# Patient Record
Sex: Female | Born: 1950
Health system: Southern US, Community
[De-identification: ages and names within clinical notes are randomized; demographics above are authoritative.]

## PROBLEM LIST (undated history)

## (undated) DIAGNOSIS — I10 Essential (primary) hypertension: Secondary | ICD-10-CM

## (undated) DIAGNOSIS — E785 Hyperlipidemia, unspecified: Secondary | ICD-10-CM

## (undated) DIAGNOSIS — I639 Cerebral infarction, unspecified: Secondary | ICD-10-CM

## (undated) DIAGNOSIS — D649 Anemia, unspecified: Secondary | ICD-10-CM

## (undated) DIAGNOSIS — R0600 Dyspnea, unspecified: Secondary | ICD-10-CM

## (undated) DIAGNOSIS — E042 Nontoxic multinodular goiter: Secondary | ICD-10-CM

## (undated) DIAGNOSIS — R600 Localized edema: Secondary | ICD-10-CM

## (undated) DIAGNOSIS — I2510 Atherosclerotic heart disease of native coronary artery without angina pectoris: Secondary | ICD-10-CM

## (undated) HISTORY — DX: Cerebral infarction, unspecified: I63.9

## (undated) HISTORY — DX: Essential (primary) hypertension: I10

## (undated) HISTORY — PX: TUBAL LIGATION: SHX77

## (undated) HISTORY — DX: Hyperlipidemia, unspecified: E78.5

---

## 2003-07-22 ENCOUNTER — Emergency Department (HOSPITAL_COMMUNITY): Admission: EM | Admit: 2003-07-22 | Discharge: 2003-07-22 | Payer: Self-pay | Admitting: Family Medicine

## 2005-05-10 HISTORY — PX: TOTAL KNEE ARTHROPLASTY: SHX125

## 2006-01-03 ENCOUNTER — Inpatient Hospital Stay (HOSPITAL_COMMUNITY): Admission: RE | Admit: 2006-01-03 | Discharge: 2006-01-07 | Payer: Self-pay | Admitting: Orthopedic Surgery

## 2009-03-09 ENCOUNTER — Observation Stay (HOSPITAL_COMMUNITY): Admission: EM | Admit: 2009-03-09 | Discharge: 2009-03-11 | Payer: Self-pay | Admitting: Emergency Medicine

## 2010-08-12 LAB — CBC
HCT: 30.1 % — ABNORMAL LOW (ref 36.0–46.0)
HCT: 32.1 % — ABNORMAL LOW (ref 36.0–46.0)
Hemoglobin: 10.6 g/dL — ABNORMAL LOW (ref 12.0–15.0)
MCHC: 35.3 g/dL (ref 30.0–36.0)
MCHC: 35.3 g/dL (ref 30.0–36.0)
MCV: 84.8 fL (ref 78.0–100.0)
MCV: 84.9 fL (ref 78.0–100.0)
Platelets: 194 10*3/uL (ref 150–400)
RBC: 3.78 MIL/uL — ABNORMAL LOW (ref 3.87–5.11)

## 2010-08-12 LAB — HEPARIN LEVEL (UNFRACTIONATED): Heparin Unfractionated: <0.1 IU/mL — ABNORMAL LOW (ref 0.30–0.70)

## 2010-08-13 LAB — HEPATIC FUNCTION PANEL
AST: 124 U/L — ABNORMAL HIGH (ref 0–37)
Albumin: 3.7 g/dL (ref 3.5–5.2)
Indirect Bilirubin: 0.5 mg/dL (ref 0.3–0.9)
Total Bilirubin: 0.7 mg/dL (ref 0.3–1.2)

## 2010-08-13 LAB — POCT I-STAT, CHEM 8
Calcium, Ion: 1.48 mmol/L — ABNORMAL HIGH (ref 1.12–1.32)
Chloride: 107 mEq/L (ref 96–112)
Creatinine, Ser: 0.9 mg/dL (ref 0.4–1.2)
Glucose, Bld: 108 mg/dL — ABNORMAL HIGH (ref 70–99)
Sodium: 142 mEq/L (ref 135–145)
TCO2: 24 mmol/L (ref 0–100)

## 2010-08-13 LAB — CARDIAC PANEL(CRET KIN+CKTOT+MB+TROPI)
Relative Index: 0.8 (ref 0.0–2.5)
Troponin I: 0.02 ng/mL (ref 0.00–0.06)

## 2010-08-13 LAB — HEPARIN LEVEL (UNFRACTIONATED): Heparin Unfractionated: 0.51 IU/mL (ref 0.30–0.70)

## 2010-08-13 LAB — POCT CARDIAC MARKERS
Myoglobin, poc: 411 ng/mL (ref 12–200)
Troponin i, poc: <0.05 ng/mL (ref 0.00–0.09)

## 2010-08-13 LAB — LIPASE, BLOOD: Lipase: 19 U/L (ref 11–59)

## 2010-08-13 LAB — LIPID PANEL
Cholesterol: 152 mg/dL (ref 0–200)
Triglycerides: 73 mg/dL (ref <150)

## 2010-09-25 NOTE — Discharge Summary (Signed)
NAMESAANYA, [name]             ACCOUNT NO.:  [id number]   MEDICAL RECORD NO.:  [id number]          PATIENT TYPE:  INP   LOCATION:  5008                         FACILITY:  [facility]   PHYSICIAN:  Loreta Ave, M.D. DATE OF BIRTH:  August 31, 1950   DATE OF ADMISSION:  01/03/2006  DATE OF DISCHARGE:  01/07/2006                                 DISCHARGE SUMMARY   FINAL DIAGNOSES:  1. Status post right total knee replacement for end stage degenerative      joint disease.  2. Hypertension, not otherwise specified.  3. Esophageal reflux disorder.  4. Obesity.   HISTORY OF PRESENT ILLNESS:  A 60 year old black female with a history of  end stage DJD right knee and chronic pain presented to our office for pre-op  evaluation for total knee replacement.  She had progressively worsening pain  with failed response with conservative treatment.  Significant decrease in  her daily activities due to the ongoing complaint.   HOSPITAL COURSE:  On January 03, 2006, the patient was taken to the [facility] operating room, and a right total knee replacement procedure was  performed.  Surgeon:  Loreta Ave, M.D.  Assistant:  Genene Churn. Denton Meek.  Anesthesia:  General.  There were no specimens.  EBL:  Minimal.  Tourniquet time:  One hour and 39 minutes.  There were no surgical or  anesthesia complications and the patient was transferred to recovery in  stable condition.  On 04 January 2006, the patient doing well with good pain control.  No  specific complaints.  Temp 99.8, pulse 99, respirations 16, blood pressure  123/70.  Hemoglobin 9.9.  Glucose 152.  INR 1.2.  Dressing clean, dry, and  intact.  Calves nontender.  Neurovascular intact distally.  Coumadin  pharmacy protocol started.  PT OT consults.  Ferrous sulfate 325 mg, one tab  p.o. b.i.d. with meals.  The patient had a normal shadow on her pre-op chest  x-ray and a chest CT ordered.  January 05, 2006, the patient doing well.  Complained of  some dizziness while  standing.  No chest pain, shortness of breath.  Vital signs stable afebrile.  Hemoglobin 8.4, hematocrit 24.3.  Glucose 141, sodium 133, potassium 4.8.  INR 1.8.  The wound looked god staples intact.  No drainage or signs of  infection.  Hemovac drain fell out over the night.  Calves nontender.  Neurovascular intact.  CT chest showed no nodules, masses, or acute  findings.  It did show an aortic and coronary artery calcifications and  thyroid goiter.  Discontinued PCA, Foley, and switched IV to Hep lock,  discontinued morphine PCA due to the patient having some dizziness.  Check a  hemoglobin A1c.  January 06, 2006, the patient doing well.  Good pain control right knee.  Over the night she was transfused 2 units of packed red blood cells due to  continued dizziness with standing.  Hemoglobin had also decreased to 7.6.  Temp 100.4, pulse 102, respirations 24, blood pressure 181/90.  INR 1.9.  Hemoglobin A1c 5.5.  CBC and B-MET pending.  Wound looked good.  Staples  intact.  No drainage or signs of infection.  Calf nontender, neurovascular  intact.  STAT UA and a urine C&S ordered.  Incentive spirometry encouraged.  Later in the day labs WBC 8.3, hemoglobin 9.8, hematocrit 28.3.  INR 1.9.  January 07, 2006, the patient doing well with good pain control.  Completed  good hall ambulation without difficulty.  No complaints of chest pain,  shortness of breath, or dizziness.  She states that she is ready to go home.  Temp 98.3, pulse 85, respirations 16, blood pressure 161/76.  WBC 6.9,  hematocrit 27.1, hemoglobin 9.6, platelets 177.  Sodium 139, potassium 4.2,  chloride 104, CO2 28, BUN 10, creatinine 0.8, glucose 101.  INR 2.1.  UA  negative.  Wound looked good.  Staples intact.  No drainage or signs of  infection.  Right calf mildly tender.  Negative Homan's.  Neurovascular  intact.  The patient ready for discharge home.   DISPOSITION:  Discharged home with family.    CONDITION:  Good and stable.   MEDICATIONS:  1. Percocet 7.5/325, one or two tabs p.o. q.4-6h. p.r.n. for pain.  2. Coumadin pharmacy protocol.  3. Robaxin 500 mg, one tab p.o. q.6 h. p.r.n. for spasm.  4. Ferrous sulfate 325 mg, one tab p.o. b.i.d. with food.   INSTRUCTIONS:  The patient will continue to work with PT and OT to improve  range of motion and strengthening of her knee.  Dressing changes daily as-  needed.  Coumadin x4 weeks post-op for DVT prophylaxis.  She will follow up  in the office two weeks post-op for recheck and possible staple removal.  Return sooner if needed.      Genene Churn. Denton Meek.      Loreta Ave, M.D.  Electronically Signed    JMO/MEDQ  D:  02/23/2006  T:  02/23/2006  Job:  [id number]

## 2010-09-25 NOTE — Op Note (Addendum)
Susan Nichols, Susan Nichols             ACCOUNT NO.:  [id number]   MEDICAL RECORD NO.:  [id number]          PATIENT TYPE:  INP   LOCATION:  2550                         FACILITY:  [facility]   PHYSICIAN:  Loreta Ave, M.D. DATE OF BIRTH:  02/13/1951   DATE OF PROCEDURE:  01/03/2006  DATE OF DISCHARGE:                                 OPERATIVE REPORT   PREOPERATIVE DIAGNOSIS:  Right knee end-stage degenerative arthritis, valgus  alignment with flexion contracture.   POSTOPERATIVE DIAGNOSIS:  Right knee end-stage degenerative arthritis,  valgus alignment with flexion contracture.   PROCEDURES:  Right total knee replacement with [employer] Triathlon  prosthesis.  Cemented #4 pegged, posterior stabilized femoral component.  Cemented #4 tibial component with a 9-mm posterior stabilized polyethylene  insert.  Cemented re-surfacing medial offset 32-mm patellar component.  Soft  tissue balancing.   SURGEON:  Loreta Ave, M.D.   ASSISTANT:  Genene Churn. Denton Meek.  Present throughout the case.   ANESTHESIA:  General.   ESTIMATED BLOOD LOSS:  Minimal.   TOURNIQUET TIME:  1 hour and 10 minutes.   SPECIMENS:  None.   CULTURES:  None.   COMPLICATIONS:  None.   DRESSING:  Self-compressive, with knee immobilizer.   DRAINS:  Hemovac x1.   PROCEDURE IN DETAIL:  The patient was brought to the operating room and  placed on the operating table in supine position.  After adequate anesthesia  had been obtained, the right knee was examined.  A 10-degree, almost 15-  degree flexion contracture.  Further flexion better than 100 degrees.  Increased valgus correctable to almost neutral mechanical axis.  Stable  ligaments.  Tethering patellofemoral joint.  Tourniquet applied, prepped and  draped in the usual sterile fashion.  Exsanguinated with elevation and  Esmarch tourniquet placed at 350 mmHg.  A straight incision above the  patella down to the tibial tubercle.  Standard medial  arthrotomy.  Her size  did not allow for a MAS approach.  The knee was exposed.  Marked grade IV  ____ QA MARKER: 88 ____ .  Ar  ligaments, all excised.  Extremely osteopenic.  Distal femoral cut,  resecting 12 mm.  Matching mechanical axis set at 5 degrees of valgus.  Sized and fitted for a #4 component with appropriate jigs.  Proximal tibial  resection, getting down to reasonable bone with appropriate resection to  overcome flexion contracture.  Resecting about 4-5 mm off the deficient  lateral side. With the extramedullary guide, 3-degree posterior slope cut.  Sized to #4 component.  All recesses cleared.  All spurs removed.  Trials  put in place, #4 in the femur, #4 in the tibia.  After soft tissue balancing  and appropriate bony resection, I had a nicely balanced knee with full  extension, full flexion, good stability, with a 9-mm insert.  The patella  was exposed, the posterior 10 mm was resected.  It was drilled and then  fitted with a 32-mm medial offset component.  With appropriate setting of  external rotation of the femoral component, I had excellent patellofemoral  tracking and good stability of patellofemoral  joint with trials.  The tibia  was marked with appropriate rotation and reamed.  All trials were removed.   Copious irrigation with the pulse irrigating device.  Cement prepared.  Placement of all components which were firmly seated.  Polyethylene attached  to the tibia.  Patellar components cemented.  Once the cement hardened, the  knee was re-examined.  Full extension, full flexion, nicely-balanced knee,  good patellofemoral tracking.  The wound was irrigated.  Hemovac placed and  brought out through a separate stab wound.  Arthrotomy closed with #1  Vicryl, skin and subcutaneous tissue with Vicryl and staples.  Knee injected  with Marcaine.  Hemovac clamped.  Sterile compressive dressing applied.  Tourniquet deflated and removed.  Knee immobilizer applied.   Anesthesia  reversed.  Brought to recovery room.  Tolerated the surgery well.  No  complications.      Loreta Ave, M.D.  Electronically Signed     DFM/MEDQ  D:  01/03/2006  T:  01/03/2006  Job:  [id number]

## 2014-03-20 ENCOUNTER — Other Ambulatory Visit (HOSPITAL_COMMUNITY): Payer: Self-pay | Admitting: Cardiovascular Disease

## 2014-03-20 DIAGNOSIS — R079 Chest pain, unspecified: Secondary | ICD-10-CM

## 2014-03-22 ENCOUNTER — Encounter (HOSPITAL_COMMUNITY)
Admission: RE | Admit: 2014-03-22 | Discharge: 2014-03-22 | Disposition: A | Discharge status: Home / Self Care | Payer: Medicare HMO | Source: Ambulatory Visit | Attending: Cardiovascular Disease | Admitting: Cardiovascular Disease

## 2014-03-22 DIAGNOSIS — R079 Chest pain, unspecified: Secondary | ICD-10-CM | POA: Diagnosis not present

## 2014-03-22 MED ORDER — REGADENOSON 0.4 MG/5ML IV SOLN
INTRAVENOUS | Status: AC
  Filled 2014-03-22: qty 5

## 2014-03-22 MED ORDER — TECHNETIUM TC 99M SESTAMIBI GENERIC - CARDIOLITE
30.0000 | Freq: Once | INTRAVENOUS | Status: AC | PRN
  Administered 2014-03-22: 30 via INTRAVENOUS

## 2014-03-22 MED ORDER — TECHNETIUM TC 99M SESTAMIBI GENERIC - CARDIOLITE
10.0000 | Freq: Once | INTRAVENOUS | Status: AC | PRN
  Administered 2014-03-22: 10 via INTRAVENOUS

## 2014-03-22 MED ORDER — REGADENOSON 0.4 MG/5ML IV SOLN
0.4000 mg | Freq: Once | INTRAVENOUS | Status: AC
  Administered 2014-03-22: 0.4 mg via INTRAVENOUS

## 2014-04-08 ENCOUNTER — Telehealth: Payer: Self-pay | Admitting: Internal Medicine

## 2014-04-08 NOTE — Telephone Encounter (Signed)
LEFT MESSAGE FOR PATIENT TO RETURN CALL TO SCHEDULE NP APPT.  °

## 2014-04-08 NOTE — Telephone Encounter (Signed)
S/W PATIENT AND GAVE NP APPT FOR 12/16 @ 11 W/DR. [place]  DX- ANEMIA  WELCOME PACKET MAILED W/CALENDAR.

## 2014-04-23 ENCOUNTER — Other Ambulatory Visit: Payer: Self-pay | Admitting: *Deleted

## 2014-04-23 DIAGNOSIS — D649 Anemia, unspecified: Secondary | ICD-10-CM

## 2014-04-24 ENCOUNTER — Ambulatory Visit: Payer: Medicare HMO

## 2014-04-24 ENCOUNTER — Other Ambulatory Visit (HOSPITAL_BASED_OUTPATIENT_CLINIC_OR_DEPARTMENT_OTHER): Payer: Medicare HMO

## 2014-04-24 ENCOUNTER — Encounter: Payer: Self-pay | Admitting: Internal Medicine

## 2014-04-24 ENCOUNTER — Ambulatory Visit (HOSPITAL_BASED_OUTPATIENT_CLINIC_OR_DEPARTMENT_OTHER): Payer: Medicare HMO | Admitting: Internal Medicine

## 2014-04-24 ENCOUNTER — Telehealth: Payer: Self-pay | Admitting: Internal Medicine

## 2014-04-24 ENCOUNTER — Other Ambulatory Visit: Payer: Self-pay | Admitting: Internal Medicine

## 2014-04-24 VITALS — BP 162/46 | HR 95 | Temp 97.5°F | Resp 22 | Ht 61.0 in | Wt 230.5 lb

## 2014-04-24 DIAGNOSIS — D539 Nutritional anemia, unspecified: Secondary | ICD-10-CM

## 2014-04-24 DIAGNOSIS — D649 Anemia, unspecified: Secondary | ICD-10-CM

## 2014-04-24 LAB — CBC & DIFF AND RETIC
BASO%: 1.2 % (ref 0.0–2.0)
BASOS ABS: 0.1 10*3/uL (ref 0.0–0.1)
EOS ABS: 0.1 10*3/uL (ref 0.0–0.5)
EOS%: 2.2 % (ref 0.0–7.0)
HCT: 34.6 % — ABNORMAL LOW (ref 34.8–46.6)
HGB: 11.3 g/dL — ABNORMAL LOW (ref 11.6–15.9)
Immature Retic Fract: 1.5 % — ABNORMAL LOW (ref 1.60–10.00)
LYMPH%: 49.8 % — AB (ref 14.0–49.7)
MCH: 27.6 pg (ref 25.1–34.0)
MCHC: 32.7 g/dL (ref 31.5–36.0)
MCV: 84.4 fL (ref 79.5–101.0)
MONO#: 0.3 10*3/uL (ref 0.1–0.9)
MONO%: 7.8 % (ref 0.0–14.0)
NEUT%: 39 % (ref 38.4–76.8)
NEUTROS ABS: 1.6 10*3/uL (ref 1.5–6.5)
Platelets: 202 10*3/uL (ref 145–400)
RBC: 4.1 10*6/uL (ref 3.70–5.45)
RDW: 14.1 % (ref 11.2–14.5)
RETIC %: 1.1 % (ref 0.70–2.10)
RETIC CT ABS: 45.1 10*3/uL (ref 33.70–90.70)
WBC: 4.1 10*3/uL (ref 3.9–10.3)
lymph#: 2 10*3/uL (ref 0.9–3.3)

## 2014-04-24 LAB — COMPREHENSIVE METABOLIC PANEL (CC13)
ALT: 13 U/L (ref 0–55)
ANION GAP: 10 meq/L (ref 3–11)
AST: 19 U/L (ref 5–34)
Albumin: 3.8 g/dL (ref 3.5–5.0)
Alkaline Phosphatase: 79 U/L (ref 40–150)
BUN: 25.2 mg/dL (ref 7.0–26.0)
CALCIUM: 11.3 mg/dL — AB (ref 8.4–10.4)
CO2: 23 meq/L (ref 22–29)
CREATININE: 1.3 mg/dL — AB (ref 0.6–1.1)
Chloride: 108 mEq/L (ref 98–109)
EGFR: 52 mL/min/{1.73_m2} — AB (ref >90)
Glucose: 102 mg/dl (ref 70–140)
Potassium: 3.9 mEq/L (ref 3.5–5.1)
Sodium: 141 mEq/L (ref 136–145)
Total Bilirubin: 0.49 mg/dL (ref 0.20–1.20)
Total Protein: 7.4 g/dL (ref 6.4–8.3)

## 2014-04-24 LAB — IRON AND TIBC CHCC
%SAT: 21 % (ref 21–57)
IRON: 54 ug/dL (ref 41–142)
TIBC: 254 ug/dL (ref 236–444)
UIBC: 200 ug/dL (ref 120–384)

## 2014-04-24 LAB — FERRITIN CHCC: Ferritin: 165 ng/ml (ref 9–269)

## 2014-04-24 LAB — LACTATE DEHYDROGENASE (CC13): LDH: 192 U/L (ref 125–245)

## 2014-04-24 NOTE — Progress Notes (Signed)
Checked in new patient with no issues prior to seeing the dr. She has appt card and no traveling.

## 2014-04-24 NOTE — Telephone Encounter (Signed)
lvm for pt regarding to Jan 2016 appt....mailed pt appt sched/avs and letter °

## 2014-04-24 NOTE — Progress Notes (Signed)
[place] [phone]   [phone]  CONSULT NOTE  REFERRING PHYSICIAN: Dr. Lucianne Lei  REASON FOR CONSULTATION:  63 years old African-American female with persistent anemia  HPI Susan Nichols is a 63 y.o. female with past medical history significant for hypertension, osteoarthritis, anxiety and long history of anemia. The patient was seen recently by her primary care physician for routine evaluation and she was complaining about feeling cold all the time. CBC on 03/10/2014 showed hemoglobin of 10.8 and hematocrit 32.6%. The patient has normal white blood count of 4.0 and normal platelets count 228,000. Iron study performed at that time showed serum iron of 62, total iron binding capacity 272 with iron saturation 27%, blasts and vitamin B12 319 and serum folate 12.9. Ferritin level was normal at 129. The patient was referred to me today for further evaluation and recommendation regarding her persistent anemia. Her last colonoscopy was performed 12 years ago and the patient is expected to have repeat colonoscopy in the next few months. She denied having any significant fatigue or weakness. She has no bleeding or bruises. She has no dizzy spells. She does not take any iron supplements.  The patient denied having any significant chest pain, shortness breath except with exertion, cough or hemoptysis. She has no weight loss or night sweats. Family history significant for a father with heart disease and hypertension, mother still alive at age 47 and has heart disease and hypertension as well as a stroke. The patient is married and has 2 children. She used to work at [employer]. She is currently on disability. She has no history of smoking, alcohol or drug abuse. HPI  Past Medical History  Diagnosis Date  . Hypertension   . Hyperlipemia     Past Surgical History  Procedure Laterality Date  . Total knee arthroplasty  2007    right knee     History reviewed. No pertinent family history.  Social History History  Substance Use Topics  . Smoking status: Never Smoker   . Smokeless tobacco: Not on file  . Alcohol Use: No    No Known Allergies  Current Outpatient Prescriptions  Medication Sig Dispense Refill  . ALPRAZolam (XANAX) 0.25 MG tablet Take 1 tablet by mouth daily as needed.  3  . amLODipine (NORVASC) 10 MG tablet Take 10 mg by mouth daily.  3  . atenolol (TENORMIN) 50 MG tablet Take 50 mg by mouth daily.  4  . BIDIL 20-37.5 MG per tablet Take 1 tablet by mouth daily.  6  . cloNIDine (CATAPRES) 0.2 MG tablet Take 0.2 mg by mouth daily.  6  . furosemide (LASIX) 20 MG tablet Take 20 mg by mouth. unsure of dose    . HYDROcodone-acetaminophen (NORCO/VICODIN) 5-325 MG per tablet Take 1 tablet by mouth.  0  . lisinopril (PRINIVIL,ZESTRIL) 20 MG tablet Take 20 mg by mouth daily.  6  . potassium chloride (MICRO-K) 10 MEQ CR capsule Take 10 mEq by mouth daily.  2  . simvastatin (ZOCOR) 20 MG tablet Take 20 mg by mouth daily.     No current facility-administered medications for this visit.    Review of Systems  Constitutional: negative Eyes: negative Ears, nose, mouth, throat, and face: negative Respiratory: negative Cardiovascular: negative Gastrointestinal: negative Genitourinary:negative Integument/breast: negative Hematologic/lymphatic: negative Musculoskeletal:negative Neurological: negative Behavioral/Psych: negative Endocrine: negative Allergic/Immunologic: negative  Physical Exam  UYE:BXIDH, healthy, no distress, well nourished and well developed SKIN: skin color, texture, turgor are  normal, no rashes or significant lesions HEAD: Normocephalic, No masses, lesions, tenderness or abnormalities EYES: normal, PERRLA EARS: External ears normal, Canals clear OROPHARYNX:no exudate, no erythema and lips, buccal mucosa, and tongue normal  NECK: supple, no adenopathy, no JVD LYMPH:  no palpable  lymphadenopathy, no hepatosplenomegaly BREAST:not examined LUNGS: clear to auscultation , and palpation HEART: regular rate & rhythm and no murmurs ABDOMEN:abdomen soft, non-tender, normal bowel sounds and no masses or organomegaly BACK: Back symmetric, no curvature., No CVA tenderness EXTREMITIES:no joint deformities, effusion, or inflammation, no edema, no skin discoloration  NEURO: alert & oriented x 3 with fluent speech, no focal motor/sensory deficits  PERFORMANCE STATUS: ECOG 1  LABORATORY DATA: Lab Results  Component Value Date   WBC 4.1 04/24/2014   HGB 11.3* 04/24/2014   HCT 34.6* 04/24/2014   MCV 84.4 04/24/2014   PLT 202 04/24/2014      Chemistry      Component Value Date/Time   NA 142 03/09/2009 0533   K 3.7 03/09/2009 0533   CL 107 03/09/2009 0533   BUN 19 03/09/2009 0533   CREATININE 0.9 03/09/2009 0533      Component Value Date/Time   ALKPHOS 112 03/09/2009 0625   AST 124* 03/09/2009 0625   ALT 58* 03/09/2009 0625   BILITOT 0.7 03/09/2009 0625       RADIOGRAPHIC STUDIES: No results found.  ASSESSMENT: This is a very pleasant 63 years old African-American female with persistent mild anemia of unclear etiology likely secondary to nutritional deficiency versus myelodysplasia   PLAN: I had a lengthy discussion with the patient today about her current condition and further investigation to confirm the diagnosis. I'll order several studies including repeat CBC, comprehensive metabolic panel, LDH, serum ferritin, iron study, serum folate, serum vitamin B12 level, serum erythropoietin as well as serum protein electrophoreses. I will see the patient back for follow-up visit in 3 weeks for reevaluation and discussion of her lab results. If no clear etiology proceeding with a bone marrow biopsy and aspirate versus continuous monitoring and close observation. The patient agreed to the current plan.  The patient voices understanding of current disease status and  treatment options and is in agreement with the current care plan.  All questions were answered. The patient knows to call the clinic with any problems, questions or concerns. We can certainly see the patient much sooner if necessary.  Thank you so much for allowing me to participate in the care of [facility]. I will continue to follow up the patient with you and assist in her care.  I spent 40 minutes counseling the patient face to face. The total time spent in the appointment was 55 minutes.  Disclaimer: This note was dictated with voice recognition software. Similar sounding words can inadvertently be transcribed and may not be corrected upon review.   Emanuell Morina K. 04/24/2014, 12:21 PM

## 2014-04-26 LAB — VITAMIN B12: Vitamin B-12: 388 pg/mL (ref 211–911)

## 2014-04-26 LAB — PROTEIN ELECTROPHORESIS, SERUM, WITH REFLEX
ALBUMIN ELP: 52.2 % — AB (ref 55.8–66.1)
Alpha-1-Globulin: 5.2 % — ABNORMAL HIGH (ref 2.9–4.9)
Alpha-2-Globulin: 11.2 % (ref 7.1–11.8)
BETA 2: 6.6 % — AB (ref 3.2–6.5)
BETA GLOBULIN: 5.6 % (ref 4.7–7.2)
Gamma Globulin: 19.2 % — ABNORMAL HIGH (ref 11.1–18.8)
Total Protein, Serum Electrophoresis: 7.7 g/dL (ref 6.0–8.3)

## 2014-04-26 LAB — ERYTHROPOIETIN: ERYTHROPOIETIN: 15 m[IU]/mL (ref 2.6–18.5)

## 2014-04-26 LAB — FOLATE RBC: RBC Folate: 576 ng/mL (ref >280)

## 2014-05-15 ENCOUNTER — Ambulatory Visit: Payer: Medicare HMO | Admitting: Internal Medicine

## 2014-05-15 ENCOUNTER — Other Ambulatory Visit: Payer: Medicare HMO

## 2014-05-17 ENCOUNTER — Other Ambulatory Visit: Payer: Self-pay | Admitting: *Deleted

## 2014-05-17 ENCOUNTER — Telehealth: Payer: Self-pay | Admitting: Internal Medicine

## 2014-05-17 NOTE — Telephone Encounter (Signed)
lvm fo rpt regarding to Jan appt....mailed pt appt sched and letter °

## 2014-06-06 ENCOUNTER — Telehealth: Payer: Self-pay | Admitting: Internal Medicine

## 2014-06-06 ENCOUNTER — Ambulatory Visit (HOSPITAL_BASED_OUTPATIENT_CLINIC_OR_DEPARTMENT_OTHER): Payer: Commercial Managed Care - HMO | Admitting: Internal Medicine

## 2014-06-06 ENCOUNTER — Other Ambulatory Visit (HOSPITAL_BASED_OUTPATIENT_CLINIC_OR_DEPARTMENT_OTHER): Payer: Commercial Managed Care - HMO

## 2014-06-06 ENCOUNTER — Encounter: Payer: Self-pay | Admitting: Internal Medicine

## 2014-06-06 VITALS — BP 184/52 | HR 81 | Temp 98.1°F | Resp 18 | Ht 61.0 in | Wt 227.9 lb

## 2014-06-06 DIAGNOSIS — I10 Essential (primary) hypertension: Secondary | ICD-10-CM | POA: Diagnosis not present

## 2014-06-06 DIAGNOSIS — D649 Anemia, unspecified: Secondary | ICD-10-CM

## 2014-06-06 DIAGNOSIS — D539 Nutritional anemia, unspecified: Secondary | ICD-10-CM

## 2014-06-06 LAB — CBC WITH DIFFERENTIAL/PLATELET
BASO%: 1.9 % (ref 0.0–2.0)
Basophils Absolute: 0.1 10e3/uL (ref 0.0–0.1)
EOS%: 2.2 % (ref 0.0–7.0)
Eosinophils Absolute: 0.1 10e3/uL (ref 0.0–0.5)
HCT: 35.5 % (ref 34.8–46.6)
HGB: 11.4 g/dL — ABNORMAL LOW (ref 11.6–15.9)
LYMPH%: 38.2 % (ref 14.0–49.7)
MCH: 27.2 pg (ref 25.1–34.0)
MCHC: 32.2 g/dL (ref 31.5–36.0)
MCV: 84.4 fL (ref 79.5–101.0)
MONO#: 0.6 10e3/uL (ref 0.1–0.9)
MONO%: 10.4 % (ref 0.0–14.0)
NEUT#: 2.5 10e3/uL (ref 1.5–6.5)
NEUT%: 47.3 % (ref 38.4–76.8)
Platelets: 249 10e3/uL (ref 145–400)
RBC: 4.2 10e6/uL (ref 3.70–5.45)
RDW: 14.9 % — ABNORMAL HIGH (ref 11.2–14.5)
WBC: 5.3 10e3/uL (ref 3.9–10.3)
lymph#: 2 10e3/uL (ref 0.9–3.3)

## 2014-06-06 NOTE — Telephone Encounter (Signed)
, °

## 2014-06-06 NOTE — Progress Notes (Signed)
[place] Telephone:([phone]   Fax:(336) [place], [place] [address] [place] [address]  DIAGNOSIS: Mild anemia patient for anemia of chronic disease plus/minus iron deficiency  PRIOR THERAPY: None  CURRENT THERAPY:  INTERVAL HISTORY: Susan Nichols 65 y.o. female returns to the clinic today for follow-up visit. The patient was seen a few weeks ago for evaluation of her mild anemia. I had several studies performed at that time including serum erythropoietin that was normal at 15.0, serum folate 576, vitamin B 12 level 388. Serum protein electrophoreses showed no specific abnormality. Serum ferritin was 165, serum iron 54, total iron binding capacity 254 and iron sat induration 21%. The patient is feeling fine today except for mild fatigue. She denied having any significant weight loss or night sweats. She denied having any chest pain, shortness breath, cough or hemoptysis. She had repeat CBC and she is here for evaluation and discussion of her lab results. She is currently taking over-the-counter iron tablets.   MEDICAL HISTORY: Past Medical History  Diagnosis Date  . Hypertension   . Hyperlipemia     ALLERGIES:  has No Known Allergies.  MEDICATIONS:  Current Outpatient Prescriptions  Medication Sig Dispense Refill  . ALPRAZolam (XANAX) 0.25 MG tablet Take 1 tablet by mouth daily as needed.  3  . amLODipine (NORVASC) 10 MG tablet Take 10 mg by mouth daily.  3  . atenolol (TENORMIN) 50 MG tablet Take 50 mg by mouth daily.  4  . cloNIDine (CATAPRES) 0.2 MG tablet Take 0.2 mg by mouth daily.  6  . furosemide (LASIX) 20 MG tablet Take 20 mg by mouth. unsure of dose    . HYDROcodone-acetaminophen (NORCO/VICODIN) 5-325 MG per tablet Take 1 tablet by mouth.  0  . lisinopril (PRINIVIL,ZESTRIL) 20 MG tablet Take 20 mg by mouth daily.  6  . potassium chloride (MICRO-K) 10 MEQ CR capsule Take 10 mEq by mouth daily.  2    . simvastatin (ZOCOR) 20 MG tablet Take 20 mg by mouth daily.    Marland Kitchen BIDIL 20-37.5 MG per tablet Take 1 tablet by mouth daily.  6  . chlorthalidone (HYGROTON) 25 MG tablet Take 25 mg by mouth daily.  1   No current facility-administered medications for this visit.    SURGICAL HISTORY:  Past Surgical History  Procedure Laterality Date  . Total knee arthroplasty  2007    right knee    REVIEW OF SYSTEMS:  A comprehensive review of systems was negative.   PHYSICAL EXAMINATION: General appearance: alert, cooperative and no distress Head: Normocephalic, without obvious abnormality, atraumatic Neck: no adenopathy, no JVD, supple, symmetrical, trachea midline and thyroid not enlarged, symmetric, no tenderness/mass/nodules Lymph nodes: Cervical, supraclavicular, and axillary nodes normal. Resp: clear to auscultation bilaterally Back: symmetric, no curvature. ROM normal. No CVA tenderness. Cardio: regular rate and rhythm, S1, S2 normal, no murmur, click, rub or gallop GI: soft, non-tender; bowel sounds normal; no masses,  no organomegaly Extremities: extremities normal, atraumatic, no cyanosis or edema  ECOG PERFORMANCE STATUS: 1 - Symptomatic but completely ambulatory  Blood pressure 184/52, pulse 81, temperature 98.1 F (36.7 C), temperature source Oral, resp. rate 18, height 5\' 1"  (1.549 m), weight 227 lb 14.4 oz (103.375 kg), SpO2 100 %.  LABORATORY DATA: Lab Results  Component Value Date   WBC 5.3 06/06/2014   HGB 11.4* 06/06/2014   HCT 35.5 06/06/2014   MCV 84.4  06/06/2014   PLT 249 06/06/2014      Chemistry      Component Value Date/Time   NA 141 04/24/2014 1115   NA 142 03/09/2009 0533   K 3.9 04/24/2014 1115   K 3.7 03/09/2009 0533   CL 107 03/09/2009 0533   CO2 23 04/24/2014 1115   BUN 25.2 04/24/2014 1115   BUN 19 03/09/2009 0533   CREATININE 1.3* 04/24/2014 1115   CREATININE 0.9 03/09/2009 0533      Component Value Date/Time   CALCIUM 11.3* 04/24/2014 1115    ALKPHOS 79 04/24/2014 1115   ALKPHOS 112 03/09/2009 0625   AST 19 04/24/2014 1115   AST 124* 03/09/2009 0625   ALT 13 04/24/2014 1115   ALT 58* 03/09/2009 0625   BILITOT 0.49 04/24/2014 1115   BILITOT 0.7 03/09/2009 0625       RADIOGRAPHIC STUDIES: No results found.  ASSESSMENT AND PLAN: This is a very pleasant 64 years old African-American female with mild anemia most likely anemia of chronic disease plus/minus iron deficiency. The patient is feeling fine today and her hemoglobin is 11.4. I recommended for her to continue on oral iron tablets for now. I will continue to monitor her closely with repeat CBC, iron study and ferritin in 3 months. For hypertension, the patient was very anxious today and did not take her blood pressure medicine this morning. I strongly recommended for her to take her blood pressure medication as soon as she goes home and also to contact her primary care physician for evaluation and adjustment of her antihypertensive medication. The patient was advised to call immediately if she has any concerning symptoms in the interval. The patient voices understanding of current disease status and treatment options and is in agreement with the current care plan.  All questions were answered. The patient knows to call the clinic with any problems, questions or concerns. We can certainly see the patient much sooner if necessary.  Disclaimer: This note was dictated with voice recognition software. Similar sounding words can inadvertently be transcribed and may not be corrected upon review.

## 2014-07-18 DIAGNOSIS — E784 Other hyperlipidemia: Secondary | ICD-10-CM | POA: Diagnosis not present

## 2014-07-18 DIAGNOSIS — I10 Essential (primary) hypertension: Secondary | ICD-10-CM | POA: Diagnosis not present

## 2014-07-18 DIAGNOSIS — F419 Anxiety disorder, unspecified: Secondary | ICD-10-CM | POA: Diagnosis not present

## 2014-07-18 DIAGNOSIS — E669 Obesity, unspecified: Secondary | ICD-10-CM | POA: Diagnosis not present

## 2014-08-06 DIAGNOSIS — D649 Anemia, unspecified: Secondary | ICD-10-CM | POA: Diagnosis not present

## 2014-08-06 DIAGNOSIS — I10 Essential (primary) hypertension: Secondary | ICD-10-CM | POA: Diagnosis not present

## 2014-08-06 DIAGNOSIS — E780 Pure hypercholesterolemia: Secondary | ICD-10-CM | POA: Diagnosis not present

## 2014-08-06 DIAGNOSIS — M1711 Unilateral primary osteoarthritis, right knee: Secondary | ICD-10-CM | POA: Diagnosis not present

## 2014-09-04 ENCOUNTER — Other Ambulatory Visit (HOSPITAL_BASED_OUTPATIENT_CLINIC_OR_DEPARTMENT_OTHER): Payer: Commercial Managed Care - HMO

## 2014-09-04 DIAGNOSIS — D649 Anemia, unspecified: Secondary | ICD-10-CM | POA: Diagnosis not present

## 2014-09-04 DIAGNOSIS — D539 Nutritional anemia, unspecified: Secondary | ICD-10-CM

## 2014-09-04 LAB — CBC WITH DIFFERENTIAL/PLATELET
BASO%: 1.1 % (ref 0.0–2.0)
BASOS ABS: 0.1 10*3/uL (ref 0.0–0.1)
EOS%: 2.1 % (ref 0.0–7.0)
Eosinophils Absolute: 0.1 10*3/uL (ref 0.0–0.5)
HCT: 33.7 % — ABNORMAL LOW (ref 34.8–46.6)
HEMOGLOBIN: 11.1 g/dL — AB (ref 11.6–15.9)
LYMPH%: 35.3 % (ref 14.0–49.7)
MCH: 27.9 pg (ref 25.1–34.0)
MCHC: 32.9 g/dL (ref 31.5–36.0)
MCV: 84.7 fL (ref 79.5–101.0)
MONO#: 0.5 10*3/uL (ref 0.1–0.9)
MONO%: 9.5 % (ref 0.0–14.0)
NEUT#: 2.7 10*3/uL (ref 1.5–6.5)
NEUT%: 52 % (ref 38.4–76.8)
PLATELETS: 208 10*3/uL (ref 145–400)
RBC: 3.98 10*6/uL (ref 3.70–5.45)
RDW: 13.8 % (ref 11.2–14.5)
WBC: 5.3 10*3/uL (ref 3.9–10.3)
lymph#: 1.9 10*3/uL (ref 0.9–3.3)

## 2014-09-04 LAB — IRON AND TIBC CHCC
%SAT: 17 % — ABNORMAL LOW (ref 21–57)
IRON: 40 ug/dL — AB (ref 41–142)
TIBC: 234 ug/dL — ABNORMAL LOW (ref 236–444)
UIBC: 194 ug/dL (ref 120–384)

## 2014-09-04 LAB — FERRITIN CHCC: Ferritin: 139 ng/ml (ref 9–269)

## 2014-09-10 DIAGNOSIS — D649 Anemia, unspecified: Secondary | ICD-10-CM | POA: Diagnosis not present

## 2014-09-10 DIAGNOSIS — E785 Hyperlipidemia, unspecified: Secondary | ICD-10-CM | POA: Diagnosis not present

## 2014-09-10 DIAGNOSIS — M1711 Unilateral primary osteoarthritis, right knee: Secondary | ICD-10-CM | POA: Diagnosis not present

## 2014-09-10 DIAGNOSIS — I10 Essential (primary) hypertension: Secondary | ICD-10-CM | POA: Diagnosis not present

## 2014-09-11 ENCOUNTER — Telehealth: Payer: Self-pay | Admitting: Internal Medicine

## 2014-09-11 ENCOUNTER — Encounter: Payer: Self-pay | Admitting: Internal Medicine

## 2014-09-11 ENCOUNTER — Ambulatory Visit (HOSPITAL_BASED_OUTPATIENT_CLINIC_OR_DEPARTMENT_OTHER): Payer: Commercial Managed Care - HMO | Admitting: Internal Medicine

## 2014-09-11 VITALS — BP 189/53 | HR 96 | Temp 98.4°F | Resp 18 | Ht 61.0 in | Wt 229.7 lb

## 2014-09-11 DIAGNOSIS — D539 Nutritional anemia, unspecified: Secondary | ICD-10-CM

## 2014-09-11 DIAGNOSIS — D509 Iron deficiency anemia, unspecified: Secondary | ICD-10-CM | POA: Diagnosis not present

## 2014-09-11 NOTE — Progress Notes (Signed)
[facility] [phone]   [phone]  OFFICE PROGRESS NOTE  Elyn Peers, MD [address] [place] [address]  DIAGNOSIS: Mild anemia patient for anemia of chronic disease plus/minus iron deficiency  PRIOR THERAPY: None  CURRENT THERAPY: Over-the-counter Oral iron tablets once a day.  INTERVAL HISTORY: Susan Nichols 64 y.o. female returns to the clinic today for follow-up visit. The patient is feeling fine today except for mild fatigue. She denied having any significant weight loss or night sweats. She denied having any chest pain, shortness breath, cough or hemoptysis. She had repeat CBC, iron study and ferritin and she is here for evaluation and discussion of her lab results. She is currently taking over-the-counter iron tablets, only once a day.    MEDICAL HISTORY: Past Medical History  Diagnosis Date  . Hypertension   . Hyperlipemia     ALLERGIES:  has No Known Allergies.  MEDICATIONS:  Current Outpatient Prescriptions  Medication Sig Dispense Refill  . ALPRAZolam (XANAX) 0.25 MG tablet Take 1 tablet by mouth daily as needed.  3  . amLODipine (NORVASC) 10 MG tablet Take 10 mg by mouth daily.  3  . atenolol (TENORMIN) 50 MG tablet Take 50 mg by mouth daily.  4  . BIDIL 20-37.5 MG per tablet Take 1 tablet by mouth daily.  6  . chlorthalidone (HYGROTON) 25 MG tablet Take 25 mg by mouth daily.  1  . cloNIDine (CATAPRES) 0.2 MG tablet Take 0.2 mg by mouth daily.  6  . furosemide (LASIX) 20 MG tablet Take 20 mg by mouth. unsure of dose    . HYDROcodone-acetaminophen (NORCO/VICODIN) 5-325 MG per tablet Take 1 tablet by mouth.  0  . lisinopril (PRINIVIL,ZESTRIL) 20 MG tablet Take 20 mg by mouth daily.  6  . potassium chloride (MICRO-K) 10 MEQ CR capsule Take 10 mEq by mouth daily.  2  . simvastatin (ZOCOR) 20 MG tablet Take 20 mg by mouth daily.    . simvastatin (ZOCOR) 40 MG tablet   3   No current facility-administered  medications for this visit.    SURGICAL HISTORY:  Past Surgical History  Procedure Laterality Date  . Total knee arthroplasty  2007    right knee    REVIEW OF SYSTEMS:  A comprehensive review of systems was negative except for: Constitutional: positive for fatigue   PHYSICAL EXAMINATION: General appearance: alert, cooperative and no distress Head: Normocephalic, without obvious abnormality, atraumatic Neck: no adenopathy, no JVD, supple, symmetrical, trachea midline and thyroid not enlarged, symmetric, no tenderness/mass/nodules Lymph nodes: Cervical, supraclavicular, and axillary nodes normal. Resp: clear to auscultation bilaterally Back: symmetric, no curvature. ROM normal. No CVA tenderness. Cardio: regular rate and rhythm, S1, S2 normal, no murmur, click, rub or gallop GI: soft, non-tender; bowel sounds normal; no masses,  no organomegaly Extremities: extremities normal, atraumatic, no cyanosis or edema  ECOG PERFORMANCE STATUS: 1 - Symptomatic but completely ambulatory  Blood pressure 189/53, pulse 96, temperature 98.4 F (36.9 C), temperature source Oral, resp. rate 18, height 5\' 1"  (1.549 m), weight 229 lb 11.2 oz (104.191 kg), SpO2 99 %.  LABORATORY DATA: Lab Results  Component Value Date   WBC 5.3 09/04/2014   HGB 11.1* 09/04/2014   HCT 33.7* 09/04/2014   MCV 84.7 09/04/2014   PLT 208 09/04/2014      Chemistry      Component Value Date/Time   NA 141 04/24/2014 1115   NA 142 03/09/2009  0533   K 3.9 04/24/2014 1115   K 3.7 03/09/2009 0533   CL 107 03/09/2009 0533   CO2 23 04/24/2014 1115   BUN 25.2 04/24/2014 1115   BUN 19 03/09/2009 0533   CREATININE 1.3* 04/24/2014 1115   CREATININE 0.9 03/09/2009 0533      Component Value Date/Time   CALCIUM 11.3* 04/24/2014 1115   ALKPHOS 79 04/24/2014 1115   ALKPHOS 112 03/09/2009 0625   AST 19 04/24/2014 1115   AST 124* 03/09/2009 0625   ALT 13 04/24/2014 1115   ALT 58* 03/09/2009 0625   BILITOT 0.49 04/24/2014  1115   BILITOT 0.7 03/09/2009 0625       RADIOGRAPHIC STUDIES: No results found.  ASSESSMENT AND PLAN: This is a very pleasant 64 years old African-American female with mild anemia most likely anemia of chronic disease plus/minus iron deficiency. The patient is feeling fine today and her hemoglobin is 11.1. The iron study also showed decrease in the serum iron and iron saturation. I had a lengthy discussion with the patient today about her condition. I gave her the option of continuing oral iron tablets with ferrous sulfate but increase the dose to 3 times a day with vitamin C or orange juice. I also strongly recommended for the patient to avoid drinking tea when she takes her iron tablets to avoid malabsorption of the iron. I also gave her the option of proceeding with intravenous iron infusion was Feraheme. The patient would like to continue on the oral iron tablets for now. I will continue to monitor her closely with repeat CBC, iron study and ferritin in 3 months. The patient was advised to call immediately if she has any concerning symptoms in the interval. The patient voices understanding of current disease status and treatment options and is in agreement with the current care plan.  All questions were answered. The patient knows to call the clinic with any problems, questions or concerns. We can certainly see the patient much sooner if necessary.  Disclaimer: This note was dictated with voice recognition software. Similar sounding words can inadvertently be transcribed and may not be corrected upon review.

## 2014-09-11 NOTE — Telephone Encounter (Signed)
per pof to sch pt appt-gave pt copy of sch °

## 2014-09-17 DIAGNOSIS — I10 Essential (primary) hypertension: Secondary | ICD-10-CM | POA: Diagnosis not present

## 2014-09-17 DIAGNOSIS — E784 Other hyperlipidemia: Secondary | ICD-10-CM | POA: Diagnosis not present

## 2014-09-17 DIAGNOSIS — E559 Vitamin D deficiency, unspecified: Secondary | ICD-10-CM | POA: Diagnosis not present

## 2014-09-17 DIAGNOSIS — E6609 Other obesity due to excess calories: Secondary | ICD-10-CM | POA: Diagnosis not present

## 2014-10-17 DIAGNOSIS — M25561 Pain in right knee: Secondary | ICD-10-CM | POA: Diagnosis not present

## 2014-10-17 DIAGNOSIS — I10 Essential (primary) hypertension: Secondary | ICD-10-CM | POA: Diagnosis not present

## 2014-10-17 DIAGNOSIS — E784 Other hyperlipidemia: Secondary | ICD-10-CM | POA: Diagnosis not present

## 2014-10-17 DIAGNOSIS — E6609 Other obesity due to excess calories: Secondary | ICD-10-CM | POA: Diagnosis not present

## 2014-12-06 ENCOUNTER — Telehealth: Payer: Self-pay | Admitting: Internal Medicine

## 2014-12-06 NOTE — Telephone Encounter (Signed)
returned call and cx lab on 8.1.Marland KitchenMarland Kitchenpt does not feel she needs to come here...she will see pcp on 8.2 and will r/s is needed

## 2014-12-09 ENCOUNTER — Other Ambulatory Visit: Payer: Commercial Managed Care - HMO

## 2014-12-10 DIAGNOSIS — M1711 Unilateral primary osteoarthritis, right knee: Secondary | ICD-10-CM | POA: Diagnosis not present

## 2014-12-10 DIAGNOSIS — E559 Vitamin D deficiency, unspecified: Secondary | ICD-10-CM | POA: Diagnosis not present

## 2014-12-10 DIAGNOSIS — I10 Essential (primary) hypertension: Secondary | ICD-10-CM | POA: Diagnosis not present

## 2014-12-10 DIAGNOSIS — D510 Vitamin B12 deficiency anemia due to intrinsic factor deficiency: Secondary | ICD-10-CM | POA: Diagnosis not present

## 2014-12-10 DIAGNOSIS — B159 Hepatitis A without hepatic coma: Secondary | ICD-10-CM | POA: Diagnosis not present

## 2014-12-10 DIAGNOSIS — D649 Anemia, unspecified: Secondary | ICD-10-CM | POA: Diagnosis not present

## 2014-12-10 DIAGNOSIS — E785 Hyperlipidemia, unspecified: Secondary | ICD-10-CM | POA: Diagnosis not present

## 2014-12-16 ENCOUNTER — Ambulatory Visit: Payer: Commercial Managed Care - HMO | Admitting: Internal Medicine

## 2014-12-24 DIAGNOSIS — I10 Essential (primary) hypertension: Secondary | ICD-10-CM | POA: Diagnosis not present

## 2014-12-24 DIAGNOSIS — E784 Other hyperlipidemia: Secondary | ICD-10-CM | POA: Diagnosis not present

## 2014-12-24 DIAGNOSIS — D500 Iron deficiency anemia secondary to blood loss (chronic): Secondary | ICD-10-CM | POA: Diagnosis not present

## 2014-12-24 DIAGNOSIS — E6609 Other obesity due to excess calories: Secondary | ICD-10-CM | POA: Diagnosis not present

## 2015-02-25 DIAGNOSIS — D500 Iron deficiency anemia secondary to blood loss (chronic): Secondary | ICD-10-CM | POA: Diagnosis not present

## 2015-02-25 DIAGNOSIS — E6609 Other obesity due to excess calories: Secondary | ICD-10-CM | POA: Diagnosis not present

## 2015-02-25 DIAGNOSIS — E784 Other hyperlipidemia: Secondary | ICD-10-CM | POA: Diagnosis not present

## 2015-02-25 DIAGNOSIS — E559 Vitamin D deficiency, unspecified: Secondary | ICD-10-CM | POA: Diagnosis not present

## 2015-02-25 DIAGNOSIS — I10 Essential (primary) hypertension: Secondary | ICD-10-CM | POA: Diagnosis not present

## 2015-04-09 DIAGNOSIS — Z23 Encounter for immunization: Secondary | ICD-10-CM | POA: Diagnosis not present

## 2015-04-09 DIAGNOSIS — E785 Hyperlipidemia, unspecified: Secondary | ICD-10-CM | POA: Diagnosis not present

## 2015-04-09 DIAGNOSIS — I10 Essential (primary) hypertension: Secondary | ICD-10-CM | POA: Diagnosis not present

## 2015-04-29 DIAGNOSIS — E6609 Other obesity due to excess calories: Secondary | ICD-10-CM | POA: Diagnosis not present

## 2015-04-29 DIAGNOSIS — J00 Acute nasopharyngitis [common cold]: Secondary | ICD-10-CM | POA: Diagnosis not present

## 2015-04-29 DIAGNOSIS — I2510 Atherosclerotic heart disease of native coronary artery without angina pectoris: Secondary | ICD-10-CM | POA: Diagnosis not present

## 2015-04-29 DIAGNOSIS — E784 Other hyperlipidemia: Secondary | ICD-10-CM | POA: Diagnosis not present

## 2015-04-29 DIAGNOSIS — E559 Vitamin D deficiency, unspecified: Secondary | ICD-10-CM | POA: Diagnosis not present

## 2015-04-29 DIAGNOSIS — I10 Essential (primary) hypertension: Secondary | ICD-10-CM | POA: Diagnosis not present

## 2015-07-01 DIAGNOSIS — I10 Essential (primary) hypertension: Secondary | ICD-10-CM | POA: Diagnosis not present

## 2015-07-01 DIAGNOSIS — E559 Vitamin D deficiency, unspecified: Secondary | ICD-10-CM | POA: Diagnosis not present

## 2015-07-01 DIAGNOSIS — E784 Other hyperlipidemia: Secondary | ICD-10-CM | POA: Diagnosis not present

## 2015-07-01 DIAGNOSIS — E6609 Other obesity due to excess calories: Secondary | ICD-10-CM | POA: Diagnosis not present

## 2015-09-02 DIAGNOSIS — E6609 Other obesity due to excess calories: Secondary | ICD-10-CM | POA: Diagnosis not present

## 2015-09-02 DIAGNOSIS — I340 Nonrheumatic mitral (valve) insufficiency: Secondary | ICD-10-CM | POA: Diagnosis not present

## 2015-09-02 DIAGNOSIS — I10 Essential (primary) hypertension: Secondary | ICD-10-CM | POA: Diagnosis not present

## 2015-09-02 DIAGNOSIS — E559 Vitamin D deficiency, unspecified: Secondary | ICD-10-CM | POA: Diagnosis not present

## 2015-09-02 DIAGNOSIS — E784 Other hyperlipidemia: Secondary | ICD-10-CM | POA: Diagnosis not present

## 2015-11-03 DIAGNOSIS — E784 Other hyperlipidemia: Secondary | ICD-10-CM | POA: Diagnosis not present

## 2015-11-03 DIAGNOSIS — E559 Vitamin D deficiency, unspecified: Secondary | ICD-10-CM | POA: Diagnosis not present

## 2015-11-03 DIAGNOSIS — E6609 Other obesity due to excess calories: Secondary | ICD-10-CM | POA: Diagnosis not present

## 2015-11-03 DIAGNOSIS — I10 Essential (primary) hypertension: Secondary | ICD-10-CM | POA: Diagnosis not present

## 2015-12-03 DIAGNOSIS — I425 Other restrictive cardiomyopathy: Secondary | ICD-10-CM | POA: Diagnosis not present

## 2015-12-03 DIAGNOSIS — I340 Nonrheumatic mitral (valve) insufficiency: Secondary | ICD-10-CM | POA: Diagnosis not present

## 2015-12-03 DIAGNOSIS — I361 Nonrheumatic tricuspid (valve) insufficiency: Secondary | ICD-10-CM | POA: Diagnosis not present

## 2016-01-08 DIAGNOSIS — M1711 Unilateral primary osteoarthritis, right knee: Secondary | ICD-10-CM | POA: Diagnosis not present

## 2016-01-08 DIAGNOSIS — I10 Essential (primary) hypertension: Secondary | ICD-10-CM | POA: Diagnosis not present

## 2016-01-08 DIAGNOSIS — D649 Anemia, unspecified: Secondary | ICD-10-CM | POA: Diagnosis not present

## 2016-01-08 DIAGNOSIS — E785 Hyperlipidemia, unspecified: Secondary | ICD-10-CM | POA: Diagnosis not present

## 2016-01-08 DIAGNOSIS — I110 Hypertensive heart disease with heart failure: Secondary | ICD-10-CM | POA: Diagnosis not present

## 2016-01-08 DIAGNOSIS — E7800 Pure hypercholesterolemia, unspecified: Secondary | ICD-10-CM | POA: Diagnosis not present

## 2016-01-29 DIAGNOSIS — E785 Hyperlipidemia, unspecified: Secondary | ICD-10-CM | POA: Diagnosis not present

## 2016-01-29 DIAGNOSIS — I110 Hypertensive heart disease with heart failure: Secondary | ICD-10-CM | POA: Diagnosis not present

## 2016-01-29 DIAGNOSIS — M1711 Unilateral primary osteoarthritis, right knee: Secondary | ICD-10-CM | POA: Diagnosis not present

## 2016-01-29 DIAGNOSIS — R000 Tachycardia, unspecified: Secondary | ICD-10-CM | POA: Diagnosis not present

## 2016-03-04 DIAGNOSIS — E6609 Other obesity due to excess calories: Secondary | ICD-10-CM | POA: Diagnosis not present

## 2016-03-04 DIAGNOSIS — R0602 Shortness of breath: Secondary | ICD-10-CM | POA: Diagnosis not present

## 2016-03-04 DIAGNOSIS — I10 Essential (primary) hypertension: Secondary | ICD-10-CM | POA: Diagnosis not present

## 2016-03-04 DIAGNOSIS — E784 Other hyperlipidemia: Secondary | ICD-10-CM | POA: Diagnosis not present

## 2016-03-04 DIAGNOSIS — J00 Acute nasopharyngitis [common cold]: Secondary | ICD-10-CM | POA: Diagnosis not present

## 2016-03-04 DIAGNOSIS — E559 Vitamin D deficiency, unspecified: Secondary | ICD-10-CM | POA: Diagnosis not present

## 2016-03-29 DIAGNOSIS — D649 Anemia, unspecified: Secondary | ICD-10-CM | POA: Diagnosis not present

## 2016-03-29 DIAGNOSIS — M1711 Unilateral primary osteoarthritis, right knee: Secondary | ICD-10-CM | POA: Diagnosis not present

## 2016-03-29 DIAGNOSIS — Z23 Encounter for immunization: Secondary | ICD-10-CM | POA: Diagnosis not present

## 2016-03-29 DIAGNOSIS — E785 Hyperlipidemia, unspecified: Secondary | ICD-10-CM | POA: Diagnosis not present

## 2016-03-29 DIAGNOSIS — R000 Tachycardia, unspecified: Secondary | ICD-10-CM | POA: Diagnosis not present

## 2016-03-29 DIAGNOSIS — I10 Essential (primary) hypertension: Secondary | ICD-10-CM | POA: Diagnosis not present

## 2016-03-29 DIAGNOSIS — E7800 Pure hypercholesterolemia, unspecified: Secondary | ICD-10-CM | POA: Diagnosis not present

## 2016-03-29 DIAGNOSIS — M170 Bilateral primary osteoarthritis of knee: Secondary | ICD-10-CM | POA: Diagnosis not present

## 2016-04-05 DIAGNOSIS — E784 Other hyperlipidemia: Secondary | ICD-10-CM | POA: Diagnosis not present

## 2016-04-05 DIAGNOSIS — E559 Vitamin D deficiency, unspecified: Secondary | ICD-10-CM | POA: Diagnosis not present

## 2016-04-05 DIAGNOSIS — I10 Essential (primary) hypertension: Secondary | ICD-10-CM | POA: Diagnosis not present

## 2016-04-05 DIAGNOSIS — M25561 Pain in right knee: Secondary | ICD-10-CM | POA: Diagnosis not present

## 2016-04-05 DIAGNOSIS — R0602 Shortness of breath: Secondary | ICD-10-CM | POA: Diagnosis not present

## 2016-04-05 DIAGNOSIS — E6609 Other obesity due to excess calories: Secondary | ICD-10-CM | POA: Diagnosis not present

## 2016-06-03 DIAGNOSIS — E784 Other hyperlipidemia: Secondary | ICD-10-CM | POA: Diagnosis not present

## 2016-06-03 DIAGNOSIS — E6609 Other obesity due to excess calories: Secondary | ICD-10-CM | POA: Diagnosis not present

## 2016-06-03 DIAGNOSIS — E559 Vitamin D deficiency, unspecified: Secondary | ICD-10-CM | POA: Diagnosis not present

## 2016-06-03 DIAGNOSIS — I10 Essential (primary) hypertension: Secondary | ICD-10-CM | POA: Diagnosis not present

## 2016-08-03 DIAGNOSIS — E785 Hyperlipidemia, unspecified: Secondary | ICD-10-CM | POA: Diagnosis not present

## 2016-08-03 DIAGNOSIS — I10 Essential (primary) hypertension: Secondary | ICD-10-CM | POA: Diagnosis not present

## 2016-08-03 DIAGNOSIS — D649 Anemia, unspecified: Secondary | ICD-10-CM | POA: Diagnosis not present

## 2016-08-03 DIAGNOSIS — M170 Bilateral primary osteoarthritis of knee: Secondary | ICD-10-CM | POA: Diagnosis not present

## 2016-09-01 DIAGNOSIS — E559 Vitamin D deficiency, unspecified: Secondary | ICD-10-CM | POA: Diagnosis not present

## 2016-09-01 DIAGNOSIS — R001 Bradycardia, unspecified: Secondary | ICD-10-CM | POA: Diagnosis not present

## 2016-09-01 DIAGNOSIS — E6609 Other obesity due to excess calories: Secondary | ICD-10-CM | POA: Diagnosis not present

## 2016-09-01 DIAGNOSIS — E784 Other hyperlipidemia: Secondary | ICD-10-CM | POA: Diagnosis not present

## 2016-09-01 DIAGNOSIS — I10 Essential (primary) hypertension: Secondary | ICD-10-CM | POA: Diagnosis not present

## 2016-09-01 DIAGNOSIS — I4589 Other specified conduction disorders: Secondary | ICD-10-CM | POA: Diagnosis not present

## 2016-09-15 DIAGNOSIS — F4321 Adjustment disorder with depressed mood: Secondary | ICD-10-CM | POA: Diagnosis not present

## 2016-09-15 DIAGNOSIS — R7309 Other abnormal glucose: Secondary | ICD-10-CM | POA: Diagnosis not present

## 2016-09-15 DIAGNOSIS — I110 Hypertensive heart disease with heart failure: Secondary | ICD-10-CM | POA: Diagnosis not present

## 2016-09-15 DIAGNOSIS — E785 Hyperlipidemia, unspecified: Secondary | ICD-10-CM | POA: Diagnosis not present

## 2016-09-15 DIAGNOSIS — M170 Bilateral primary osteoarthritis of knee: Secondary | ICD-10-CM | POA: Diagnosis not present

## 2016-09-29 DIAGNOSIS — E6609 Other obesity due to excess calories: Secondary | ICD-10-CM | POA: Diagnosis not present

## 2016-09-29 DIAGNOSIS — D500 Iron deficiency anemia secondary to blood loss (chronic): Secondary | ICD-10-CM | POA: Diagnosis not present

## 2016-09-29 DIAGNOSIS — E559 Vitamin D deficiency, unspecified: Secondary | ICD-10-CM | POA: Diagnosis not present

## 2016-09-29 DIAGNOSIS — I10 Essential (primary) hypertension: Secondary | ICD-10-CM | POA: Diagnosis not present

## 2016-09-29 DIAGNOSIS — E784 Other hyperlipidemia: Secondary | ICD-10-CM | POA: Diagnosis not present

## 2016-11-19 DIAGNOSIS — Z1231 Encounter for screening mammogram for malignant neoplasm of breast: Secondary | ICD-10-CM | POA: Diagnosis not present

## 2016-11-19 DIAGNOSIS — Z780 Asymptomatic menopausal state: Secondary | ICD-10-CM | POA: Diagnosis not present

## 2016-11-19 DIAGNOSIS — M8589 Other specified disorders of bone density and structure, multiple sites: Secondary | ICD-10-CM | POA: Diagnosis not present

## 2016-11-29 DIAGNOSIS — M25561 Pain in right knee: Secondary | ICD-10-CM | POA: Diagnosis not present

## 2016-11-29 DIAGNOSIS — E559 Vitamin D deficiency, unspecified: Secondary | ICD-10-CM | POA: Diagnosis not present

## 2016-11-29 DIAGNOSIS — D500 Iron deficiency anemia secondary to blood loss (chronic): Secondary | ICD-10-CM | POA: Diagnosis not present

## 2016-11-29 DIAGNOSIS — E6609 Other obesity due to excess calories: Secondary | ICD-10-CM | POA: Diagnosis not present

## 2016-11-29 DIAGNOSIS — I10 Essential (primary) hypertension: Secondary | ICD-10-CM | POA: Diagnosis not present

## 2016-11-29 DIAGNOSIS — E784 Other hyperlipidemia: Secondary | ICD-10-CM | POA: Diagnosis not present

## 2016-12-15 DIAGNOSIS — M1711 Unilateral primary osteoarthritis, right knee: Secondary | ICD-10-CM | POA: Diagnosis not present

## 2016-12-15 DIAGNOSIS — I110 Hypertensive heart disease with heart failure: Secondary | ICD-10-CM | POA: Diagnosis not present

## 2016-12-15 DIAGNOSIS — I10 Essential (primary) hypertension: Secondary | ICD-10-CM | POA: Diagnosis not present

## 2016-12-15 DIAGNOSIS — E785 Hyperlipidemia, unspecified: Secondary | ICD-10-CM | POA: Diagnosis not present

## 2017-02-01 DIAGNOSIS — I10 Essential (primary) hypertension: Secondary | ICD-10-CM | POA: Diagnosis not present

## 2017-02-01 DIAGNOSIS — E6609 Other obesity due to excess calories: Secondary | ICD-10-CM | POA: Diagnosis not present

## 2017-02-01 DIAGNOSIS — E559 Vitamin D deficiency, unspecified: Secondary | ICD-10-CM | POA: Diagnosis not present

## 2017-02-01 DIAGNOSIS — M25561 Pain in right knee: Secondary | ICD-10-CM | POA: Diagnosis not present

## 2017-02-01 DIAGNOSIS — E784 Other hyperlipidemia: Secondary | ICD-10-CM | POA: Diagnosis not present

## 2017-03-15 DIAGNOSIS — M130 Polyarthritis, unspecified: Secondary | ICD-10-CM | POA: Diagnosis not present

## 2017-03-15 DIAGNOSIS — I10 Essential (primary) hypertension: Secondary | ICD-10-CM | POA: Diagnosis not present

## 2017-03-15 DIAGNOSIS — Z23 Encounter for immunization: Secondary | ICD-10-CM | POA: Diagnosis not present

## 2017-04-25 DIAGNOSIS — R072 Precordial pain: Secondary | ICD-10-CM | POA: Diagnosis not present

## 2017-04-25 DIAGNOSIS — I425 Other restrictive cardiomyopathy: Secondary | ICD-10-CM | POA: Diagnosis not present

## 2017-04-25 DIAGNOSIS — R0602 Shortness of breath: Secondary | ICD-10-CM | POA: Diagnosis not present

## 2017-06-14 DIAGNOSIS — I10 Essential (primary) hypertension: Secondary | ICD-10-CM | POA: Diagnosis not present

## 2017-06-14 DIAGNOSIS — E7800 Pure hypercholesterolemia, unspecified: Secondary | ICD-10-CM | POA: Diagnosis not present

## 2017-06-14 DIAGNOSIS — E118 Type 2 diabetes mellitus with unspecified complications: Secondary | ICD-10-CM | POA: Diagnosis not present

## 2017-06-14 DIAGNOSIS — M130 Polyarthritis, unspecified: Secondary | ICD-10-CM | POA: Diagnosis not present

## 2017-06-14 DIAGNOSIS — E785 Hyperlipidemia, unspecified: Secondary | ICD-10-CM | POA: Diagnosis not present

## 2017-07-26 DIAGNOSIS — I10 Essential (primary) hypertension: Secondary | ICD-10-CM | POA: Diagnosis not present

## 2017-07-26 DIAGNOSIS — E7849 Other hyperlipidemia: Secondary | ICD-10-CM | POA: Diagnosis not present

## 2017-07-26 DIAGNOSIS — E559 Vitamin D deficiency, unspecified: Secondary | ICD-10-CM | POA: Diagnosis not present

## 2017-10-12 DIAGNOSIS — E785 Hyperlipidemia, unspecified: Secondary | ICD-10-CM | POA: Diagnosis not present

## 2017-10-12 DIAGNOSIS — M130 Polyarthritis, unspecified: Secondary | ICD-10-CM | POA: Diagnosis not present

## 2017-10-12 DIAGNOSIS — I10 Essential (primary) hypertension: Secondary | ICD-10-CM | POA: Diagnosis not present

## 2017-10-12 DIAGNOSIS — E118 Type 2 diabetes mellitus with unspecified complications: Secondary | ICD-10-CM | POA: Diagnosis not present

## 2017-10-12 DIAGNOSIS — D649 Anemia, unspecified: Secondary | ICD-10-CM | POA: Diagnosis not present

## 2017-10-25 DIAGNOSIS — R42 Dizziness and giddiness: Secondary | ICD-10-CM | POA: Diagnosis not present

## 2017-10-25 DIAGNOSIS — I10 Essential (primary) hypertension: Secondary | ICD-10-CM | POA: Diagnosis not present

## 2017-10-25 DIAGNOSIS — E7849 Other hyperlipidemia: Secondary | ICD-10-CM | POA: Diagnosis not present

## 2017-11-24 ENCOUNTER — Encounter (HOSPITAL_COMMUNITY): Payer: Self-pay | Admitting: Emergency Medicine

## 2017-11-24 ENCOUNTER — Inpatient Hospital Stay (HOSPITAL_COMMUNITY)
Admission: EM | Admit: 2017-11-24 | Discharge: 2017-11-26 | DRG: 065 | Disposition: A | Discharge status: Home / Self Care | Payer: Medicare HMO | Attending: Family Medicine | Admitting: Family Medicine

## 2017-11-24 ENCOUNTER — Emergency Department (HOSPITAL_COMMUNITY): Payer: Medicare HMO

## 2017-11-24 DIAGNOSIS — I5030 Unspecified diastolic (congestive) heart failure: Secondary | ICD-10-CM | POA: Diagnosis not present

## 2017-11-24 DIAGNOSIS — Z96651 Presence of right artificial knee joint: Secondary | ICD-10-CM | POA: Diagnosis present

## 2017-11-24 DIAGNOSIS — Z823 Family history of stroke: Secondary | ICD-10-CM

## 2017-11-24 DIAGNOSIS — I6381 Other cerebral infarction due to occlusion or stenosis of small artery: Principal | ICD-10-CM | POA: Diagnosis present

## 2017-11-24 DIAGNOSIS — I6523 Occlusion and stenosis of bilateral carotid arteries: Secondary | ICD-10-CM | POA: Diagnosis not present

## 2017-11-24 DIAGNOSIS — R29701 NIHSS score 1: Secondary | ICD-10-CM | POA: Diagnosis present

## 2017-11-24 DIAGNOSIS — E049 Nontoxic goiter, unspecified: Secondary | ICD-10-CM | POA: Diagnosis not present

## 2017-11-24 DIAGNOSIS — I6389 Other cerebral infarction: Secondary | ICD-10-CM

## 2017-11-24 DIAGNOSIS — G8191 Hemiplegia, unspecified affecting right dominant side: Secondary | ICD-10-CM | POA: Diagnosis present

## 2017-11-24 DIAGNOSIS — I739 Peripheral vascular disease, unspecified: Secondary | ICD-10-CM | POA: Diagnosis not present

## 2017-11-24 DIAGNOSIS — E785 Hyperlipidemia, unspecified: Secondary | ICD-10-CM

## 2017-11-24 DIAGNOSIS — I10 Essential (primary) hypertension: Secondary | ICD-10-CM | POA: Diagnosis present

## 2017-11-24 DIAGNOSIS — D539 Nutritional anemia, unspecified: Secondary | ICD-10-CM

## 2017-11-24 DIAGNOSIS — N390 Urinary tract infection, site not specified: Secondary | ICD-10-CM | POA: Diagnosis not present

## 2017-11-24 DIAGNOSIS — F419 Anxiety disorder, unspecified: Secondary | ICD-10-CM | POA: Diagnosis not present

## 2017-11-24 DIAGNOSIS — Z8249 Family history of ischemic heart disease and other diseases of the circulatory system: Secondary | ICD-10-CM | POA: Diagnosis not present

## 2017-11-24 DIAGNOSIS — I639 Cerebral infarction, unspecified: Secondary | ICD-10-CM | POA: Diagnosis not present

## 2017-11-24 DIAGNOSIS — R42 Dizziness and giddiness: Secondary | ICD-10-CM | POA: Diagnosis not present

## 2017-11-24 DIAGNOSIS — Z96659 Presence of unspecified artificial knee joint: Secondary | ICD-10-CM | POA: Diagnosis present

## 2017-11-24 DIAGNOSIS — R269 Unspecified abnormalities of gait and mobility: Secondary | ICD-10-CM | POA: Diagnosis not present

## 2017-11-24 DIAGNOSIS — I6330 Cerebral infarction due to thrombosis of unspecified cerebral artery: Secondary | ICD-10-CM

## 2017-11-24 LAB — BASIC METABOLIC PANEL
Anion gap: 8 (ref 5–15)
BUN: 20 mg/dL (ref 8–23)
CHLORIDE: 110 mmol/L (ref 98–111)
CO2: 26 mmol/L (ref 22–32)
Calcium: 10.2 mg/dL (ref 8.9–10.3)
Creatinine, Ser: 1.09 mg/dL — ABNORMAL HIGH (ref 0.44–1.00)
GFR calc Af Amer: 59 mL/min — ABNORMAL LOW (ref >60)
GFR calc non Af Amer: 51 mL/min — ABNORMAL LOW (ref >60)
Glucose, Bld: 104 mg/dL — ABNORMAL HIGH (ref 70–99)
POTASSIUM: 4.3 mmol/L (ref 3.5–5.1)
SODIUM: 144 mmol/L (ref 135–145)

## 2017-11-24 LAB — URINALYSIS, ROUTINE W REFLEX MICROSCOPIC
Bilirubin Urine: NEGATIVE
Glucose, UA: NEGATIVE mg/dL
KETONES UR: NEGATIVE mg/dL
LEUKOCYTES UA: NEGATIVE
Nitrite: POSITIVE — AB
Protein, ur: NEGATIVE mg/dL
Specific Gravity, Urine: 1.013 (ref 1.005–1.030)
pH: 5 (ref 5.0–8.0)

## 2017-11-24 LAB — CBC
HEMATOCRIT: 35 % — AB (ref 36.0–46.0)
Hemoglobin: 11.3 g/dL — ABNORMAL LOW (ref 12.0–15.0)
MCH: 27.8 pg (ref 26.0–34.0)
MCHC: 32.3 g/dL (ref 30.0–36.0)
MCV: 86 fL (ref 78.0–100.0)
Platelets: 275 10*3/uL (ref 150–400)
RBC: 4.07 MIL/uL (ref 3.87–5.11)
RDW: 14.3 % (ref 11.5–15.5)
WBC: 5.9 10*3/uL (ref 4.0–10.5)

## 2017-11-24 MED ORDER — ONDANSETRON HCL 4 MG/2ML IJ SOLN: 4.0000 mg | Freq: Three times a day (TID) | INTRAMUSCULAR | Status: DC | PRN

## 2017-11-24 MED ORDER — STROKE: EARLY STAGES OF RECOVERY BOOK
Freq: Once | Status: AC
  Administered 2017-11-25: 02:00:00
  Filled 2017-11-24: qty 1

## 2017-11-24 MED ORDER — ALPRAZOLAM 0.25 MG PO TABS: 0.2500 mg | ORAL_TABLET | Freq: Every day | ORAL | Status: DC | PRN

## 2017-11-24 MED ORDER — SODIUM CHLORIDE 0.9 % IV SOLN
1.0000 g | INTRAVENOUS | Status: DC
  Administered 2017-11-25: 1 g via INTRAVENOUS
  Filled 2017-11-24 (×2): qty 10

## 2017-11-24 MED ORDER — HYDRALAZINE HCL 20 MG/ML IJ SOLN: 5.0000 mg | INTRAMUSCULAR | Status: DC | PRN

## 2017-11-24 MED ORDER — ACETAMINOPHEN 325 MG PO TABS
650.0000 mg | ORAL_TABLET | Freq: Four times a day (QID) | ORAL | Status: DC | PRN
  Administered 2017-11-25 – 2017-11-26 (×3): 650 mg via ORAL
  Filled 2017-11-24 (×3): qty 2

## 2017-11-24 MED ORDER — ENOXAPARIN SODIUM 40 MG/0.4ML ~~LOC~~ SOLN
40.0000 mg | SUBCUTANEOUS | Status: DC
  Administered 2017-11-25 (×2): 40 mg via SUBCUTANEOUS
  Filled 2017-11-24 (×2): qty 0.4

## 2017-11-24 MED ORDER — MECLIZINE HCL 12.5 MG PO TABS: 12.5000 mg | ORAL_TABLET | Freq: Three times a day (TID) | ORAL | Status: DC | PRN

## 2017-11-24 MED ORDER — ISOSORBIDE MONONITRATE ER 30 MG PO TB24
30.0000 mg | ORAL_TABLET | Freq: Every day | ORAL | Status: DC
  Administered 2017-11-25 – 2017-11-26 (×2): 30 mg via ORAL
  Filled 2017-11-24 (×2): qty 1

## 2017-11-24 MED ORDER — HYDROCODONE-ACETAMINOPHEN 5-325 MG PO TABS
1.0000 | ORAL_TABLET | Freq: Every evening | ORAL | Status: DC | PRN
  Administered 2017-11-25 – 2017-11-26 (×3): 1 via ORAL
  Filled 2017-11-24 (×4): qty 1

## 2017-11-24 MED ORDER — ATORVASTATIN CALCIUM 80 MG PO TABS
80.0000 mg | ORAL_TABLET | Freq: Every day | ORAL | Status: DC
  Administered 2017-11-25 – 2017-11-26 (×2): 80 mg via ORAL
  Filled 2017-11-24 (×3): qty 1

## 2017-11-24 MED ORDER — MECLIZINE HCL 25 MG PO TABS
25.0000 mg | ORAL_TABLET | Freq: Once | ORAL | Status: AC
  Administered 2017-11-24: 25 mg via ORAL
  Filled 2017-11-24: qty 1

## 2017-11-24 MED ORDER — SODIUM CHLORIDE 0.9 % IV BOLUS
1000.0000 mL | Freq: Once | INTRAVENOUS | Status: AC
  Administered 2017-11-24: 1000 mL via INTRAVENOUS

## 2017-11-24 MED ORDER — SENNOSIDES-DOCUSATE SODIUM 8.6-50 MG PO TABS: 1.0000 | ORAL_TABLET | Freq: Every evening | ORAL | Status: DC | PRN

## 2017-11-24 MED ORDER — IOPAMIDOL (ISOVUE-370) INJECTION 76%
100.0000 mL | Freq: Once | INTRAVENOUS | Status: AC | PRN
  Administered 2017-11-24: 100 mL via INTRAVENOUS

## 2017-11-24 MED ORDER — ASPIRIN 300 MG RE SUPP: 300.0000 mg | Freq: Every day | RECTAL | Status: DC

## 2017-11-24 MED ORDER — SODIUM CHLORIDE 0.9 % IV SOLN
INTRAVENOUS | Status: DC
  Administered 2017-11-25 (×2): via INTRAVENOUS

## 2017-11-24 MED ORDER — ASPIRIN 325 MG PO TABS
325.0000 mg | ORAL_TABLET | Freq: Every day | ORAL | Status: DC
  Administered 2017-11-25: 325 mg via ORAL
  Filled 2017-11-24: qty 1

## 2017-11-24 MED ORDER — IOPAMIDOL (ISOVUE-370) INJECTION 76%
INTRAVENOUS | Status: AC
  Filled 2017-11-24: qty 100

## 2017-11-24 MED ORDER — SODIUM CHLORIDE 0.9 % IV SOLN
1.0000 g | Freq: Once | INTRAVENOUS | Status: AC
  Administered 2017-11-24: 1 g via INTRAVENOUS
  Filled 2017-11-24: qty 10

## 2017-11-24 MED ORDER — ZOLPIDEM TARTRATE 5 MG PO TABS: 5.0000 mg | ORAL_TABLET | Freq: Every evening | ORAL | Status: DC | PRN

## 2017-11-24 NOTE — ED Notes (Signed)
Patient transported to MRI 

## 2017-11-24 NOTE — ED Provider Notes (Signed)
[facility] DEPT Provider Note   CSN: [id number] Arrival date & time: 11/24/17  1756     History   Chief Complaint Chief Complaint  Patient presents with  . Extremity Weakness  . Dizziness    HPI Susan Nichols is a 67 y.o. female history of hyperlipidemia, hypertension here presenting with dizziness, weakness.  Patient states that around 11 AM, she bent over and got up and had sudden onset of vertiginous symptoms.  She felt like the room was spinning and then she felt unsteady.  She also noticed some right arm and leg weakness as well.  Moreover she has some blurry vision.  Patient states that she still feels the room was spinning but her weakness has improved.  She had no history of strokes in the past.  Denies any fever or chills or vomiting or headaches.   The history is provided by the patient.    Past Medical History:  Diagnosis Date  . Hyperlipemia   . Hypertension     Patient Active Problem List   Diagnosis Date Noted  . Deficiency anemia 04/24/2014    Past Surgical History:  Procedure Laterality Date  . TOTAL KNEE ARTHROPLASTY  2007   right knee     OB History   None      Home Medications    Prior to Admission medications   Medication Sig Start Date End Date Taking? Authorizing Provider  ALPRAZolam Duanne Moron) 0.25 MG tablet Take 1 tablet by mouth daily as needed for anxiety.  04/05/14  Yes [provider]  amLODipine (NORVASC) 10 MG tablet Take 10 mg by mouth daily. 04/18/14  Yes [provider]  atenolol (TENORMIN) 50 MG tablet Take 50 mg by mouth daily. 04/05/14  Yes [provider]  atorvastatin (LIPITOR) 40 MG tablet Take 40 mg by mouth daily.   Yes [provider]  chlorthalidone (HYGROTON) 25 MG tablet Take 25 mg by mouth daily. 05/06/14  Yes [provider]  cloNIDine (CATAPRES) 0.2 MG tablet Take 0.2 mg by mouth daily. 04/05/14  Yes [provider]  hydrALAZINE  (APRESOLINE) 25 MG tablet Take 25 tablets by mouth 2 (two) times daily. 09/28/17  Yes [provider]  HYDROcodone-acetaminophen (NORCO/VICODIN) 5-325 MG per tablet Take 1 tablet by mouth at bedtime as needed for moderate pain.  04/18/14  Yes [provider]  isosorbide mononitrate (IMDUR) 60 MG 24 hr tablet Take 30 mg by mouth daily.  10/13/17  Yes [provider]  lisinopril (PRINIVIL,ZESTRIL) 20 MG tablet Take 20 mg by mouth daily. 04/05/14  Yes [provider]  potassium chloride (MICRO-K) 10 MEQ CR capsule Take 10 mEq by mouth daily. 04/08/14  Yes [provider]    Family History No family history on file.  Social History Social History   Tobacco Use  . Smoking status: Never Smoker  . Smokeless tobacco: Never Used  Substance Use Topics  . Alcohol use: No  . Drug use: No     Allergies   Patient has no known allergies.   Review of Systems Review of Systems  Musculoskeletal: Positive for extremity weakness.  Neurological: Positive for dizziness and weakness.  All other systems reviewed and are negative.    Physical Exam Updated Vital Signs BP (!) 152/68 (BP Location: Right Arm)   Pulse 81   Temp 99.1 F (37.3 C) (Oral)   Resp 18   Ht 5\' 4"  (1.626 m)   Wt 101.2 kg (223 lb)  SpO2 99%   BMI 38.28 kg/m   Physical Exam  Constitutional: She is oriented to person, place, and time. She appears well-developed.  HENT:  Head: Normocephalic.  Eyes: Pupils are equal, round, and reactive to light. Conjunctivae and EOM are normal.  Neck: Normal range of motion. Neck supple.  Cardiovascular: Normal rate, regular rhythm and normal heart sounds.  Pulmonary/Chest: Effort normal and breath sounds normal. No stridor. No respiratory distress. She has no wheezes.  Abdominal: Soft. Bowel sounds are normal. She exhibits no distension. There is no tenderness.  Musculoskeletal: Normal range of motion.  Neurological: She is alert and oriented  to person, place, and time.  CN 2- 12 intact. Slightly dec sensation R arm and leg and strength 4/5 R arm and leg. ? Dysmetria R arm.   Skin: Skin is warm.  Psychiatric: She has a normal mood and affect.  Nursing note and vitals reviewed.    ED Treatments / Results  Labs (all labs ordered are listed, but only abnormal results are displayed) Labs Reviewed  BASIC METABOLIC PANEL - Abnormal; Notable for the following components:      Result Value   Glucose, Bld 104 (*)    Creatinine, Ser 1.09 (*)    GFR calc non Af Amer 51 (*)    GFR calc Af Amer 59 (*)    All other components within normal limits  CBC - Abnormal; Notable for the following components:   Hemoglobin 11.3 (*)    HCT 35.0 (*)    All other components within normal limits  URINALYSIS, ROUTINE W REFLEX MICROSCOPIC - Abnormal; Notable for the following components:   Hgb urine dipstick MODERATE (*)    Nitrite POSITIVE (*)    Bacteria, UA MANY (*)    All other components within normal limits    EKG None  Radiology Mr Brain Wo Contrast  Result Date: 11/24/2017 CLINICAL DATA:  Leg weakness, presyncope. Vertigo with bending over. RIGHT eye blurry vision. History of hypertension, hyperlipidemia. EXAM: MRI HEAD WITHOUT CONTRAST TECHNIQUE: Multiplanar, multiecho pulse sequences of the brain and surrounding structures were obtained without intravenous contrast. COMPARISON:  None. FINDINGS: INTRACRANIAL CONTENTS: 2 RIGHT M1 LEFT cerebellar foci of reduced diffusion with low ADC values. Subcentimeter LEFT thalamus reduced diffusion with low ADC values. No susceptibility artifact to suggest hemorrhage. No parenchymal brain volume loss for age. No midline shift, mass effect or masses. No significant white matter changes for age. No abnormal extra-axial fluid collections. VASCULAR: Normal major intracranial vascular flow voids present at skull base. SKULL AND UPPER CERVICAL SPINE: Borderline empty sella. No suspicious calvarial bone  marrow signal. Craniocervical junction maintained. SINUSES/ORBITS: The mastoid air-cells and included paranasal sinuses are well-aerated.The included ocular globes and orbital contents are non-suspicious. OTHER: Patient is edentulous. IMPRESSION: 1. Acute bilateral cerebella and LEFT thalamus subcentimeter nonhemorrhagic infarcts. Findings may be related to vertebrobasilar insufficiency or, small vessel ischemic disease. Consider CTA HEAD and neck for further evaluation. 2. Otherwise negative noncontrast MRI head for age. 3. Acute findings discussed with and reconfirmed by Dr.DAVID YAO on 11/24/2017 at 10:20 pm. Electronically Signed   By: Elon Alas M.D.   On: 11/24/2017 22:26    Procedures Procedures (including critical care time)  CRITICAL CARE Performed by: Wandra Arthurs   Total critical care time: 30 minutes  Critical care time was exclusive of separately billable procedures and treating other patients.  Critical care was necessary to treat or prevent imminent or life-threatening deterioration.  Critical care was time spent  personally by me on the following activities: development of treatment plan with patient and/or surrogate as well as nursing, discussions with consultants, evaluation of patient's response to treatment, examination of patient, obtaining history from patient or surrogate, ordering and performing treatments and interventions, ordering and review of laboratory studies, ordering and review of radiographic studies, pulse oximetry and re-evaluation of patient's condition.   Medications Ordered in ED Medications  sodium chloride 0.9 % bolus 1,000 mL (1,000 mLs Intravenous New Bag/Given 11/24/17 2219)  cefTRIAXone (ROCEPHIN) 1 g in sodium chloride 0.9 % 100 mL IVPB (1 g Intravenous New Bag/Given 11/24/17 2222)  iopamidol (ISOVUE-370) 76 % injection (has no administration in time range)  iopamidol (ISOVUE-370) 76 % injection 100 mL (has no administration in time range)    meclizine (ANTIVERT) tablet 25 mg (25 mg Oral Given 11/24/17 2126)     Initial Impression / Assessment and Plan / ED Course  I have reviewed the triage vital signs and the nursing notes.  Pertinent labs & imaging results that were available during my care of the patient were reviewed by me and considered in my medical decision making (see chart for details).     Susan Nichols is a 67 y.o. female here with dizziness, R arm and leg numbness, weakness. Concerned for posterior circulation stroke. Outside of TPA window and has minimal deficits. Will get labs, MRI brain.   10:47 PM MRI brain showed multiple strokes- bilateral cerebellar and L thalamus. UA + UTI, given rocephin. I called Dr. Cheral Marker from neurology. He agrees with CTA head/neck. Hospitalist to admit and transfer to [facility] for acute strokes.   Final Clinical Impressions(s) / ED Diagnoses   Final diagnoses:  Lower urinary tract infectious disease  Cerebrovascular accident (CVA) due to other mechanism [facility])    ED Discharge Orders    None       Drenda Freeze, MD 11/24/17 2247

## 2017-11-24 NOTE — ED Triage Notes (Signed)
Pt reports that since this morning around 11am when she bends over she gets really dizzy like room is spinning. Pt reports right leg weakness with weight bearing and makes her feel like she is going to fall forward. Reports right eye blurry as well. Pt has no neuro deficits.

## 2017-11-24 NOTE — H&P (Signed)
History and Physical    Susan Nichols OIN:[id number] DOB: 1950/11/21 DOA: 11/24/2017  Referring MD/NP/PA:   PCP: Lucianne Lei, MD   Patient coming from:  The patient is coming from home.  At baseline, pt is independent for most of ADL.    Chief Complaint: Dizziness, right-sided weakness, right eye blurry vision  HPI: Susan Nichols is a 67 y.o. female with medical history significant of hypertension, hyperlipidemia, anxiety, who presents with dizziness,  right-sided weakness, right eye blurry vision.  Patient states that she has been doing well ontil 11:00 AM when she started having dizziness, right-sided weakness and right eye blurry vision.  She feels room spinning around her. Her right arm and leg are week, which has gradually improved.  No slurred speech, facial droop, or hearing loss.  Patient has chills, but no fever.  Patient does not have chest pain, shortness breath, cough.  no nausea, vomiting, diarrhea, abdominal pain.  She has increased urinary frequency, but no dysuria or burning on urination.  ED Course: pt was found to have WBC 5.9, urinalysis with positive nitrite, WBC 6-10, many bacteria and clear appearance.  Creatinine 1.09, temperature 99.1, no tachycardia, no tachypnea.  Patient is admitted to telemetry bed as inpatient. MRI brain showed acute stroke. CTA of head and neck showed no LVO, and showed multinodular thyroid goiter. Dr. Cheral Marker of neurology was consulted.  MRI-brain showed 1. Acute bilateral cerebella and LEFT thalamus subcentimeter nonhemorrhagic infarcts. Findings may be related to vertebrobasilar insufficiency or, small vessel ischemic disease. Consider CTA HEAD and neck for further evaluation. 2. Otherwise negative noncontrast MRI head for age.  Review of Systems:   General: no fevers, has chills, no body weight gain, has fatigue HEENT: no blurry vision, hearing changes or sore throat Respiratory: no dyspnea, coughing, wheezing CV: no chest pain, no  palpitations GI: no nausea, vomiting, abdominal pain, diarrhea, constipation GU: no dysuria, burning on urination, has increased urinary frequency, no hematuria  Ext: no leg edema Neuro: has dizziness, right-sided weakness, right eye blurry vision Skin: no rash, no skin tear. MSK: No muscle spasm, no deformity, no limitation of range of movement in spin Heme: No easy bruising.  Travel history: No recent long distant travel.  Allergy: No Known Allergies  Past Medical History:  Diagnosis Date  . Hyperlipemia   . Hypertension     Past Surgical History:  Procedure Laterality Date  . TOTAL KNEE ARTHROPLASTY  2007   right knee    Social History:  reports that she has never smoked. She has never used smokeless tobacco. She reports that she does not drink alcohol or use drugs.  Family History: Mother has stroke, hypertension, hyperlipidemia; father has hypertension hyperlipidemia  Prior to Admission medications   Medication Sig Start Date End Date Taking? Authorizing Provider  ALPRAZolam Duanne Moron) 0.25 MG tablet Take 1 tablet by mouth daily as needed for anxiety.  04/05/14  Yes [provider]  amLODipine (NORVASC) 10 MG tablet Take 10 mg by mouth daily. 04/18/14  Yes [provider]  atenolol (TENORMIN) 50 MG tablet Take 50 mg by mouth daily. 04/05/14  Yes [provider]  atorvastatin (LIPITOR) 40 MG tablet Take 40 mg by mouth daily.   Yes [provider]  chlorthalidone (HYGROTON) 25 MG tablet Take 25 mg by mouth daily. 05/06/14  Yes [provider]  cloNIDine (CATAPRES) 0.2 MG tablet Take 0.2 mg by mouth daily. 04/05/14  Yes [provider]  hydrALAZINE (APRESOLINE) 25 MG tablet Take 25 tablets  by mouth 2 (two) times daily. 09/28/17  Yes [provider]  HYDROcodone-acetaminophen (NORCO/VICODIN) 5-325 MG per tablet Take 1 tablet by mouth at bedtime as needed for moderate pain.  04/18/14  Yes [provider]    isosorbide mononitrate (IMDUR) 60 MG 24 hr tablet Take 30 mg by mouth daily.  10/13/17  Yes [provider]  lisinopril (PRINIVIL,ZESTRIL) 20 MG tablet Take 20 mg by mouth daily. 04/05/14  Yes [provider]  potassium chloride (MICRO-K) 10 MEQ CR capsule Take 10 mEq by mouth daily. 04/08/14  Yes [provider]    Physical Exam: Vitals:   11/24/17 2031 11/24/17 2328 11/24/17 2344 11/25/17 0131  BP: (!) 152/68  (!) 169/93 (!) 158/65  Pulse: 81  (!) 105 89  Resp: 18  19 18   Temp: 99.1 F (37.3 C) 98.7 F (37.1 C)  98.5 F (36.9 C)  TempSrc: Oral   Oral  SpO2: 99%  100% 96%  Weight:    100.3 kg (221 lb 1.9 oz)  Height:    5\' 4"  (1.626 m)   General: Not in acute distress HEENT:       Eyes: PERRL, EOMI, no scleral icterus.       ENT: No discharge from the ears and nose, no pharynx injection, no tonsillar enlargement.        Neck: No JVD, no bruit, no mass felt. Heme: No neck lymph node enlargement. Cardiac: S1/S2, RRR, No murmurs, No gallops or rubs. Respiratory: No rales, wheezing, rhonchi or rubs. GI: Soft, nondistended, nontender, no rebound pain, no organomegaly, BS present. GU: No hematuria Ext: No pitting leg edema bilaterally. 2+DP/PT pulse bilaterally. Musculoskeletal: No joint deformities, No joint redness or warmth, no limitation of ROM in spin. Skin: No rashes.  Neuro: Alert, oriented X3, cranial nerves II-XII grossly intact, muscle strength 4/5 in right arm and 5/5 in other extremities.  Sensation to light touch intact. Psych: Patient is not psychotic, no suicidal or hemocidal ideation.  Labs on Admission: I have personally reviewed following labs and imaging studies  CBC: Recent Labs  Lab 11/24/17 1856  WBC 5.9  HGB 11.3*  HCT 35.0*  MCV 86.0  PLT 631   Basic Metabolic Panel: Recent Labs  Lab 11/24/17 1856  NA 144  K 4.3  CL 110  CO2 26  GLUCOSE 104*  BUN 20  CREATININE 1.09*  CALCIUM 10.2   GFR: Estimated Creatinine  Clearance: 57.6 mL/min (A) (by C-G formula based on SCr of 1.09 mg/dL (H)). Liver Function Tests: No results for input(s): AST, ALT, ALKPHOS, BILITOT, PROT, ALBUMIN in the last 168 hours. No results for input(s): LIPASE, AMYLASE in the last 168 hours. No results for input(s): AMMONIA in the last 168 hours. Coagulation Profile: No results for input(s): INR, PROTIME in the last 168 hours. Cardiac Enzymes: No results for input(s): CKTOTAL, CKMB, CKMBINDEX, TROPONINI in the last 168 hours. BNP (last 3 results) No results for input(s): PROBNP in the last 8760 hours. HbA1C: No results for input(s): HGBA1C in the last 72 hours. CBG: No results for input(s): GLUCAP in the last 168 hours. Lipid Profile: No results for input(s): CHOL, HDL, LDLCALC, TRIG, CHOLHDL, LDLDIRECT in the last 72 hours. Thyroid Function Tests: No results for input(s): TSH, T4TOTAL, FREET4, T3FREE, THYROIDAB in the last 72 hours. Anemia Panel: No results for input(s): VITAMINB12, FOLATE, FERRITIN, TIBC, IRON, RETICCTPCT in the last 72 hours. Urine analysis:    Component Value Date/Time   COLORURINE YELLOW 11/24/2017 1813  APPEARANCEUR CLEAR 11/24/2017 1813   LABSPEC 1.013 11/24/2017 1813   PHURINE 5.0 11/24/2017 1813   GLUCOSEU NEGATIVE 11/24/2017 1813   HGBUR MODERATE (A) 11/24/2017 1813   BILIRUBINUR NEGATIVE 11/24/2017 1813   KETONESUR NEGATIVE 11/24/2017 1813   PROTEINUR NEGATIVE 11/24/2017 1813   NITRITE POSITIVE (A) 11/24/2017 1813   LEUKOCYTESUR NEGATIVE 11/24/2017 1813   Sepsis Labs: @LABRCNTIP (procalcitonin:4,lacticidven:4) )No results found for this or any previous visit (from the past 240 hour(s)).   Radiological Exams on Admission: Ct Angio Head W Or Wo Contrast  Result Date: 11/24/2017 CLINICAL DATA:  67 y/o F; dizziness and weakness. Evaluation of stroke. EXAM: CT ANGIOGRAPHY HEAD AND NECK TECHNIQUE: Multidetector CT imaging of the head and neck was performed using the standard protocol during  bolus administration of intravenous contrast. Multiplanar CT image reconstructions and MIPs were obtained to evaluate the vascular anatomy. Carotid stenosis measurements (when applicable) are obtained utilizing NASCET criteria, using the distal internal carotid diameter as the denominator. CONTRAST:  186mL ISOVUE-370 IOPAMIDOL (ISOVUE-370) INJECTION 76% COMPARISON:  11/24/2017 MRI of the head. FINDINGS: CT HEAD FINDINGS Brain: Small lucency within left lateral thalamus corresponding to infarct on MRI of the head. No new intracranial hemorrhage, stroke, or focal mass effect of the brain. Very small infarcts within the right cerebellum are poorly visualized on CT. No extra-axial collection, hydrocephalus, or herniation. Vascular: As below. Skull: Normal. Negative for fracture or focal lesion. Sinuses: Imaged portions are clear. Orbits: No acute finding. Review of the MIP images confirms the above findings CTA NECK FINDINGS Aortic arch: Standard branching. Imaged portion shows no evidence of aneurysm or dissection. No significant stenosis of the major arch vessel origins. Mild calcific atherosclerosis of the aortic arch. Right carotid system: No evidence of dissection, stenosis (50% or greater) or occlusion. Left carotid system: No evidence of dissection, stenosis (50% or greater) or occlusion. Mild non stenotic calcific atherosclerosis of the carotid bifurcation. Vertebral arteries: Codominant. No evidence of dissection, stenosis (50% or greater) or occlusion. Skeleton: Moderate cervical spondylosis with multilevel disc and facet degenerative changes. Other neck: Multinodular thyroid goiter with the left lobe of the thyroid measuring up to 7.5 cm and the right lobe measuring up to 6.1 cm. There is mass effect on the airway which is deviated to the right. The left lobe of the thyroid extends into the upper mediastinum. Upper chest: Negative. Review of the MIP images confirms the above findings CTA HEAD FINDINGS  Anterior circulation: Calcific atherosclerosis of carotid siphons with mild bilateral paraclinoid stenosis. Patent bilateral ACA and MCA. No large vessel occlusion, aneurysm, or significant stenosis is identified. Posterior circulation: No significant stenosis, proximal occlusion, aneurysm, or vascular malformation. Venous sinuses: As permitted by contrast timing, patent. Anatomic variants: Large left A1, anterior communicating artery, and diminutive right A1, normal variant. No posterior communicating artery identified, likely hypoplastic or absent. Delayed phase: No abnormal intracranial enhancement. Review of the MIP images confirms the above findings IMPRESSION: 1. Small infarct in the left thalamus stable from prior MRI. Very small infarcts in the right cerebellum are poorly visualized on CT. No new acute intracranial abnormality. 2. Patent carotid and vertebral arteries. No dissection, aneurysm, or hemodynamically significant stenosis utilizing NASCET criteria. 3. Patent anterior and posterior intracranial circulation. No large vessel occlusion, aneurysm, or significant stenosis. 4. Mild calcific atherosclerosis of the aortic arch, carotid bifurcations, and carotid siphons. 5. Multinodular thyroid goiter. Thyroid ultrasound is recommended on a nonemergent basis. Electronically Signed   By: Kristine Garbe M.D.   On: 11/24/2017  23:30   Ct Angio Neck W And/or Wo Contrast  Result Date: 11/24/2017 CLINICAL DATA:  67 y/o F; dizziness and weakness. Evaluation of stroke. EXAM: CT ANGIOGRAPHY HEAD AND NECK TECHNIQUE: Multidetector CT imaging of the head and neck was performed using the standard protocol during bolus administration of intravenous contrast. Multiplanar CT image reconstructions and MIPs were obtained to evaluate the vascular anatomy. Carotid stenosis measurements (when applicable) are obtained utilizing NASCET criteria, using the distal internal carotid diameter as the denominator. CONTRAST:   168mL ISOVUE-370 IOPAMIDOL (ISOVUE-370) INJECTION 76% COMPARISON:  11/24/2017 MRI of the head. FINDINGS: CT HEAD FINDINGS Brain: Small lucency within left lateral thalamus corresponding to infarct on MRI of the head. No new intracranial hemorrhage, stroke, or focal mass effect of the brain. Very small infarcts within the right cerebellum are poorly visualized on CT. No extra-axial collection, hydrocephalus, or herniation. Vascular: As below. Skull: Normal. Negative for fracture or focal lesion. Sinuses: Imaged portions are clear. Orbits: No acute finding. Review of the MIP images confirms the above findings CTA NECK FINDINGS Aortic arch: Standard branching. Imaged portion shows no evidence of aneurysm or dissection. No significant stenosis of the major arch vessel origins. Mild calcific atherosclerosis of the aortic arch. Right carotid system: No evidence of dissection, stenosis (50% or greater) or occlusion. Left carotid system: No evidence of dissection, stenosis (50% or greater) or occlusion. Mild non stenotic calcific atherosclerosis of the carotid bifurcation. Vertebral arteries: Codominant. No evidence of dissection, stenosis (50% or greater) or occlusion. Skeleton: Moderate cervical spondylosis with multilevel disc and facet degenerative changes. Other neck: Multinodular thyroid goiter with the left lobe of the thyroid measuring up to 7.5 cm and the right lobe measuring up to 6.1 cm. There is mass effect on the airway which is deviated to the right. The left lobe of the thyroid extends into the upper mediastinum. Upper chest: Negative. Review of the MIP images confirms the above findings CTA HEAD FINDINGS Anterior circulation: Calcific atherosclerosis of carotid siphons with mild bilateral paraclinoid stenosis. Patent bilateral ACA and MCA. No large vessel occlusion, aneurysm, or significant stenosis is identified. Posterior circulation: No significant stenosis, proximal occlusion, aneurysm, or vascular  malformation. Venous sinuses: As permitted by contrast timing, patent. Anatomic variants: Large left A1, anterior communicating artery, and diminutive right A1, normal variant. No posterior communicating artery identified, likely hypoplastic or absent. Delayed phase: No abnormal intracranial enhancement. Review of the MIP images confirms the above findings IMPRESSION: 1. Small infarct in the left thalamus stable from prior MRI. Very small infarcts in the right cerebellum are poorly visualized on CT. No new acute intracranial abnormality. 2. Patent carotid and vertebral arteries. No dissection, aneurysm, or hemodynamically significant stenosis utilizing NASCET criteria. 3. Patent anterior and posterior intracranial circulation. No large vessel occlusion, aneurysm, or significant stenosis. 4. Mild calcific atherosclerosis of the aortic arch, carotid bifurcations, and carotid siphons. 5. Multinodular thyroid goiter. Thyroid ultrasound is recommended on a nonemergent basis. Electronically Signed   By: Kristine Garbe M.D.   On: 11/24/2017 23:30   Mr Brain Wo Contrast  Result Date: 11/24/2017 CLINICAL DATA:  Leg weakness, presyncope. Vertigo with bending over. RIGHT eye blurry vision. History of hypertension, hyperlipidemia. EXAM: MRI HEAD WITHOUT CONTRAST TECHNIQUE: Multiplanar, multiecho pulse sequences of the brain and surrounding structures were obtained without intravenous contrast. COMPARISON:  None. FINDINGS: INTRACRANIAL CONTENTS: 2 RIGHT M1 LEFT cerebellar foci of reduced diffusion with low ADC values. Subcentimeter LEFT thalamus reduced diffusion with low ADC values. No susceptibility artifact to  suggest hemorrhage. No parenchymal brain volume loss for age. No midline shift, mass effect or masses. No significant white matter changes for age. No abnormal extra-axial fluid collections. VASCULAR: Normal major intracranial vascular flow voids present at skull base. SKULL AND UPPER CERVICAL SPINE:  Borderline empty sella. No suspicious calvarial bone marrow signal. Craniocervical junction maintained. SINUSES/ORBITS: The mastoid air-cells and included paranasal sinuses are well-aerated.The included ocular globes and orbital contents are non-suspicious. OTHER: Patient is edentulous. IMPRESSION: 1. Acute bilateral cerebella and LEFT thalamus subcentimeter nonhemorrhagic infarcts. Findings may be related to vertebrobasilar insufficiency or, small vessel ischemic disease. Consider CTA HEAD and neck for further evaluation. 2. Otherwise negative noncontrast MRI head for age. 3. Acute findings discussed with and reconfirmed by Dr.DAVID YAO on 11/24/2017 at 10:20 pm. Electronically Signed   By: Elon Alas M.D.   On: 11/24/2017 22:26     EKG:Not done in ED, will get one.   Assessment/Plan Principal Problem:   Stroke [facility]) Active Problems:   Hyperlipemia   Hypertension   Anxiety   Thyroid goiter   UTI (urinary tract infection)   Stroke [facility]): MRI of brain showed acute bilateral cerebella and LEFT thalamus subcentimeter nonhemorrhagic infarcts. CTA of neck and head did not showed LVO. Dr. Cheral Marker of neurology was consulted.  - will admit to tele bed as inpt - will follow up Neurology's Recs.  - will Bp meds to allow permissive HTN in the setting of acute stroke  - start ASA  - fasting lipid panel and HbA1c  - 2D transthoracic echocardiography  - Check UDS  - PT/OT consult  Hyperlipemia: -inc Lipitorrease dose from 40 to 80 mg daily  Hypertension: -IV hydralazine as needed for SBP>220 -hold all home Bp meds  Anxiety: -Xanax  Thyroid goiter: incidental findings by CTA. -check TSH, Fee T4 and T4 -US-thyroid  UTI (urinary tract infection): -rocephin -f/u Urine culture and Bc    DVT ppx:  SQ Lovenox Code Status: Full code Family Communication:  Yes, patient's daughter and husband  at bed side Disposition Plan:  Anticipate discharge back to previous home  environment Consults called:  Dr. Cheral Marker Admission status: Inpatient/tele     Date of Service 11/25/2017    Ivor Costa [facility] Pager [phone]  If 7PM-7AM, please contact night-coverage [email or web address] Password [email or web address] 11/25/2017, 2:20 AM

## 2017-11-25 ENCOUNTER — Encounter (HOSPITAL_COMMUNITY): Payer: Self-pay | Admitting: Internal Medicine

## 2017-11-25 ENCOUNTER — Inpatient Hospital Stay (HOSPITAL_COMMUNITY): Payer: Medicare HMO

## 2017-11-25 ENCOUNTER — Other Ambulatory Visit: Payer: Self-pay

## 2017-11-25 DIAGNOSIS — I5030 Unspecified diastolic (congestive) heart failure: Secondary | ICD-10-CM

## 2017-11-25 DIAGNOSIS — Z8249 Family history of ischemic heart disease and other diseases of the circulatory system: Secondary | ICD-10-CM | POA: Diagnosis not present

## 2017-11-25 DIAGNOSIS — F419 Anxiety disorder, unspecified: Secondary | ICD-10-CM | POA: Diagnosis not present

## 2017-11-25 DIAGNOSIS — G8191 Hemiplegia, unspecified affecting right dominant side: Secondary | ICD-10-CM | POA: Diagnosis not present

## 2017-11-25 DIAGNOSIS — N390 Urinary tract infection, site not specified: Secondary | ICD-10-CM | POA: Diagnosis present

## 2017-11-25 DIAGNOSIS — I739 Peripheral vascular disease, unspecified: Secondary | ICD-10-CM | POA: Diagnosis not present

## 2017-11-25 DIAGNOSIS — I6381 Other cerebral infarction due to occlusion or stenosis of small artery: Secondary | ICD-10-CM | POA: Diagnosis not present

## 2017-11-25 DIAGNOSIS — I639 Cerebral infarction, unspecified: Secondary | ICD-10-CM

## 2017-11-25 DIAGNOSIS — I10 Essential (primary) hypertension: Secondary | ICD-10-CM

## 2017-11-25 DIAGNOSIS — Z823 Family history of stroke: Secondary | ICD-10-CM | POA: Diagnosis not present

## 2017-11-25 DIAGNOSIS — E049 Nontoxic goiter, unspecified: Secondary | ICD-10-CM | POA: Diagnosis present

## 2017-11-25 DIAGNOSIS — E785 Hyperlipidemia, unspecified: Secondary | ICD-10-CM | POA: Diagnosis not present

## 2017-11-25 LAB — ECHOCARDIOGRAM COMPLETE
HEIGHTINCHES: 64 in
WEIGHTICAEL: 3537.94 [oz_av]

## 2017-11-25 LAB — LIPID PANEL
CHOLESTEROL: 113 mg/dL (ref 0–200)
HDL: 35 mg/dL — ABNORMAL LOW (ref >40)
LDL Cholesterol: 58 mg/dL (ref 0–99)
Total CHOL/HDL Ratio: 3.2 RATIO
Triglycerides: 99 mg/dL (ref <150)
VLDL: 20 mg/dL (ref 0–40)

## 2017-11-25 LAB — HEMOGLOBIN A1C
HEMOGLOBIN A1C: 5.9 % — AB (ref 4.8–5.6)
MEAN PLASMA GLUCOSE: 122.63 mg/dL

## 2017-11-25 LAB — RAPID URINE DRUG SCREEN, HOSP PERFORMED
AMPHETAMINES: NOT DETECTED
Benzodiazepines: NOT DETECTED
Cocaine: NOT DETECTED
Opiates: POSITIVE — AB
Tetrahydrocannabinol: NOT DETECTED

## 2017-11-25 LAB — T4, FREE: FREE T4: 0.91 ng/dL (ref 0.82–1.77)

## 2017-11-25 LAB — TSH: TSH: 0.502 u[IU]/mL (ref 0.350–4.500)

## 2017-11-25 MED ORDER — ASPIRIN 81 MG PO CHEW
81.0000 mg | CHEWABLE_TABLET | Freq: Every day | ORAL | Status: DC
  Administered 2017-11-26: 81 mg via ORAL
  Filled 2017-11-25: qty 1

## 2017-11-25 MED ORDER — CLOPIDOGREL BISULFATE 75 MG PO TABS
75.0000 mg | ORAL_TABLET | Freq: Every day | ORAL | Status: DC
  Administered 2017-11-25 – 2017-11-26 (×2): 75 mg via ORAL
  Filled 2017-11-25 (×2): qty 1

## 2017-11-25 NOTE — Consult Note (Signed)
Referring Physician: Dr. Blaine Hamper    Chief Complaint: Dizziness and right lower extremity weakness  HPI: Susan Nichols is an 67 y.o. female who presented to the [facility] ED this evening with a c/c of room-spinning dizziness precipitated by bending over in conjunction with new RUE and RLE weakness and gait imbalance. She also reported blurring in her right eye. Symptoms began at 11 AM. No associated N/V.   MRI was obtained at the [facility] ED, revealing acute right cerebellar and left thalamus subcentimeter nonhemorrhagic infarcts. Findings were felt by Radiology to possibly be related to vertebrobasilar insufficiency or small vessel ischemic disease. Radiology report mentions "bilateral" and "acute" cerebellar infarcts, but on my review of the images they appear to be on the right side only and also are most likely subacute given corresponding hyperintensity on T2-weighted images.   Home medications include atorvastatin. There is no antiplatelet medication or anticoagulant on her home medications list. She has been started on ASA.   Past Medical History:  Diagnosis Date  . Hyperlipemia   . Hypertension     Past Surgical History:  Procedure Laterality Date  . TOTAL KNEE ARTHROPLASTY  2007   right knee    No family history on file. Social History:  reports that she has never smoked. She has never used smokeless tobacco. She reports that she does not drink alcohol or use drugs.  Allergies: No Known Allergies  Medications:  Prior to Admission:  Medications Prior to Admission  Medication Sig Dispense Refill Last Dose  . ALPRAZolam (XANAX) 0.25 MG tablet Take 1 tablet by mouth daily as needed for anxiety.   3 Past Week at Unknown time  . amLODipine (NORVASC) 10 MG tablet Take 10 mg by mouth daily.  3 11/23/2017 at Unknown time  . atenolol (TENORMIN) 50 MG tablet Take 50 mg by mouth daily.  4 11/23/2017 at 2130  . atorvastatin (LIPITOR) 40 MG tablet Take 40 mg by mouth daily.   11/23/2017 at Unknown time  .  chlorthalidone (HYGROTON) 25 MG tablet Take 25 mg by mouth daily.  1 11/23/2017 at Unknown time  . cloNIDine (CATAPRES) 0.2 MG tablet Take 0.2 mg by mouth daily.  6 11/23/2017 at Unknown time  . hydrALAZINE (APRESOLINE) 25 MG tablet Take 25 tablets by mouth 2 (two) times daily.  1 11/23/2017 at Unknown time  . HYDROcodone-acetaminophen (NORCO/VICODIN) 5-325 MG per tablet Take 1 tablet by mouth at bedtime as needed for moderate pain.   0 11/23/2017 at Unknown time  . isosorbide mononitrate (IMDUR) 60 MG 24 hr tablet Take 30 mg by mouth daily.   1 11/23/2017 at Unknown time  . lisinopril (PRINIVIL,ZESTRIL) 20 MG tablet Take 20 mg by mouth daily.  6 11/23/2017 at Unknown time  . potassium chloride (MICRO-K) 10 MEQ CR capsule Take 10 mEq by mouth daily.  2 11/23/2017 at Unknown time   Scheduled: . aspirin  300 mg Rectal Daily   Or  . aspirin  325 mg Oral Daily  . atorvastatin  80 mg Oral Daily  . enoxaparin (LOVENOX) injection  40 mg Subcutaneous Q24H  . iopamidol      . isosorbide mononitrate  30 mg Oral Daily   Continuous: . sodium chloride 75 mL/hr at 11/25/17 0027  . cefTRIAXone (ROCEPHIN)  IV      ROS: No headache. No fever. Other ROS as per HPI.   Physical Examination: Blood pressure (!) 158/65, pulse 89, temperature 98.5 F (36.9 C), temperature source Oral, resp. rate 18, height  5' 4"  (1.626 m), weight 100.3 kg (221 lb 1.9 oz), SpO2 96 %.  HEENT: [place]/AT Lungs: Respirations unlabored Ext: No edema. Scar from old right knee operation noted  Neurologic Examination: Mental Status: Alert, oriented, thought content appropriate.  Speech fluent without evidence of aphasia.  Able to follow all commands without difficulty. Cranial Nerves: II:  Visual fields intact with no extinction to DSS. PERRL. III,IV, VI: No ptosis. EOMI with saccadic visual pursuits noted. No nystagmus.  V,VII: Smile symmetric, facial temp sensation normal bilaterally VIII: hearing intact to voice IX,X: No  hypophonia XI: Symmetric XII: midline tongue extension  Motor: RUE with 4/5 deltoid and finger abductors, otherwise 5/5 LUE 5/5 RLE 4/5 proximal and distal except 5/5 ADF/APF LLE: 4/5 proximal and distal except 5/5 ADF/APF Right pronator drift noted.  Tone and bulk normal x 4 Sensory: Temp and light touch intact x 4 without extinction Deep Tendon Reflexes:  3+ bilateral brachioradialis and biceps 0 patellae and achilles bilaterally Plantars: Right: downgoing   Left: downgoing Cerebellar: Mild ataxia with FNF on right, normal on left Gait: Deferred  Results for orders placed or performed during the hospital encounter of 11/24/17 (from the past 48 hour(s))  Urinalysis, Routine w reflex microscopic     Status: Abnormal   Collection Time: 11/24/17  6:13 PM  Result Value Ref Range   Color, Urine YELLOW YELLOW   APPearance CLEAR CLEAR   Specific Gravity, Urine 1.013 1.005 - 1.030   pH 5.0 5.0 - 8.0   Glucose, UA NEGATIVE NEGATIVE mg/dL   Hgb urine dipstick MODERATE (A) NEGATIVE   Bilirubin Urine NEGATIVE NEGATIVE   Ketones, ur NEGATIVE NEGATIVE mg/dL   Protein, ur NEGATIVE NEGATIVE mg/dL   Nitrite POSITIVE (A) NEGATIVE   Leukocytes, UA NEGATIVE NEGATIVE   RBC / HPF 0-5 0 - 5 RBC/hpf   WBC, UA 6-10 0 - 5 WBC/hpf   Bacteria, UA MANY (A) NONE SEEN   Squamous Epithelial / LPF 6-10 0 - 5    Comment: Performed at [facility], [place] [address], [place], [place] [address]  Basic metabolic panel     Status: Abnormal   Collection Time: 11/24/17  6:56 PM  Result Value Ref Range   Sodium 144 135 - 145 mmol/L   Potassium 4.3 3.5 - 5.1 mmol/L   Chloride 110 98 - 111 mmol/L    Comment: Please note change in reference range.   CO2 26 22 - 32 mmol/L   Glucose, Bld 104 (H) 70 - 99 mg/dL    Comment: Please note change in reference range.   BUN 20 8 - 23 mg/dL    Comment: Please note change in reference range.   Creatinine, Ser 1.09 (H) 0.44 - 1.00 mg/dL   Calcium 10.2 8.9  - 10.3 mg/dL   GFR calc non Af Amer 51 (L) >60 mL/min   GFR calc Af Amer 59 (L) >60 mL/min    Comment: (NOTE) The eGFR has been calculated using the CKD EPI equation. This calculation has not been validated in all clinical situations. eGFR's persistently <60 mL/min signify possible Chronic Kidney Disease.    Anion gap 8 5 - 15    Comment: Performed at [facility], [place] [address], [place], [place] [address]  CBC     Status: Abnormal   Collection Time: 11/24/17  6:56 PM  Result Value Ref Range   WBC 5.9 4.0 - 10.5 K/uL   RBC 4.07 3.87 - 5.11 MIL/uL  Hemoglobin 11.3 (L) 12.0 - 15.0 g/dL   HCT 35.0 (L) 36.0 - 46.0 %   MCV 86.0 78.0 - 100.0 fL   MCH 27.8 26.0 - 34.0 pg   MCHC 32.3 30.0 - 36.0 g/dL   RDW 14.3 11.5 - 15.5 %   Platelets 275 150 - 400 K/uL    Comment: Performed at [facility], [place] [address], [place], [place] [address]   Ct Angio Head W Or Wo Contrast  Result Date: 11/24/2017 CLINICAL DATA:  67 y/o F; dizziness and weakness. Evaluation of stroke. EXAM: CT ANGIOGRAPHY HEAD AND NECK TECHNIQUE: Multidetector CT imaging of the head and neck was performed using the standard protocol during bolus administration of intravenous contrast. Multiplanar CT image reconstructions and MIPs were obtained to evaluate the vascular anatomy. Carotid stenosis measurements (when applicable) are obtained utilizing NASCET criteria, using the distal internal carotid diameter as the denominator. CONTRAST:  125m ISOVUE-370 IOPAMIDOL (ISOVUE-370) INJECTION 76% COMPARISON:  11/24/2017 MRI of the head. FINDINGS: CT HEAD FINDINGS Brain: Small lucency within left lateral thalamus corresponding to infarct on MRI of the head. No new intracranial hemorrhage, stroke, or focal mass effect of the brain. Very small infarcts within the right cerebellum are poorly visualized on CT. No extra-axial collection, hydrocephalus, or herniation. Vascular: As below. Skull: Normal. Negative  for fracture or focal lesion. Sinuses: Imaged portions are clear. Orbits: No acute finding. Review of the MIP images confirms the above findings CTA NECK FINDINGS Aortic arch: Standard branching. Imaged portion shows no evidence of aneurysm or dissection. No significant stenosis of the major arch vessel origins. Mild calcific atherosclerosis of the aortic arch. Right carotid system: No evidence of dissection, stenosis (50% or greater) or occlusion. Left carotid system: No evidence of dissection, stenosis (50% or greater) or occlusion. Mild non stenotic calcific atherosclerosis of the carotid bifurcation. Vertebral arteries: Codominant. No evidence of dissection, stenosis (50% or greater) or occlusion. Skeleton: Moderate cervical spondylosis with multilevel disc and facet degenerative changes. Other neck: Multinodular thyroid goiter with the left lobe of the thyroid measuring up to 7.5 cm and the right lobe measuring up to 6.1 cm. There is mass effect on the airway which is deviated to the right. The left lobe of the thyroid extends into the upper mediastinum. Upper chest: Negative. Review of the MIP images confirms the above findings CTA HEAD FINDINGS Anterior circulation: Calcific atherosclerosis of carotid siphons with mild bilateral paraclinoid stenosis. Patent bilateral ACA and MCA. No large vessel occlusion, aneurysm, or significant stenosis is identified. Posterior circulation: No significant stenosis, proximal occlusion, aneurysm, or vascular malformation. Venous sinuses: As permitted by contrast timing, patent. Anatomic variants: Large left A1, anterior communicating artery, and diminutive right A1, normal variant. No posterior communicating artery identified, likely hypoplastic or absent. Delayed phase: No abnormal intracranial enhancement. Review of the MIP images confirms the above findings IMPRESSION: 1. Small infarct in the left thalamus stable from prior MRI. Very small infarcts in the right cerebellum  are poorly visualized on CT. No new acute intracranial abnormality. 2. Patent carotid and vertebral arteries. No dissection, aneurysm, or hemodynamically significant stenosis utilizing NASCET criteria. 3. Patent anterior and posterior intracranial circulation. No large vessel occlusion, aneurysm, or significant stenosis. 4. Mild calcific atherosclerosis of the aortic arch, carotid bifurcations, and carotid siphons. 5. Multinodular thyroid goiter. Thyroid ultrasound is recommended on a nonemergent basis. Electronically Signed   By: LKristine GarbeM.D.   On: 11/24/2017 23:30   Ct Angio Neck W And/or Wo Contrast  Result Date: 11/24/2017 CLINICAL DATA:  67 y/o F; dizziness and weakness. Evaluation of stroke. EXAM: CT ANGIOGRAPHY HEAD AND NECK TECHNIQUE: Multidetector CT imaging of the head and neck was performed using the standard protocol during bolus administration of intravenous contrast. Multiplanar CT image reconstructions and MIPs were obtained to evaluate the vascular anatomy. Carotid stenosis measurements (when applicable) are obtained utilizing NASCET criteria, using the distal internal carotid diameter as the denominator. CONTRAST:  135m ISOVUE-370 IOPAMIDOL (ISOVUE-370) INJECTION 76% COMPARISON:  11/24/2017 MRI of the head. FINDINGS: CT HEAD FINDINGS Brain: Small lucency within left lateral thalamus corresponding to infarct on MRI of the head. No new intracranial hemorrhage, stroke, or focal mass effect of the brain. Very small infarcts within the right cerebellum are poorly visualized on CT. No extra-axial collection, hydrocephalus, or herniation. Vascular: As below. Skull: Normal. Negative for fracture or focal lesion. Sinuses: Imaged portions are clear. Orbits: No acute finding. Review of the MIP images confirms the above findings CTA NECK FINDINGS Aortic arch: Standard branching. Imaged portion shows no evidence of aneurysm or dissection. No significant stenosis of the major arch vessel  origins. Mild calcific atherosclerosis of the aortic arch. Right carotid system: No evidence of dissection, stenosis (50% or greater) or occlusion. Left carotid system: No evidence of dissection, stenosis (50% or greater) or occlusion. Mild non stenotic calcific atherosclerosis of the carotid bifurcation. Vertebral arteries: Codominant. No evidence of dissection, stenosis (50% or greater) or occlusion. Skeleton: Moderate cervical spondylosis with multilevel disc and facet degenerative changes. Other neck: Multinodular thyroid goiter with the left lobe of the thyroid measuring up to 7.5 cm and the right lobe measuring up to 6.1 cm. There is mass effect on the airway which is deviated to the right. The left lobe of the thyroid extends into the upper mediastinum. Upper chest: Negative. Review of the MIP images confirms the above findings CTA HEAD FINDINGS Anterior circulation: Calcific atherosclerosis of carotid siphons with mild bilateral paraclinoid stenosis. Patent bilateral ACA and MCA. No large vessel occlusion, aneurysm, or significant stenosis is identified. Posterior circulation: No significant stenosis, proximal occlusion, aneurysm, or vascular malformation. Venous sinuses: As permitted by contrast timing, patent. Anatomic variants: Large left A1, anterior communicating artery, and diminutive right A1, normal variant. No posterior communicating artery identified, likely hypoplastic or absent. Delayed phase: No abnormal intracranial enhancement. Review of the MIP images confirms the above findings IMPRESSION: 1. Small infarct in the left thalamus stable from prior MRI. Very small infarcts in the right cerebellum are poorly visualized on CT. No new acute intracranial abnormality. 2. Patent carotid and vertebral arteries. No dissection, aneurysm, or hemodynamically significant stenosis utilizing NASCET criteria. 3. Patent anterior and posterior intracranial circulation. No large vessel occlusion, aneurysm, or  significant stenosis. 4. Mild calcific atherosclerosis of the aortic arch, carotid bifurcations, and carotid siphons. 5. Multinodular thyroid goiter. Thyroid ultrasound is recommended on a nonemergent basis. Electronically Signed   By: LKristine GarbeM.D.   On: 11/24/2017 23:30   Mr Brain Wo Contrast  Result Date: 11/24/2017 CLINICAL DATA:  Leg weakness, presyncope. Vertigo with bending over. RIGHT eye blurry vision. History of hypertension, hyperlipidemia. EXAM: MRI HEAD WITHOUT CONTRAST TECHNIQUE: Multiplanar, multiecho pulse sequences of the brain and surrounding structures were obtained without intravenous contrast. COMPARISON:  None. FINDINGS: INTRACRANIAL CONTENTS: 2 RIGHT M1 LEFT cerebellar foci of reduced diffusion with low ADC values. Subcentimeter LEFT thalamus reduced diffusion with low ADC values. No susceptibility artifact to suggest hemorrhage. No parenchymal brain volume loss for age. No midline  shift, mass effect or masses. No significant white matter changes for age. No abnormal extra-axial fluid collections. VASCULAR: Normal major intracranial vascular flow voids present at skull base. SKULL AND UPPER CERVICAL SPINE: Borderline empty sella. No suspicious calvarial bone marrow signal. Craniocervical junction maintained. SINUSES/ORBITS: The mastoid air-cells and included paranasal sinuses are well-aerated.The included ocular globes and orbital contents are non-suspicious. OTHER: Patient is edentulous. IMPRESSION: 1. Acute bilateral cerebella and LEFT thalamus subcentimeter nonhemorrhagic infarcts. Findings may be related to vertebrobasilar insufficiency or, small vessel ischemic disease. Consider CTA HEAD and neck for further evaluation. 2. Otherwise negative noncontrast MRI head for age. 3. Acute findings discussed with and reconfirmed by Dr.DAVID YAO on 11/24/2017 at 10:20 pm. Electronically Signed   By: Elon Alas M.D.   On: 11/24/2017 22:26    Assessment: 67 y.o. female  with acute left thalamic lacunar infarction and bilateral subcentimeter cerebellar ischemic infarctions.  1. Exam reveals findings referable to the acute left thalamic ischemic infarction.  2. MRI reveals an acute left thalamic subcentimeter ischemic infarction. Radiology report also mentions "bilateral" and "acute" cerebellar infarcts, but on my review of the images they appear to be on the right side only and also are most likely subacute given corresponding hyperintensity on T2-weighted images. Findings may be related to vertebrobasilar insufficiency or small vessel ischemic disease.  3. Stroke Risk Factors - HLD and HTN  Plan: 1. HgbA1c, fasting lipid panel 2. MRA of the brain without contrast 3. PT consult, OT consult, Speech consult 4. Echocardiogram 5. Carotid dopplers 6. Agree with starting ASA 7. Risk factor modification 8. Telemetry monitoring 9. Frequent neuro checks 10. Continue atorvastatin at higher dose of 80 mg qd 11. BP management   @Electronically  signed: Dr. Kerney Elbe  11/25/2017, 2:03 AM

## 2017-11-25 NOTE — Progress Notes (Signed)
OT Cancellation Note  Patient Details Name: Susan Nichols MRN: [id number] DOB: Jul 28, 1950   Cancelled Treatment:    Reason Eval/Treat Not Completed: Patient at procedure or test/ unavailable(Echo)  [name] [name] 11/25/2017, 10:07 AM  [name] OTR/L [phone]

## 2017-11-25 NOTE — Evaluation (Signed)
Occupational Therapy Evaluation Patient Details Name: Susan Nichols MRN: [id number] DOB: [birth year] Today's Date: 11/25/2017    History of Present Illness Susan Nichols is an 67 y.o. female  has a past medical history of Hyperlipemia and Hypertension. Pt presented with a c/c of room-spinning dizziness precipitated by bending over in conjunction with new RUE and RLE weakness and gait imbalance. She also reported blurring in her right eye. MRI revealed sub acute right cerebellar and left thalamus subcentimeter nonhemorrhagic infarcts   Clinical Impression   PTA Pt independent in ADL and mobility. Enjoys grandchildren. Pt is currently min guard for standing ADL, and seated LB ADL. Pt with decreased coordination in [place] (can do but requires increased time/effort) and decreased knowledge of DME and AE. Pt will benefit from skilled OT in the acute setting and afterwards at [facility] level to maximize safety and independence in ADL and functional transfers. Next session to focus on dynamic balance during various ADL.     Follow Up Recommendations  Home health OT    Equipment Recommendations  3 in 1 bedside commode    Recommendations for Other Services       Precautions / Restrictions Precautions Precautions: Fall Restrictions Weight Bearing Restrictions: No      Mobility Bed Mobility Overal bed mobility: Modified Independent                Transfers Overall transfer level: Needs assistance Equipment used: Rolling walker (2 wheeled);1 person hand held assist Transfers: Sit to/from Stand Sit to Stand: Min assist         General transfer comment: assist for steady/balance and boost    Balance Overall balance assessment: Needs assistance Sitting-balance support: No upper extremity supported;Feet supported Sitting balance-Leahy Scale: Fair     Standing balance support: Bilateral upper extremity supported;During functional activity Standing balance-Leahy Scale:  Poor Standing balance comment: requires RW                           ADL either performed or assessed with clinical judgement   ADL Overall ADL's : Needs assistance/impaired Eating/Feeding: Modified independent;Sitting   Grooming: Wash/dry face;Oral care;Wash/dry hands;Min guard;Standing Grooming Details (indicate cue type and reason): sink level Upper Body Bathing: Min guard;Sitting   Lower Body Bathing: Min guard;Sitting/lateral leans   Upper Body Dressing : Set up;Sitting   Lower Body Dressing: Minimal assistance;Sit to/from stand Lower Body Dressing Details (indicate cue type and reason): able to don socks EOB min guard, assist for clothing items taht require sit<>stand Toilet Transfer: Min guard;Minimal assistance;Ambulation;RW   Toileting- Water quality scientist and Hygiene: Min guard;Sit to/from stand       Functional mobility during ADLs: Min guard;Minimal assistance;Cueing for safety;Rolling walker General ADL Comments: decreased awareness with DME, decreased balance and activity tolerance     Vision Baseline Vision/History: Wears glasses Patient Visual Report: No change from baseline Vision Assessment?: Yes Eye Alignment: Within Functional Limits Ocular Range of Motion: Within Functional Limits Alignment/Gaze Preference: Within Defined Limits Tracking/Visual Pursuits: Able to track stimulus in all quads without difficulty Convergence: Within functional limits Visual Fields: No apparent deficits     Perception     Praxis      Pertinent Vitals/Pain Pain Assessment: No/denies pain     Hand Dominance Right   Extremity/Trunk Assessment Upper Extremity Assessment Upper Extremity Assessment: Generalized weakness   Lower Extremity Assessment Lower Extremity Assessment: Defer to PT evaluation   Cervical / Trunk Assessment Cervical / Trunk Assessment: Normal  Communication Communication Communication: No difficulties   Cognition  Arousal/Alertness: Awake/alert Behavior During Therapy: WFL for tasks assessed/performed Overall Cognitive Status: Within Functional Limits for tasks assessed                                     General Comments  daughter, son, and 2 grandsons present during session at Pt request    Exercises     Shoulder Instructions      Home Living Family/patient expects to be discharged to:: Private residence Living Arrangements: Spouse/significant other;Children(adult son) Available Help at Discharge: Family;Available 24 hours/day Type of Home: House Home Access: Stairs to enter [employer] of Steps: 1 Entrance Stairs-Rails: None Home Layout: One level     Bathroom Shower/Tub: Teacher, early years/pre: Standard     Home Equipment: Environmental consultant - 2 wheels;Kasandra Knudsen - single point      Lives With: Spouse    Prior Functioning/Environment Level of Independence: Independent        Comments: has not needed AD        OT Problem List: Decreased strength;Decreased activity tolerance;Impaired balance (sitting and/or standing);Decreased knowledge of use of DME or AE;Impaired sensation      OT Treatment/Interventions: Self-care/ADL training;Neuromuscular education;Energy conservation;DME and/or AE instruction;Therapeutic activities;Patient/family education;Balance training    OT Goals(Current goals can be found in the care plan section) Acute Rehab OT Goals Patient Stated Goal: "to feel better and get home" OT Goal Formulation: With patient Time For Goal Achievement: 12/07/17 Potential to Achieve Goals: Good ADL Goals Pt Will Perform Grooming: with modified independence;standing Pt Will Perform Upper Body Dressing: with modified independence;sitting Pt Will Perform Lower Body Dressing: with modified independence;sit to/from stand Pt Will Transfer to Toilet: with modified independence;ambulating Pt Will Perform Toileting - Clothing Manipulation and hygiene:  with modified independence;sit to/from stand  OT Frequency: Min 2X/week   Barriers to D/C:            Co-evaluation              AM-PAC PT "6 Clicks" Daily Activity     Outcome Measure Help from another person eating meals?: None Help from another person taking care of personal grooming?: A Little Help from another person toileting, which includes using toliet, bedpan, or urinal?: A Little Help from another person bathing (including washing, rinsing, drying)?: A Little Help from another person to put on and taking off regular upper body clothing?: None Help from another person to put on and taking off regular lower body clothing?: A Little 6 Click Score: 20   End of Session Equipment Utilized During Treatment: Gait belt;Rolling walker Nurse Communication: Mobility status  Activity Tolerance: Patient tolerated treatment well Patient left: in bed;with call bell/phone within reach;with bed alarm set;with family/visitor present  OT Visit Diagnosis: Unsteadiness on feet (R26.81);Other abnormalities of gait and mobility (R26.89);Muscle weakness (generalized) (M62.81)                Time: 6384-6659 OT Time Calculation (min): 32 min Charges:  OT General Charges $OT Visit: 1 Visit OT Evaluation $OT Eval Moderate Complexity: 1 Mod OT Treatments $Self Care/Home Management : 8-22 mins G-Codes:     [name] OTR/L [place] 11/25/2017, 6:18 PM

## 2017-11-25 NOTE — ED Notes (Signed)
ED TO INPATIENT HANDOFF REPORT  Name/Age/Gender Susan Nichols 67 y.o. female  Code Status    Code Status Orders  (From admission, onward)        Start     Ordered   11/24/17 2325  Full code  Continuous     11/24/17 2325    Code Status History    This patient has a current code status but no historical code status.      Home/SNF/Other Home  Chief Complaint right side weakness   Level of Care/Admitting Diagnosis ED Disposition    ED Disposition Condition [facility]: [place] [100100]  Level of Care: Telemetry [5]  Diagnosis: Stroke [facility]) [[id number]]  Admitting Physician: Ivor Costa [[id number]]  Attending Physician: Ivor Costa [phone]  Estimated length of stay: past midnight tomorrow  Certification:: I certify this patient will need inpatient services for at least 2 midnights  PT Class (Do Not Modify): Inpatient [101]  PT Acc Code (Do Not Modify): Private [1]       Medical History Past Medical History:  Diagnosis Date  . Hyperlipemia   . Hypertension     Allergies No Known Allergies  IV Location/Drains/Wounds Patient Lines/Drains/Airways Status   Active Line/Drains/Airways    Name:   Placement date:   Placement time:   Site:   Days:   Peripheral IV 11/24/17 Left Antecubital   11/24/17    2221    Antecubital   1          Labs/Imaging Results for orders placed or performed during the hospital encounter of 11/24/17 (from the past 48 hour(s))  Urinalysis, Routine w reflex microscopic     Status: Abnormal   Collection Time: 11/24/17  6:13 PM  Result Value Ref Range   Color, Urine YELLOW YELLOW   APPearance CLEAR CLEAR   Specific Gravity, Urine 1.013 1.005 - 1.030   pH 5.0 5.0 - 8.0   Glucose, UA NEGATIVE NEGATIVE mg/dL   Hgb urine dipstick MODERATE (A) NEGATIVE   Bilirubin Urine NEGATIVE NEGATIVE   Ketones, ur NEGATIVE NEGATIVE mg/dL   Protein, ur NEGATIVE NEGATIVE mg/dL   Nitrite POSITIVE (A) NEGATIVE    Leukocytes, UA NEGATIVE NEGATIVE   RBC / HPF 0-5 0 - 5 RBC/hpf   WBC, UA 6-10 0 - 5 WBC/hpf   Bacteria, UA MANY (A) NONE SEEN   Squamous Epithelial / LPF 6-10 0 - 5    Comment: Performed at [facility], [place] [address] [place], [place] [address]  Basic metabolic panel     Status: Abnormal   Collection Time: 11/24/17  6:56 PM  Result Value Ref Range   Sodium 144 135 - 145 mmol/L   Potassium 4.3 3.5 - 5.1 mmol/L   Chloride 110 98 - 111 mmol/L    Comment: Please note change in reference range.   CO2 26 22 - 32 mmol/L   Glucose, Bld 104 (H) 70 - 99 mg/dL    Comment: Please note change in reference range.   BUN 20 8 - 23 mg/dL    Comment: Please note change in reference range.   Creatinine, Ser 1.09 (H) 0.44 - 1.00 mg/dL   Calcium 10.2 8.9 - 10.3 mg/dL   GFR calc non Af Amer 51 (L) >60 mL/min   GFR calc Af Amer 59 (L) >60 mL/min    Comment: (NOTE) The eGFR has been calculated using the CKD EPI equation. This calculation has not been validated in all  clinical situations. eGFR's persistently <60 mL/min signify possible Chronic Kidney Disease.    Anion gap 8 5 - 15    Comment: Performed at [facility], [place] [address] [place], [place] [address]  CBC     Status: Abnormal   Collection Time: 11/24/17  6:56 PM  Result Value Ref Range   WBC 5.9 4.0 - 10.5 K/uL   RBC 4.07 3.87 - 5.11 MIL/uL   Hemoglobin 11.3 (L) 12.0 - 15.0 g/dL   HCT 35.0 (L) 36.0 - 46.0 %   MCV 86.0 78.0 - 100.0 fL   MCH 27.8 26.0 - 34.0 pg   MCHC 32.3 30.0 - 36.0 g/dL   RDW 14.3 11.5 - 15.5 %   Platelets 275 150 - 400 K/uL    Comment: Performed at [facility], [place] [address], [place], [place] [address]   Ct Angio Head W Or Wo Contrast  Result Date: 11/24/2017 CLINICAL DATA:  67 y/o F; dizziness and weakness. Evaluation of stroke. EXAM: CT ANGIOGRAPHY HEAD AND NECK TECHNIQUE: Multidetector CT imaging of the head and neck was performed using the standard  protocol during bolus administration of intravenous contrast. Multiplanar CT image reconstructions and MIPs were obtained to evaluate the vascular anatomy. Carotid stenosis measurements (when applicable) are obtained utilizing NASCET criteria, using the distal internal carotid diameter as the denominator. CONTRAST:  127m ISOVUE-370 IOPAMIDOL (ISOVUE-370) INJECTION 76% COMPARISON:  11/24/2017 MRI of the head. FINDINGS: CT HEAD FINDINGS Brain: Small lucency within left lateral thalamus corresponding to infarct on MRI of the head. No new intracranial hemorrhage, stroke, or focal mass effect of the brain. Very small infarcts within the right cerebellum are poorly visualized on CT. No extra-axial collection, hydrocephalus, or herniation. Vascular: As below. Skull: Normal. Negative for fracture or focal lesion. Sinuses: Imaged portions are clear. Orbits: No acute finding. Review of the MIP images confirms the above findings CTA NECK FINDINGS Aortic arch: Standard branching. Imaged portion shows no evidence of aneurysm or dissection. No significant stenosis of the major arch vessel origins. Mild calcific atherosclerosis of the aortic arch. Right carotid system: No evidence of dissection, stenosis (50% or greater) or occlusion. Left carotid system: No evidence of dissection, stenosis (50% or greater) or occlusion. Mild non stenotic calcific atherosclerosis of the carotid bifurcation. Vertebral arteries: Codominant. No evidence of dissection, stenosis (50% or greater) or occlusion. Skeleton: Moderate cervical spondylosis with multilevel disc and facet degenerative changes. Other neck: Multinodular thyroid goiter with the left lobe of the thyroid measuring up to 7.5 cm and the right lobe measuring up to 6.1 cm. There is mass effect on the airway which is deviated to the right. The left lobe of the thyroid extends into the upper mediastinum. Upper chest: Negative. Review of the MIP images confirms the above findings CTA HEAD  FINDINGS Anterior circulation: Calcific atherosclerosis of carotid siphons with mild bilateral paraclinoid stenosis. Patent bilateral ACA and MCA. No large vessel occlusion, aneurysm, or significant stenosis is identified. Posterior circulation: No significant stenosis, proximal occlusion, aneurysm, or vascular malformation. Venous sinuses: As permitted by contrast timing, patent. Anatomic variants: Large left A1, anterior communicating artery, and diminutive right A1, normal variant. No posterior communicating artery identified, likely hypoplastic or absent. Delayed phase: No abnormal intracranial enhancement. Review of the MIP images confirms the above findings IMPRESSION: 1. Small infarct in the left thalamus stable from prior MRI. Very small infarcts in the right cerebellum are poorly visualized on CT. No new acute intracranial abnormality. 2. Patent carotid  and vertebral arteries. No dissection, aneurysm, or hemodynamically significant stenosis utilizing NASCET criteria. 3. Patent anterior and posterior intracranial circulation. No large vessel occlusion, aneurysm, or significant stenosis. 4. Mild calcific atherosclerosis of the aortic arch, carotid bifurcations, and carotid siphons. 5. Multinodular thyroid goiter. Thyroid ultrasound is recommended on a nonemergent basis. Electronically Signed   By: Kristine Garbe M.D.   On: 11/24/2017 23:30   Ct Angio Neck W And/or Wo Contrast  Result Date: 11/24/2017 CLINICAL DATA:  67 y/o F; dizziness and weakness. Evaluation of stroke. EXAM: CT ANGIOGRAPHY HEAD AND NECK TECHNIQUE: Multidetector CT imaging of the head and neck was performed using the standard protocol during bolus administration of intravenous contrast. Multiplanar CT image reconstructions and MIPs were obtained to evaluate the vascular anatomy. Carotid stenosis measurements (when applicable) are obtained utilizing NASCET criteria, using the distal internal carotid diameter as the denominator.  CONTRAST:  126m ISOVUE-370 IOPAMIDOL (ISOVUE-370) INJECTION 76% COMPARISON:  11/24/2017 MRI of the head. FINDINGS: CT HEAD FINDINGS Brain: Small lucency within left lateral thalamus corresponding to infarct on MRI of the head. No new intracranial hemorrhage, stroke, or focal mass effect of the brain. Very small infarcts within the right cerebellum are poorly visualized on CT. No extra-axial collection, hydrocephalus, or herniation. Vascular: As below. Skull: Normal. Negative for fracture or focal lesion. Sinuses: Imaged portions are clear. Orbits: No acute finding. Review of the MIP images confirms the above findings CTA NECK FINDINGS Aortic arch: Standard branching. Imaged portion shows no evidence of aneurysm or dissection. No significant stenosis of the major arch vessel origins. Mild calcific atherosclerosis of the aortic arch. Right carotid system: No evidence of dissection, stenosis (50% or greater) or occlusion. Left carotid system: No evidence of dissection, stenosis (50% or greater) or occlusion. Mild non stenotic calcific atherosclerosis of the carotid bifurcation. Vertebral arteries: Codominant. No evidence of dissection, stenosis (50% or greater) or occlusion. Skeleton: Moderate cervical spondylosis with multilevel disc and facet degenerative changes. Other neck: Multinodular thyroid goiter with the left lobe of the thyroid measuring up to 7.5 cm and the right lobe measuring up to 6.1 cm. There is mass effect on the airway which is deviated to the right. The left lobe of the thyroid extends into the upper mediastinum. Upper chest: Negative. Review of the MIP images confirms the above findings CTA HEAD FINDINGS Anterior circulation: Calcific atherosclerosis of carotid siphons with mild bilateral paraclinoid stenosis. Patent bilateral ACA and MCA. No large vessel occlusion, aneurysm, or significant stenosis is identified. Posterior circulation: No significant stenosis, proximal occlusion, aneurysm, or  vascular malformation. Venous sinuses: As permitted by contrast timing, patent. Anatomic variants: Large left A1, anterior communicating artery, and diminutive right A1, normal variant. No posterior communicating artery identified, likely hypoplastic or absent. Delayed phase: No abnormal intracranial enhancement. Review of the MIP images confirms the above findings IMPRESSION: 1. Small infarct in the left thalamus stable from prior MRI. Very small infarcts in the right cerebellum are poorly visualized on CT. No new acute intracranial abnormality. 2. Patent carotid and vertebral arteries. No dissection, aneurysm, or hemodynamically significant stenosis utilizing NASCET criteria. 3. Patent anterior and posterior intracranial circulation. No large vessel occlusion, aneurysm, or significant stenosis. 4. Mild calcific atherosclerosis of the aortic arch, carotid bifurcations, and carotid siphons. 5. Multinodular thyroid goiter. Thyroid ultrasound is recommended on a nonemergent basis. Electronically Signed   By: LKristine GarbeM.D.   On: 11/24/2017 23:30   Mr Brain Wo Contrast  Result Date: 11/24/2017 CLINICAL DATA:  Leg weakness,  presyncope. Vertigo with bending over. RIGHT eye blurry vision. History of hypertension, hyperlipidemia. EXAM: MRI HEAD WITHOUT CONTRAST TECHNIQUE: Multiplanar, multiecho pulse sequences of the brain and surrounding structures were obtained without intravenous contrast. COMPARISON:  None. FINDINGS: INTRACRANIAL CONTENTS: 2 RIGHT M1 LEFT cerebellar foci of reduced diffusion with low ADC values. Subcentimeter LEFT thalamus reduced diffusion with low ADC values. No susceptibility artifact to suggest hemorrhage. No parenchymal brain volume loss for age. No midline shift, mass effect or masses. No significant white matter changes for age. No abnormal extra-axial fluid collections. VASCULAR: Normal major intracranial vascular flow voids present at skull base. SKULL AND UPPER CERVICAL  SPINE: Borderline empty sella. No suspicious calvarial bone marrow signal. Craniocervical junction maintained. SINUSES/ORBITS: The mastoid air-cells and included paranasal sinuses are well-aerated.The included ocular globes and orbital contents are non-suspicious. OTHER: Patient is edentulous. IMPRESSION: 1. Acute bilateral cerebella and LEFT thalamus subcentimeter nonhemorrhagic infarcts. Findings may be related to vertebrobasilar insufficiency or, small vessel ischemic disease. Consider CTA HEAD and neck for further evaluation. 2. Otherwise negative noncontrast MRI head for age. 3. Acute findings discussed with and reconfirmed by Dr.DAVID YAO on 11/24/2017 at 10:20 pm. Electronically Signed   By: Elon Alas M.D.   On: 11/24/2017 22:26    Pending Labs Unresulted Labs (From admission, onward)   Start     Ordered   11/25/17 0500  Hemoglobin A1c  Tomorrow morning,   R     11/24/17 2325   11/25/17 0500  Lipid panel  Tomorrow morning,   R    Comments:  Fasting    11/24/17 2325   11/24/17 2324  HIV antibody (Routine Testing)  Once,   R     11/24/17 2325   11/24/17 2323  Urine Culture  Add-on,   R    Comments:  Please get clean catch specimen ONLY (DO NOT obtain from urinal)    11/24/17 2322   11/24/17 2323  Culture, blood (Routine X 2) w Reflex to ID Panel  BLOOD CULTURE X 2,   R    Comments:  Please obtain prior to antibiotic administration.    11/24/17 2322      Vitals/Pain Today's Vitals   11/24/17 2031 11/24/17 2116 11/24/17 2328 11/24/17 2344  BP: (!) 152/68   (!) 169/93  Pulse: 81   (!) 105  Resp: 18   19  Temp: 99.1 F (37.3 C)  98.7 F (37.1 C)   TempSrc: Oral     SpO2: 99%   100%  Weight:      Height:      PainSc:  0-No pain      Isolation Precautions No active isolations  Medications Medications  iopamidol (ISOVUE-370) 76 % injection (has no administration in time range)  cefTRIAXone (ROCEPHIN) 1 g in sodium chloride 0.9 % 100 mL IVPB (has no administration  in time range)  meclizine (ANTIVERT) tablet 12.5 mg (has no administration in time range)  ALPRAZolam (XANAX) tablet 0.25 mg (has no administration in time range)  atorvastatin (LIPITOR) tablet 80 mg (has no administration in time range)  HYDROcodone-acetaminophen (NORCO/VICODIN) 5-325 MG per tablet 1 tablet (has no administration in time range)  isosorbide mononitrate (IMDUR) 24 hr tablet 30 mg (has no administration in time range)  hydrALAZINE (APRESOLINE) injection 5 mg (has no administration in time range)  ondansetron (ZOFRAN) injection 4 mg (has no administration in time range)  acetaminophen (TYLENOL) tablet 650 mg (has no administration in time range)  zolpidem (AMBIEN) tablet 5 mg (has  no administration in time range)   stroke: mapping our early stages of recovery book (has no administration in time range)  0.9 %  sodium chloride infusion (has no administration in time range)  senna-docusate (Senokot-S) tablet 1 tablet (has no administration in time range)  enoxaparin (LOVENOX) injection 40 mg (40 mg Subcutaneous Given 11/25/17 0008)  aspirin suppository 300 mg (has no administration in time range)    Or  aspirin tablet 325 mg (has no administration in time range)  sodium chloride 0.9 % bolus 1,000 mL (1,000 mLs Intravenous New Bag/Given 11/24/17 2219)  meclizine (ANTIVERT) tablet 25 mg (25 mg Oral Given 11/24/17 2126)  cefTRIAXone (ROCEPHIN) 1 g in sodium chloride 0.9 % 100 mL IVPB (0 g Intravenous Stopped 11/24/17 2316)  iopamidol (ISOVUE-370) 76 % injection 100 mL (100 mLs Intravenous Contrast Given 11/24/17 2248)    Mobility walks

## 2017-11-25 NOTE — Progress Notes (Signed)
Patient Demographics:    Susan Nichols, is a 67 y.o. female, DOB - 1951-03-07, PJA:[id number]  Admit date - 11/24/2017   Admitting Physician Ivor Costa, MD  Outpatient Primary MD for the patient is Lucianne Lei, MD  LOS - 1   Chief Complaint  Patient presents with  . Extremity Weakness  . Dizziness        Subjective:    Susan Nichols today has no fevers, no emesis,  No chest pain, eating and drinking well no shob  Assessment  & Plan :    Principal Problem:   Stroke [facility]) Active Problems:   Hyperlipemia   Hypertension   Anxiety   Thyroid goiter   UTI (urinary tract infection)  Brief summary 67 y.o. female with past medical history relevant for hypertension and dyslipidemia admitted on 11/24/2017 with acute left thalamic lacunar infarction and bilateral subcentimeter cerebellar ischemic infarctions.    Plan:- 1)Acute Stroke/Acute left thalamic lacunar infarction and bilateral subcentimeter cerebellar ischemic infarctions----Neurology consult appreciated, PT consult appreciated, treat empirically with aspirin and Plavix for 3 weeks and then switch to Plavix after that per neurology recommendation, continue Lipitor 80 mg daily, echocardiogram without intracardiac thrombus, patient will need 30-day Holter monitor as outpatient to rule out possible A. Fib, fasting lipid profile and A1c pending  2)Gait disturbance/unsteady gait/right-sided weakness--PT recommends home health therapy  3) possible UTI--- continue IV Rocephin pending cultures   Code Status : full code   Disposition Plan  : Home health services  Consults  : Neurology   DVT Prophylaxis  :  Lovenox   Lab Results  Component Value Date   PLT 275 11/24/2017    Inpatient Medications  Scheduled Meds: . [START ON 11/26/2017] aspirin  81 mg Oral Daily  . atorvastatin  80 mg Oral Daily  . clopidogrel  75 mg Oral Daily  .  enoxaparin (LOVENOX) injection  40 mg Subcutaneous Q24H  . isosorbide mononitrate  30 mg Oral Daily   Continuous Infusions: . sodium chloride 75 mL/hr at 11/25/17 1427  . cefTRIAXone (ROCEPHIN)  IV     PRN Meds:.acetaminophen, ALPRAZolam, hydrALAZINE, HYDROcodone-acetaminophen, meclizine, ondansetron, senna-docusate, zolpidem    Anti-infectives (From admission, onward)   Start     Dose/Rate Route Frequency Ordered Stop   11/25/17 2100  cefTRIAXone (ROCEPHIN) 1 g in sodium chloride 0.9 % 100 mL IVPB     1 g 200 mL/hr over 30 Minutes Intravenous Every 24 hours 11/24/17 2320     11/24/17 2145  cefTRIAXone (ROCEPHIN) 1 g in sodium chloride 0.9 % 100 mL IVPB     1 g 200 mL/hr over 30 Minutes Intravenous  Once 11/24/17 2138 11/24/17 2316        Objective:   Vitals:   11/25/17 0323 11/25/17 0416 11/25/17 0737 11/25/17 1204  BP: (!) 159/68 (!) 155/66 (!) 177/58 (!) 169/68  Pulse: 89 87 75 85  Resp: 17 18 20 18   Temp: 97.6 F (36.4 C) 98.1 F (36.7 C) 98 F (36.7 C) 97.7 F (36.5 C)  TempSrc: Oral Oral Oral Oral  SpO2: 98% 96% 96% 99%  Weight:      Height:        Wt Readings from Last 3 Encounters:  11/25/17 100.3 kg (221 lb  1.9 oz)  09/11/14 104.2 kg (229 lb 11.2 oz)  06/06/14 103.4 kg (227 lb 14.4 oz)     Intake/Output Summary (Last 24 hours) at 11/25/2017 1503 Last data filed at 11/25/2017 1211 Gross per 24 hour  Intake 2432.65 ml  Output -  Net 2432.65 ml     Physical Exam  Gen:- Awake Alert, in no acute distress HEENT:- [place].AT, No sclera icterus Neck-Supple Neck,No JVD,.  Lungs-  CTAB , good air movement CV- S1, S2 normal, regular Abd-  +ve B.Sounds, Abd Soft, No tenderness,    Extremity/Skin:- No  edema,   good pulses Psych-affect is appropriate, oriented x3 Neuro-unsteady gait persist, right-sided weakness is improving   Data Review:   Micro Results No results found for this or any previous visit (from the past 240 hour(s)).  Radiology Reports Ct  Angio Head W Or Wo Contrast  Result Date: 11/24/2017 CLINICAL DATA:  67 y/o F; dizziness and weakness. Evaluation of stroke. EXAM: CT ANGIOGRAPHY HEAD AND NECK TECHNIQUE: Multidetector CT imaging of the head and neck was performed using the standard protocol during bolus administration of intravenous contrast. Multiplanar CT image reconstructions and MIPs were obtained to evaluate the vascular anatomy. Carotid stenosis measurements (when applicable) are obtained utilizing NASCET criteria, using the distal internal carotid diameter as the denominator. CONTRAST:  13mL ISOVUE-370 IOPAMIDOL (ISOVUE-370) INJECTION 76% COMPARISON:  11/24/2017 MRI of the head. FINDINGS: CT HEAD FINDINGS Brain: Small lucency within left lateral thalamus corresponding to infarct on MRI of the head. No new intracranial hemorrhage, stroke, or focal mass effect of the brain. Very small infarcts within the right cerebellum are poorly visualized on CT. No extra-axial collection, hydrocephalus, or herniation. Vascular: As below. Skull: Normal. Negative for fracture or focal lesion. Sinuses: Imaged portions are clear. Orbits: No acute finding. Review of the MIP images confirms the above findings CTA NECK FINDINGS Aortic arch: Standard branching. Imaged portion shows no evidence of aneurysm or dissection. No significant stenosis of the major arch vessel origins. Mild calcific atherosclerosis of the aortic arch. Right carotid system: No evidence of dissection, stenosis (50% or greater) or occlusion. Left carotid system: No evidence of dissection, stenosis (50% or greater) or occlusion. Mild non stenotic calcific atherosclerosis of the carotid bifurcation. Vertebral arteries: Codominant. No evidence of dissection, stenosis (50% or greater) or occlusion. Skeleton: Moderate cervical spondylosis with multilevel disc and facet degenerative changes. Other neck: Multinodular thyroid goiter with the left lobe of the thyroid measuring up to 7.5 cm and the  right lobe measuring up to 6.1 cm. There is mass effect on the airway which is deviated to the right. The left lobe of the thyroid extends into the upper mediastinum. Upper chest: Negative. Review of the MIP images confirms the above findings CTA HEAD FINDINGS Anterior circulation: Calcific atherosclerosis of carotid siphons with mild bilateral paraclinoid stenosis. Patent bilateral ACA and MCA. No large vessel occlusion, aneurysm, or significant stenosis is identified. Posterior circulation: No significant stenosis, proximal occlusion, aneurysm, or vascular malformation. Venous sinuses: As permitted by contrast timing, patent. Anatomic variants: Large left A1, anterior communicating artery, and diminutive right A1, normal variant. No posterior communicating artery identified, likely hypoplastic or absent. Delayed phase: No abnormal intracranial enhancement. Review of the MIP images confirms the above findings IMPRESSION: 1. Small infarct in the left thalamus stable from prior MRI. Very small infarcts in the right cerebellum are poorly visualized on CT. No new acute intracranial abnormality. 2. Patent carotid and vertebral arteries. No dissection, aneurysm, or hemodynamically significant  stenosis utilizing NASCET criteria. 3. Patent anterior and posterior intracranial circulation. No large vessel occlusion, aneurysm, or significant stenosis. 4. Mild calcific atherosclerosis of the aortic arch, carotid bifurcations, and carotid siphons. 5. Multinodular thyroid goiter. Thyroid ultrasound is recommended on a nonemergent basis. Electronically Signed   By: Kristine Garbe M.D.   On: 11/24/2017 23:30   Ct Angio Neck W And/or Wo Contrast  Result Date: 11/24/2017 CLINICAL DATA:  67 y/o F; dizziness and weakness. Evaluation of stroke. EXAM: CT ANGIOGRAPHY HEAD AND NECK TECHNIQUE: Multidetector CT imaging of the head and neck was performed using the standard protocol during bolus administration of intravenous  contrast. Multiplanar CT image reconstructions and MIPs were obtained to evaluate the vascular anatomy. Carotid stenosis measurements (when applicable) are obtained utilizing NASCET criteria, using the distal internal carotid diameter as the denominator. CONTRAST:  121mL ISOVUE-370 IOPAMIDOL (ISOVUE-370) INJECTION 76% COMPARISON:  11/24/2017 MRI of the head. FINDINGS: CT HEAD FINDINGS Brain: Small lucency within left lateral thalamus corresponding to infarct on MRI of the head. No new intracranial hemorrhage, stroke, or focal mass effect of the brain. Very small infarcts within the right cerebellum are poorly visualized on CT. No extra-axial collection, hydrocephalus, or herniation. Vascular: As below. Skull: Normal. Negative for fracture or focal lesion. Sinuses: Imaged portions are clear. Orbits: No acute finding. Review of the MIP images confirms the above findings CTA NECK FINDINGS Aortic arch: Standard branching. Imaged portion shows no evidence of aneurysm or dissection. No significant stenosis of the major arch vessel origins. Mild calcific atherosclerosis of the aortic arch. Right carotid system: No evidence of dissection, stenosis (50% or greater) or occlusion. Left carotid system: No evidence of dissection, stenosis (50% or greater) or occlusion. Mild non stenotic calcific atherosclerosis of the carotid bifurcation. Vertebral arteries: Codominant. No evidence of dissection, stenosis (50% or greater) or occlusion. Skeleton: Moderate cervical spondylosis with multilevel disc and facet degenerative changes. Other neck: Multinodular thyroid goiter with the left lobe of the thyroid measuring up to 7.5 cm and the right lobe measuring up to 6.1 cm. There is mass effect on the airway which is deviated to the right. The left lobe of the thyroid extends into the upper mediastinum. Upper chest: Negative. Review of the MIP images confirms the above findings CTA HEAD FINDINGS Anterior circulation: Calcific  atherosclerosis of carotid siphons with mild bilateral paraclinoid stenosis. Patent bilateral ACA and MCA. No large vessel occlusion, aneurysm, or significant stenosis is identified. Posterior circulation: No significant stenosis, proximal occlusion, aneurysm, or vascular malformation. Venous sinuses: As permitted by contrast timing, patent. Anatomic variants: Large left A1, anterior communicating artery, and diminutive right A1, normal variant. No posterior communicating artery identified, likely hypoplastic or absent. Delayed phase: No abnormal intracranial enhancement. Review of the MIP images confirms the above findings IMPRESSION: 1. Small infarct in the left thalamus stable from prior MRI. Very small infarcts in the right cerebellum are poorly visualized on CT. No new acute intracranial abnormality. 2. Patent carotid and vertebral arteries. No dissection, aneurysm, or hemodynamically significant stenosis utilizing NASCET criteria. 3. Patent anterior and posterior intracranial circulation. No large vessel occlusion, aneurysm, or significant stenosis. 4. Mild calcific atherosclerosis of the aortic arch, carotid bifurcations, and carotid siphons. 5. Multinodular thyroid goiter. Thyroid ultrasound is recommended on a nonemergent basis. Electronically Signed   By: Kristine Garbe M.D.   On: 11/24/2017 23:30   Mr Brain Wo Contrast  Result Date: 11/24/2017 CLINICAL DATA:  Leg weakness, presyncope. Vertigo with bending over. RIGHT eye blurry vision.  History of hypertension, hyperlipidemia. EXAM: MRI HEAD WITHOUT CONTRAST TECHNIQUE: Multiplanar, multiecho pulse sequences of the brain and surrounding structures were obtained without intravenous contrast. COMPARISON:  None. FINDINGS: INTRACRANIAL CONTENTS: 2 RIGHT M1 LEFT cerebellar foci of reduced diffusion with low ADC values. Subcentimeter LEFT thalamus reduced diffusion with low ADC values. No susceptibility artifact to suggest hemorrhage. No parenchymal  brain volume loss for age. No midline shift, mass effect or masses. No significant white matter changes for age. No abnormal extra-axial fluid collections. VASCULAR: Normal major intracranial vascular flow voids present at skull base. SKULL AND UPPER CERVICAL SPINE: Borderline empty sella. No suspicious calvarial bone marrow signal. Craniocervical junction maintained. SINUSES/ORBITS: The mastoid air-cells and included paranasal sinuses are well-aerated.The included ocular globes and orbital contents are non-suspicious. OTHER: Patient is edentulous. IMPRESSION: 1. Acute bilateral cerebella and LEFT thalamus subcentimeter nonhemorrhagic infarcts. Findings may be related to vertebrobasilar insufficiency or, small vessel ischemic disease. Consider CTA HEAD and neck for further evaluation. 2. Otherwise negative noncontrast MRI head for age. 3. Acute findings discussed with and reconfirmed by Dr.DAVID YAO on 11/24/2017 at 10:20 pm. Electronically Signed   By: Elon Alas M.D.   On: 11/24/2017 22:26   US Thyroid  Result Date: 11/25/2017 CLINICAL DATA:  Incidental on CT. Thyroid goiter noted by CT angiography of the neck. EXAM: THYROID ULTRASOUND TECHNIQUE: Ultrasound examination of the thyroid gland and adjacent soft tissues was performed. COMPARISON:  CTA of the neck on 11/24/2017 FINDINGS: Parenchymal Echotexture: Moderately heterogenous Isthmus: 1.4 cm Right lobe: 5.0 x 2.2 x 3.4 cm Left lobe: 7.8 x 5.0 x 7.1 cm _________________________________________________________ Estimated total number of nodules >/= 1 cm: 3 Number of spongiform nodules >/=  2 cm not described below (TR1): 0 Number of mixed cystic and solid nodules >/= 1.5 cm not described below (TR2): 0 _________________________________________________________ Nodule # 1: Location: Right; Mid Maximum size: 1.1 cm; Other 2 dimensions: 0.7 x 0.9 cm Composition: mixed cystic and solid (1) Echogenicity: isoechoic (1) Shape: not taller-than-wide (0) Margins:  smooth (0) Echogenic foci: none (0) ACR TI-RADS total points: 2. ACR TI-RADS risk category: TR2 (2 points). ACR TI-RADS recommendations: This nodule does NOT meet TI-RADS criteria for biopsy or dedicated follow-up. _________________________________________________________ Nodule # 2: Location: Right; Superior Maximum size: 1.8 cm; Other 2 dimensions: 1.2 x 1.8 cm Composition: spongiform (0) Echogenicity: anechoic (0) Shape: not taller-than-wide (0) Margins: smooth (0) Echogenic foci: none (0) ACR TI-RADS total points: 0. ACR TI-RADS risk category: TR1 (0-1 points). ACR TI-RADS recommendations: This nodule does NOT meet TI-RADS criteria for biopsy or dedicated follow-up. _________________________________________________________ Nodule # 3: Location: Right; Inferior Maximum size: 2.3 cm; Other 2 dimensions: 1.7 x 2.1 cm Composition: cystic/almost completely cystic (0) Echogenicity: isoechoic (1) Shape: not taller-than-wide (0) Margins: smooth (0) Echogenic foci: none (0) ACR TI-RADS total points: 1. ACR TI-RADS risk category: TR1 (0-1 points). ACR TI-RADS recommendations: This nodule does NOT meet TI-RADS criteria for biopsy or dedicated follow-up. _________________________________________________________ The left lobe is diffusely enlarged and heterogeneous in appearance without discrete nodule. No enlarged lymph nodes identified. IMPRESSION: Thyroid goiter with diffusely enlarged left lobe. Nodules in the right lobe are of low suspicion based on TI-RADS criteria and do not meet criteria for biopsy or further dedicated follow-up. The above is in keeping with the ACR TI-RADS recommendations - J Am Coll Radiol 2017;14:587-595. Electronically Signed   By: Aletta Edouard M.D.   On: 11/25/2017 13:15     CBC Recent Labs  Lab 11/24/17 1856  WBC 5.9  HGB 11.3*  HCT 35.0*  PLT 275  MCV 86.0  MCH 27.8  MCHC 32.3  RDW 14.3    Chemistries  Recent Labs  Lab 11/24/17 1856  NA 144  K 4.3  CL 110  CO2 26    GLUCOSE 104*  BUN 20  CREATININE 1.09*  CALCIUM 10.2   ------------------------------------------------------------------------------------------------------------------ No results for input(s): CHOL, HDL, LDLCALC, TRIG, CHOLHDL, LDLDIRECT in the last 72 hours.  No results found for: HGBA1C ------------------------------------------------------------------------------------------------------------------ Recent Labs    11/25/17 0334  TSH 0.502   ------------------------------------------------------------------------------------------------------------------ No results for input(s): VITAMINB12, FOLATE, FERRITIN, TIBC, IRON, RETICCTPCT in the last 72 hours.  Coagulation profile No results for input(s): INR, PROTIME in the last 168 hours.  No results for input(s): DDIMER in the last 72 hours.  Cardiac Enzymes No results for input(s): CKMB, TROPONINI, MYOGLOBIN in the last 168 hours.  Invalid input(s): CK ------------------------------------------------------------------------------------------------------------------ No results found for: BNP   Roxan Hockey M.D on 11/25/2017 at 3:03 PM   Go to [email or web address] - password [email or web address] for contact info  [facility] - Office  [phone]

## 2017-11-25 NOTE — Evaluation (Signed)
Speech Language Pathology Evaluation Patient Details Name: Kaina Orengo MRN: [id number] DOB: April 02, 1951 Today's Date: 11/25/2017 Time: 6226-3335 SLP Time Calculation (min) (ACUTE ONLY): 22 min  Problem List:  Patient Active Problem List   Diagnosis Date Noted  . Thyroid goiter 11/25/2017  . UTI (urinary tract infection) 11/25/2017  . Stroke ([place]) 11/24/2017  . Anxiety 11/24/2017  . Hyperlipemia   . Hypertension   . Deficiency anemia 04/24/2014   Past Medical History:  Past Medical History:  Diagnosis Date  . Hyperlipemia   . Hypertension    Past Surgical History:  Past Surgical History:  Procedure Laterality Date  . TOTAL KNEE ARTHROPLASTY  2007   right knee   HPI:  Sherelle Castelli is an 67 y.o. female  has a past medical history of Hyperlipemia and Hypertension. Pt presented with a c/c of room-spinning dizziness precipitated by bending over in conjunction with new RUE and RLE weakness and gait imbalance. She also reported blurring in her right eye. MRI revealed sub acute right cerebellar and left thalamus subcentimeter nonhemorrhagic infarcts   Assessment / Plan / Recommendation Clinical Impression  Pt presents with clear, fluent expression, appropriate thought content and insight, good working memory and high level attention.  BE-FAST acronym reviewed with pt demonstrating understanding. No SLP needs are identified - our service will sign off.     SLP Assessment  SLP Recommendation/Assessment: Patient does not need any further Speech Lanaguage Pathology Services SLP Visit Diagnosis: Cognitive communication deficit (R41.841)    Follow Up Recommendations  None    Frequency and Duration           SLP Evaluation Cognition  Overall Cognitive Status: Within Functional Limits for tasks assessed Arousal/Alertness: Awake/alert Orientation Level: Oriented X4 Attention: Alternating Alternating Attention: Appears intact Memory: Appears intact Awareness: Appears  intact Problem Solving: Appears intact       Comprehension  Auditory Comprehension Overall Auditory Comprehension: Appears within functional limits for tasks assessed Visual Recognition/Discrimination Discrimination: Within Function Limits Reading Comprehension Reading Status: Within funtional limits    Expression Expression Primary Mode of Expression: Verbal Verbal Expression Overall Verbal Expression: Appears within functional limits for tasks assessed Written Expression Dominant Hand: Right Written Expression: Not tested   Oral / Motor  Oral Motor/Sensory Function Overall Oral Motor/Sensory Function: Mild impairment Facial Symmetry: Abnormal symmetry right;Suspected CN VII (facial) dysfunction(mild) Motor Speech Overall Motor Speech: Appears within functional limits for tasks assessed   GO                    Juan Quam Laurice 11/25/2017, 11:26 AM

## 2017-11-25 NOTE — Evaluation (Signed)
Physical Therapy Evaluation Patient Details Name: Susan Nichols MRN: [id number] DOB: 11-15-1950 Today's Date: 11/25/2017   History of Present Illness  Susan Nichols is an 67 y.o. female  has a past medical history of Hyperlipemia and Hypertension. Pt presented with a c/c of room-spinning dizziness precipitated by bending over in conjunction with new RUE and RLE weakness and gait imbalance. She also reported blurring in her right eye. MRI revealed sub acute right cerebellar and left thalamus subcentimeter nonhemorrhagic infarcts  Clinical Impression  Pt was assessed for mobility and will progress her to greater distances and balance challenges as able, but is very motivated to work and should be able to do HHPT.  Her husband will be able to assist her per pt, has equipment but will need to see if it is in good repair.  Follow acutely as pt is able to walk and strengthen LE's.      Follow Up Recommendations Home health PT;Supervision for mobility/OOB    Equipment Recommendations  Rolling walker with 5" wheels(if hers is not in good repair)    Recommendations for Other Services       Precautions / Restrictions Precautions Precautions: Fall(telemetry) Restrictions Weight Bearing Restrictions: No      Mobility  Bed Mobility Overal bed mobility: Modified Independent                Transfers Overall transfer level: Needs assistance Equipment used: Rolling walker (2 wheeled);1 person hand held assist Transfers: Sit to/from Stand Sit to Stand: Min assist         General transfer comment: pt was cued for hand placement   Ambulation/Gait Ambulation/Gait assistance: Min guard Gait Distance (Feet): 30 Feet Assistive device: Rolling walker (2 wheeled);1 person hand held assist Gait Pattern/deviations: Step-through pattern;Step-to pattern;Decreased stride length;Wide base of support;Trunk flexed Gait velocity: reduced Gait velocity interpretation: <1.31 ft/sec, indicative  of household ambulator General Gait Details: pt walks in flexed posture wiht slow pace and difficulty turning in confined spaces  Stairs            Wheelchair Mobility    Modified Rankin (Stroke Patients Only)       Balance                                             Pertinent Vitals/Pain Pain Assessment: No/denies pain    Home Living Family/patient expects to be discharged to:: Private residence Living Arrangements: Spouse/significant other Available Help at Discharge: Family;Available 24 hours/day Type of Home: House Home Access: Stairs to enter Entrance Stairs-Rails: None Entrance Stairs-Number of Steps: 1 Home Layout: One level Home Equipment: Walker - 2 wheels;Cane - single point      Prior Function Level of Independence: Independent         Comments: has not needed AD     Hand Dominance   Dominant Hand: Right    Extremity/Trunk Assessment   Upper Extremity Assessment Upper Extremity Assessment: Overall WFL for tasks assessed    Lower Extremity Assessment Lower Extremity Assessment: Generalized weakness    Cervical / Trunk Assessment Cervical / Trunk Assessment: Normal  Communication   Communication: No difficulties  Cognition Arousal/Alertness: Awake/alert Behavior During Therapy: WFL for tasks assessed/performed Overall Cognitive Status: Within Functional Limits for tasks assessed  General Comments      Exercises     Assessment/Plan    PT Assessment Patient needs continued PT services  PT Problem List Decreased strength;Decreased range of motion;Decreased activity tolerance;Decreased balance;Decreased mobility;Decreased coordination;Decreased knowledge of use of DME;Decreased safety awareness;Obesity       PT Treatment Interventions DME instruction;Gait training;Stair training;Functional mobility training;Therapeutic activities;Therapeutic exercise;Balance  training;Neuromuscular re-education;Patient/family education    PT Goals (Current goals can be found in the Care Plan section)  Acute Rehab PT Goals Patient Stated Goal: to feel better and get home PT Goal Formulation: With patient Time For Goal Achievement: 12/09/17 Potential to Achieve Goals: Good    Frequency Min 3X/week   Barriers to discharge   has family assistance for supervised gait    Co-evaluation               AM-PAC PT "6 Clicks" Daily Activity  Outcome Measure Difficulty turning over in bed (including adjusting bedclothes, sheets and blankets)?: A Little Difficulty moving from lying on back to sitting on the side of the bed? : Unable Difficulty sitting down on and standing up from a chair with arms (e.g., wheelchair, bedside commode, etc,.)?: Unable Help needed moving to and from a bed to chair (including a wheelchair)?: A Little Help needed walking in hospital room?: A Little Help needed climbing 3-5 steps with a railing? : A Lot 6 Click Score: 13    End of Session Equipment Utilized During Treatment: Gait belt Activity Tolerance: Patient limited by fatigue Patient left: in chair;with call bell/phone within reach;with chair alarm set Nurse Communication: Mobility status PT Visit Diagnosis: Unsteadiness on feet (R26.81);Other abnormalities of gait and mobility (R26.89);Repeated falls (R29.6);Muscle weakness (generalized) (M62.81);History of falling (Z91.81);Ataxic gait (R26.0);Difficulty in walking, not elsewhere classified (R26.2);Apraxia (R48.2)    Time: 2841-3244 PT Time Calculation (min) (ACUTE ONLY): 22 min   Charges:   PT Evaluation $PT Eval Moderate Complexity: 1 Mod PT Treatments $Gait Training: 8-22 mins   PT G Codes:   PT G-Codes **NOT FOR INPATIENT CLASS** Functional Assessment Tool Used: AM-PAC 6 Clicks Basic Mobility    Ramond Dial 11/25/2017, 10:37 AM   Mee Hives, PT MS Acute Rehab Dept. Number: [place]

## 2017-11-25 NOTE — Care Management Note (Addendum)
Case Management Note  Patient Details  Name: Bethanny Toelle MRN: [id number] Date of Birth: 1950-09-19  Subjective/Objective:                 Spoke w patient and family at bedside. She would like to have RW, order and referral placed, will be delivered to room prior to DC. She stated she would like to have [facility] for [facility] services. Referral placed, still need HH orders.    Action/Plan:  Needs HH PT order. RW requested 7/19. 7/20 Notified Brad w [facility] that patient will DC today w HH PT OT.  Expected Discharge Date:                  Expected Discharge Plan:  [place]  In-House Referral:     Discharge planning Services  CM Consult  Post Acute Care Choice:  Home Health, Durable Medical Equipment Choice offered to:  Patient  DME Arranged:  Walker rolling DME Agency:  [place]:  PT [place]:  [place]  Status of Service:  Completed, signed off  If discussed at [place] of Stay Meetings, dates discussed:    Additional Comments:  Carles Collet, RN 11/25/2017, 3:58 PM

## 2017-11-25 NOTE — Progress Notes (Signed)
STROKE TEAM PROGRESS NOTE   SUBJECTIVE (INTERVAL HISTORY) Her daughter is at the bedside.  Pt felt back to baseline except a little wobbly on walking with PT/OT today. Lying in bed, no acute distress.   OBJECTIVE Temp:  [97.6 F (36.4 C)-99.1 F (37.3 C)] 97.7 F (36.5 C) (07/19 1204) Pulse Rate:  [72-105] 85 (07/19 1204) Cardiac Rhythm: Normal sinus rhythm (07/19 0752) Resp:  [17-20] 18 (07/19 1204) BP: (152-177)/(58-93) 169/68 (07/19 1204) SpO2:  [96 %-100 %] 99 % (07/19 1204) Weight:  [221 lb 1.9 oz (100.3 kg)-223 lb (101.2 kg)] 221 lb 1.9 oz (100.3 kg) (07/19 0131)  CBC:  Recent Labs  Lab 11/24/17 1856  WBC 5.9  HGB 11.3*  HCT 35.0*  MCV 86.0  PLT 606    Basic Metabolic Panel:  Recent Labs  Lab 11/24/17 1856  NA 144  K 4.3  CL 110  CO2 26  GLUCOSE 104*  BUN 20  CREATININE 1.09*  CALCIUM 10.2    Lipid Panel:     Component Value Date/Time   CHOL  03/09/2009 0700    152        ATP III CLASSIFICATION:  <200     mg/dL   Desirable  200-239  mg/dL   Borderline High  >=240    mg/dL   High          TRIG 73 03/09/2009 0700   HDL 46 03/09/2009 0700   CHOLHDL 3.3 03/09/2009 0700   VLDL 15 03/09/2009 0700   LDLCALC  03/09/2009 0700    91        Total Cholesterol/HDL:CHD Risk Coronary Heart Disease Risk Table                     Men   Women  1/2 Average Risk   3.4   3.3  Average Risk       5.0   4.4  2 X Average Risk   9.6   7.1  3 X Average Risk  23.4   11.0        Use the calculated Patient Ratio above and the CHD Risk Table to determine the patient's CHD Risk.        ATP III CLASSIFICATION (LDL):  <100     mg/dL   Optimal  100-129  mg/dL   Near or Above                    Optimal  130-159  mg/dL   Borderline  160-189  mg/dL   High  >190     mg/dL   Very High   HgbA1c: No results found for: HGBA1C Urine Drug Screen:     Component Value Date/Time   LABOPIA POSITIVE (A) 11/25/2017 0520   COCAINSCRNUR NONE DETECTED 11/25/2017 0520   LABBENZ  NONE DETECTED 11/25/2017 0520   AMPHETMU NONE DETECTED 11/25/2017 0520   THCU NONE DETECTED 11/25/2017 0520   LABBARB (A) 11/25/2017 0520    Result not available. Reagent lot number recalled by manufacturer.    Alcohol Level No results found for: [facility]  IMAGING I have personally reviewed the radiological images below and agree with the radiology interpretations.  Ct Angio Head W Or Wo Contrast Ct Angio Neck W And/or Wo Contrast 11/24/2017 IMPRESSION:  1. Small infarct in the left thalamus stable from prior MRI. Very small infarcts in the right cerebellum are poorly visualized on CT. No new acute intracranial abnormality.  2. Patent carotid and vertebral  arteries. No dissection, aneurysm, or hemodynamically significant stenosis utilizing NASCET criteria.  3. Patent anterior and posterior intracranial circulation. No large vessel occlusion, aneurysm, or significant stenosis.  4. Mild calcific atherosclerosis of the aortic arch, carotid bifurcations, and carotid siphons.  5. Multinodular thyroid goiter. Thyroid ultrasound is recommended on a nonemergent basis.   Mr Brain Wo Contrast 11/24/2017 IMPRESSION:  1. Acute bilateral cerebella and LEFT thalamus subcentimeter nonhemorrhagic infarcts. Findings may be related to vertebrobasilar insufficiency or, small vessel ischemic disease. Consider CTA HEAD and neck for further evaluation.  2. Otherwise negative noncontrast MRI head for age.   US Thyroid 11/25/2017 IMPRESSION:  Thyroid goiter with diffusely enlarged left lobe. Nodules in the right lobe are of low suspicion based on TI-RADS criteria and do not meet criteria for biopsy or further dedicated follow-up. The above is in keeping with the ACR TI-RADS recommendations - J Am Coll Radiol 2017;14:587-595.   Transthoracic Echocardiogram  11/25/2017 Study Conclusions - Left ventricle: The cavity size was normal. Systolic function was   normal. The estimated ejection fraction was in the range of  60%   to 65%. Wall motion was normal; there were no regional wall   motion abnormalities. Doppler parameters are consistent with   abnormal left ventricular relaxation (grade 1 diastolic   dysfunction). Doppler parameters are consistent with elevated   ventricular end-diastolic filling pressure. - Aortic valve: Valve area (VTI): 2.06 cm^2. Valve area (Vmax): 1.9   cm^2. Valve area (Vmean): 2.06 cm^2. - Left atrium: The atrium was moderately dilated. - Right ventricle: Systolic function was normal. - Right atrium: The atrium was normal in size. - Pulmonic valve: There was no regurgitation. - Pericardium, extracardiac: The pericardium was normal in   appearance. Impressions: - No cardiac source of emboli was indentified.     PHYSICAL EXAM  Temp:  [97.6 F (36.4 C)-99.1 F (37.3 C)] 97.7 F (36.5 C) (07/19 1204) Pulse Rate:  [72-105] 96 (07/19 1656) Resp:  [17-20] 20 (07/19 1656) BP: (152-177)/(58-93) 177/63 (07/19 1656) SpO2:  [96 %-100 %] 97 % (07/19 1656) Weight:  [221 lb 1.9 oz (100.3 kg)-223 lb (101.2 kg)] 221 lb 1.9 oz (100.3 kg) (07/19 0131)  General - Well nourished, well developed, in no apparent distress.  Ophthalmologic - fundi not visualized due to noncooperation.  Cardiovascular - Regular rate and rhythm with no murmur.  Mental Status -  Level of arousal and orientation to time, place, and person were intact Language including expression, naming, repetition, comprehension was assessed and found intact. Fund of Knowledge was assessed and was intact.  Cranial Nerves II - XII - II - Visual field intact OU. III, IV, VI - Extraocular movements intact. V - Facial sensation intact bilaterally. VII - Facial movement intact bilaterally. VIII - Hearing & vestibular intact bilaterally. X - Palate elevates symmetrically. XI - Chin turning & shoulder shrug intact bilaterally. XII - Tongue protrusion intact.  Motor Strength - The patient's strength was normal in all  extremities and pronator drift was absent.  Bulk was normal and fasciculations were absent.   Motor Tone - Muscle tone was assessed at the neck and appendages and was normal.  Reflexes - The patient's reflexes were symmetrical in all extremities and she had no pathological reflexes.  Sensory - Light touch, temperature/pinprick were assessed and were symmetrical.    Coordination - The patient had normal movements in the hands and feet with no ataxia or dysmetria.  Tremor was absent.  Gait and Station - deferred.  ASSESSMENT/PLAN Ms. Roann Merk is a 67 y.o. female with history of hypertension, previous strokes by imaging and hyperlipidemia presenting with right-sided weakness, visual disturbance, and gait instability. She did not receive IV t-PA due to late presentation.  Stroke: Small infarcts in the left thalamus and the right cerebellum - likely secondary to small vessel disease.  However, due to separate vessel territory, will do 30-day cardiac event monitoring to rule out A. fib  Resultant back to baseline  CT head - Small infarct in the left thalamus. Very small infarcts in the right cerebellum  MRI head - Acute bilateral cerebella and LEFT thalamus subcentimeter nonhemorrhagic infarcts.  CTA H&N - unremarkable  2D Echo -EF 60 to 65%.  No cardiac source of emboli identified.  Recommend 30-day cardiac event monitoring to rule out A. fib as outpatient  LDL - 58  HgbA1c - 5.9  VTE prophylaxis -  Lovenox  No antithrombotic prior to admission, now on aspirin 81 mg daily and clopidogrel 75 mg daily.  Continue DAPT for 3 weeks and then aspirin alone.  Patient counseled to be compliant with her antithrombotic medications  Ongoing aggressive stroke risk factor management  Therapy recommendations:  pending  Disposition:  Pending  Hypertension  Stable . Permissive hypertension (OK if < 220/120) but gradually normalize in 5-7 days . Long-term BP goal  normotensive  Hyperlipidemia  Lipid lowering medication PTA: Lipitor 40 mg daily  LDL 58, goal < 70  Current lipid lowering medication: Lipitor 80 mg daily  Continue statin at discharge  Other Stroke Risk Factors  Advanced age  Obesity, Body mass index is 37.96 kg/m., recommend weight loss, diet and exercise as appropriate   Hx stroke/TIA by imaging  Family hx stroke (mother)  Other Active Problems  Thyroid goiter with diffusely enlarged left lobe - evaluated by [place] - low suspicion - bx not indicated  UTI - IV Rocephin  Hospital day # 1  Neurology will sign off. Please call with questions. Pt will follow up with stroke clinic NP at [facility] in about 4 weeks. Thanks for the consult.  Rosalin Hawking, MD PhD Stroke Neurology 11/25/2017 5:29 PM   To contact Stroke Continuity provider, please refer to [email or web address] After hours, contact General Neurology

## 2017-11-26 DIAGNOSIS — I6330 Cerebral infarction due to thrombosis of unspecified cerebral artery: Secondary | ICD-10-CM

## 2017-11-26 LAB — T3, FREE: T3 FREE: 3.4 pg/mL (ref 2.0–4.4)

## 2017-11-26 MED ORDER — ISOSORBIDE MONONITRATE ER 60 MG PO TB24: 60.0000 mg | ORAL_TABLET | Freq: Every day | ORAL | 5 refills | Status: AC

## 2017-11-26 MED ORDER — CHLORTHALIDONE 25 MG PO TABS: 25.0000 mg | ORAL_TABLET | Freq: Every day | ORAL | 5 refills | Status: AC

## 2017-11-26 MED ORDER — LISINOPRIL 20 MG PO TABS: 20.0000 mg | ORAL_TABLET | Freq: Every day | ORAL | 6 refills | Status: DC

## 2017-11-26 MED ORDER — HYDRALAZINE HCL 50 MG PO TABS: 50.0000 mg | ORAL_TABLET | Freq: Two times a day (BID) | ORAL | 5 refills | Status: AC

## 2017-11-26 MED ORDER — HYDROCODONE-ACETAMINOPHEN 5-325 MG PO TABS: 1.0000 | ORAL_TABLET | Freq: Every evening | ORAL | 0 refills | Status: DC | PRN

## 2017-11-26 MED ORDER — POTASSIUM CHLORIDE ER 10 MEQ PO CPCR
10.0000 meq | ORAL_CAPSULE | Freq: Every day | ORAL | 2 refills | Status: DC
Start: 2017-11-26 — End: 2021-10-05

## 2017-11-26 MED ORDER — CLONIDINE HCL 0.2 MG PO TABS: 0.2000 mg | ORAL_TABLET | Freq: Two times a day (BID) | ORAL | 11 refills | Status: DC

## 2017-11-26 MED ORDER — ASPIRIN EC 81 MG PO TBEC: 81.0000 mg | DELAYED_RELEASE_TABLET | Freq: Every day | ORAL | 12 refills | Status: DC

## 2017-11-26 MED ORDER — AMLODIPINE BESYLATE 10 MG PO TABS: 10.0000 mg | ORAL_TABLET | Freq: Every day | ORAL | 3 refills | Status: AC

## 2017-11-26 MED ORDER — ATORVASTATIN CALCIUM 40 MG PO TABS: 40.0000 mg | ORAL_TABLET | Freq: Every day | ORAL | 3 refills | Status: DC

## 2017-11-26 MED ORDER — CLOPIDOGREL BISULFATE 75 MG PO TABS: 75.0000 mg | ORAL_TABLET | Freq: Every day | ORAL | 0 refills | Status: DC

## 2017-11-26 MED ORDER — CEPHALEXIN 500 MG PO CAPS: 500.0000 mg | ORAL_CAPSULE | Freq: Three times a day (TID) | ORAL | 0 refills | Status: AC

## 2017-11-26 MED ORDER — ACETAMINOPHEN 325 MG PO TABS: 650.0000 mg | ORAL_TABLET | Freq: Four times a day (QID) | ORAL | 0 refills | Status: DC | PRN

## 2017-11-26 MED ORDER — ALPRAZOLAM 0.25 MG PO TABS: 0.2500 mg | ORAL_TABLET | Freq: Every day | ORAL | 3 refills | Status: DC | PRN

## 2017-11-26 MED ORDER — METOPROLOL TARTRATE 50 MG PO TABS: 50.0000 mg | ORAL_TABLET | Freq: Two times a day (BID) | ORAL | 3 refills | Status: AC

## 2017-11-26 NOTE — Discharge Instructions (Signed)
1) Gait and balance concerns due to acute stroke--- physical and occupational therapist to come to your house to help you get your strength and balance back 2)Take  aspirin and Plavix daily for 3 weeks , then after 3 weeks stop the Plavix and take only aspirin 81 mg daily with food indefinitely 3) you take aspirin and Plavix which are blood thinners so, Avoid ibuprofen/Advil/Aleve/Motrin/Goody Powders/Naproxen/BC powders/Meloxicam/Diclofenac/Indomethacin and other Nonsteroidal anti-inflammatory medications as these will make you more likely to bleed and can cause stomach ulcers, can also cause Kidney problems.  4)You need to have a 30-day Holter/event monitor device to help pick up on any irregular heartbeat like atrial fibrillation which you may have and which may put you at higher risk for strokes--your regular doctor/primary care provider and/or your cardiologist can arrange for this 30-day Holter/event monitor device to be picked on new  5) low-salt diet advised, keep a diary of your blood pressure, 6) follow-up with neurologist as advised in the clinic 7) take all your other medications including Lipitor/atorvastatin and blood pressure medications in order to reduce your risk for another stroke in the future

## 2017-11-26 NOTE — Discharge Summary (Signed)
Susan Nichols, is a 67 y.o. female  DOB July 16, 1950  MRN [id number].  Admission date:  11/24/2017  Admitting Physician  Ivor Costa, MD  Discharge Date:  11/26/2017   Primary MD  Lucianne Lei, MD  Recommendations for primary care physician for things to follow:  1) Gait and balance concerns due to acute stroke--- physical and occupational therapist to come to your house to help you get your strength and balance back 2)Take  aspirin and Plavix daily for 3 weeks , then after 3 weeks stop the Plavix and take only aspirin 81 mg daily with food indefinitely 3) you take aspirin and Plavix which are blood thinners so, Avoid ibuprofen/Advil/Aleve/Motrin/Goody Powders/Naproxen/BC powders/Meloxicam/Diclofenac/Indomethacin and other Nonsteroidal anti-inflammatory medications as these will make you more likely to bleed and can cause stomach ulcers, can also cause Kidney problems.  4)You need to have a 30-day Holter/event monitor device to help pick up on any irregular heartbeat like atrial fibrillation which you may have and which may put you at higher risk for strokes--your regular doctor/primary care provider and/or your cardiologist can arrange for this 30-day Holter/event monitor device to be picked on new  5) low-salt diet advised, keep a diary of your blood pressure, 6) follow-up with neurologist as advised in the clinic 7) take all your other medications including Lipitor/atorvastatin and blood pressure medications in order to reduce your risk for another stroke in the future    Admission Diagnosis  Lower urinary tract infectious disease [N39.0] Essential hypertension [I10] Cerebrovascular accident (CVA) due to other mechanism ([place]) [I63.89] Hyperlipidemia, unspecified hyperlipidemia type [E78.5]   Discharge Diagnosis  Lower urinary tract infectious disease [N39.0] Essential hypertension [I10] Cerebrovascular  accident (CVA) due to other mechanism ([place]) [I63.89] Hyperlipidemia, unspecified hyperlipidemia type [E78.5]    Principal Problem:   Stroke [facility]) Active Problems:   Hyperlipemia   Hypertension   Anxiety   Thyroid goiter   UTI (urinary tract infection)      Past Medical History:  Diagnosis Date  . Hyperlipemia   . Hypertension     Past Surgical History:  Procedure Laterality Date  . TOTAL KNEE ARTHROPLASTY  2007   right knee     HPI  from the history and physical done on the day of admission:    PCP: Lucianne Lei, MD   Patient coming from:  The patient is coming from home.  At baseline, pt is independent for most of ADL.    Chief Complaint: Dizziness, right-sided weakness, right eye blurry vision  HPI: Susan Nichols is a 67 y.o. female with medical history significant of hypertension, hyperlipidemia, anxiety, who presents with dizziness,  right-sided weakness, right eye blurry vision.  Patient states that she has been doing well ontil 11:00 AM when she started having dizziness, right-sided weakness and right eye blurry vision.  She feels room spinning around her. Her right arm and leg are week, which has gradually improved.  No slurred speech, facial droop, or hearing loss.  Patient has chills, but no fever.  Patient does not  have chest pain, shortness breath, cough.  no nausea, vomiting, diarrhea, abdominal pain.  She has increased urinary frequency, but no dysuria or burning on urination.  ED Course: pt was found to have WBC 5.9, urinalysis with positive nitrite, WBC 6-10, many bacteria and clear appearance.  Creatinine 1.09, temperature 99.1, no tachycardia, no tachypnea.  Patient is admitted to telemetry bed as inpatient. MRI brain showed acute stroke. CTA of head and neck showed no LVO, and showed multinodular thyroid goiter. Dr. Cheral Marker of neurology was consulted.    Hospital Course:     Brief summary 67 y.o.femalewith past medical history relevant for  hypertension and dyslipidemia admitted on 11/24/2017 with acute left thalamic lacunar infarction and bilateral subcentimeter cerebellar ischemic infarctions.    Plan:- 1)Acute Stroke/Acute left thalamic lacunar infarction and bilateral subcentimeter cerebellar ischemic infarctions----Neurology consult appreciated, PT consult appreciated, treat empirically with aspirin and Plavix for 3 weeks and then switch to aspirin alone after that per neurology recommendation, (patient was not on antiplatelet agent prior to admission ), echocardiogram without intracardiac thrombus, patient will need 30-day Holter monitor as outpatient to rule out possible A. Fib, fasting lipid profile with  LDL 58 and HDL of 35 and A1c 5.9 ,  continue Lipitor , Even if his lipid panel is within desired limits, patient should still take Lipitor/Statin for it's Pleiotropic effects (beyond cholesterol lowering benefits),    2)Gait disturbance/unsteady gait/right-sided weakness--PT recommends home health therapy  3) possible UTI--- treated with IV Rocephin , discharged on Keflex  4)HTN-low-salt diet and compliance with BP meds strongly encouraged  5) thyroid nodule--ultrasound of the thyroid noted, TSH is normal,  Discharge Condition: Outpatient follow-up with neurology as advised, follow-up with PCP or cardiologist to have 30-day holter/event monitor placed  Follow UP  [facility] up.   Why:  RW to be delivered to room prior to DC Contact information: [address] [place] [address] [phone]        [facility] Follow up.   Specialty:  Home Health Services Why:  For home health. They will call in the next 1-2 days to set up your first home visit.  Contact information: [place] [address] [facility]. Schedule an appointment as soon as possible for a visit  in 4 week(s).   Specialty:  Radiology Contact information: [place] [phone]          Diet and Activity recommendation:  As advised  Discharge Instructions    Discharge Instructions    ([place]) Call MD:  Anytime you have any of the following symptoms: 1) 3 pound weight gain in 24 hours or 5 pounds in 1 week 2) shortness of breath, with or without a dry hacking cough 3) swelling in the hands, feet or stomach 4) if you have to sleep on extra pillows at night in order to breathe.   Complete by:  As directed    Ambulatory referral to Neurology   Complete by:  As directed    Follow up with stroke clinic NP (Jessica Vanschaick or Cecille Rubin, if both not available, consider Zachery Dauer, or [place]) at [facility] in about 4 weeks. Thanks.   Call MD for:  difficulty breathing, headache or visual disturbances   Complete by:  As directed    Call MD for:  persistant dizziness or light-headedness   Complete by:  As directed    Call MD for:  persistant nausea and vomiting   Complete by:  As directed    Call MD for:  severe uncontrolled pain   Complete by:  As directed    Call MD for:  temperature >100.4   Complete by:  As directed    Diet - low sodium heart healthy   Complete by:  As directed    Discharge instructions   Complete by:  As directed    1) Gait and balance concerns due to acute stroke--- physical and occupational therapist to come to your house to help you get your strength and balance back 2)Take  aspirin and Plavix daily for 3 weeks , then after 3 weeks stop the Plavix and take only aspirin 81 mg daily with food indefinitely 3) you take aspirin and Plavix which are blood thinners so, Avoid ibuprofen/Advil/Aleve/Motrin/Goody Powders/Naproxen/BC powders/Meloxicam/Diclofenac/Indomethacin and other Nonsteroidal anti-inflammatory medications as these will make you more likely to bleed and can cause stomach ulcers,  can also cause Kidney problems.  4)You need to have a 30-day Holter/event monitor device to help pick up on any irregular heartbeat like atrial fibrillation which you may have and which may put you at higher risk for strokes--your regular doctor/primary care provider and/or your cardiologist can arrange for this 30-day Holter/event monitor device to be picked on new  5) low-salt diet advised, keep a diary of your blood pressure, 6) follow-up with neurologist as advised in the clinic 7) take all your other medications including Lipitor/atorvastatin and blood pressure medications in order to reduce your risk for another stroke in the future   Increase activity slowly   Complete by:  As directed         Discharge Medications     Allergies as of 11/26/2017   No Known Allergies     Medication List    STOP taking these medications   atenolol 50 MG tablet Commonly known as:  TENORMIN     TAKE these medications   acetaminophen 325 MG tablet Commonly known as:  TYLENOL Take 2 tablets (650 mg total) by mouth every 6 (six) hours as needed for mild pain or fever.   ALPRAZolam 0.25 MG tablet Commonly known as:  XANAX Take 1 tablet (0.25 mg total) by mouth daily as needed for anxiety.   amLODipine 10 MG tablet Commonly known as:  NORVASC Take 1 tablet (10 mg total) by mouth daily.   aspirin EC 81 MG tablet Take 1 tablet (81 mg total) by mouth daily. With food   atorvastatin 40 MG tablet Commonly known as:  LIPITOR Take 1 tablet (40 mg total) by mouth daily.   cephALEXin 500 MG capsule Commonly known as:  KEFLEX Take 1 capsule (500 mg total) by mouth 3 (three) times daily for 3 days.   chlorthalidone 25 MG tablet Commonly known as:  HYGROTON Take 1 tablet (25 mg total) by mouth daily.   cloNIDine 0.2 MG tablet Commonly known as:  CATAPRES Take 1 tablet (0.2 mg total) by mouth 2 (two) times daily. What changed:  when to take this   clopidogrel 75 MG tablet Commonly known  as:  PLAVIX Take 1 tablet (75 mg total) by mouth daily. For 3 weeks only Start taking on:  11/27/2017   hydrALAZINE 50 MG tablet Commonly known as:  APRESOLINE Take 1 tablet (50 mg total) by mouth 2 (two) times daily. What changed:    medication  strength  how much to take   HYDROcodone-acetaminophen 5-325 MG tablet Commonly known as:  NORCO/VICODIN Take 1 tablet by mouth at bedtime as needed for moderate pain.   isosorbide mononitrate 60 MG 24 hr tablet Commonly known as:  IMDUR Take 1 tablet (60 mg total) by mouth daily. What changed:  how much to take   lisinopril 20 MG tablet Commonly known as:  PRINIVIL,ZESTRIL Take 1 tablet (20 mg total) by mouth daily.   metoprolol tartrate 50 MG tablet Commonly known as:  LOPRESSOR Take 1 tablet (50 mg total) by mouth 2 (two) times daily.   potassium chloride 10 MEQ CR capsule Commonly known as:  MICRO-K Take 1 capsule (10 mEq total) by mouth daily.            Durable Medical Equipment  (From admission, onward)        Start     Ordered   11/26/17 1042  For home use only DME Walker rolling  [facility])  Once    Question:  Patient needs a walker to treat with the following condition  Answer:  Stroke [facility])   11/26/17 1042   11/25/17 1552  For home use only DME Walker rolling  Once    Question:  Patient needs a walker to treat with the following condition  Answer:  Weakness   11/25/17 1552      Major procedures and Radiology Reports - PLEASE review detailed and final reports for all details, in brief -   Ct Angio Head W Or Wo Contrast  Result Date: 11/24/2017 CLINICAL DATA:  67 y/o F; dizziness and weakness. Evaluation of stroke. EXAM: CT ANGIOGRAPHY HEAD AND NECK TECHNIQUE: Multidetector CT imaging of the head and neck was performed using the standard protocol during bolus administration of intravenous contrast. Multiplanar CT image reconstructions and MIPs were obtained to evaluate the vascular anatomy. Carotid stenosis  measurements (when applicable) are obtained utilizing NASCET criteria, using the distal internal carotid diameter as the denominator. CONTRAST:  169mL ISOVUE-370 IOPAMIDOL (ISOVUE-370) INJECTION 76% COMPARISON:  11/24/2017 MRI of the head. FINDINGS: CT HEAD FINDINGS Brain: Small lucency within left lateral thalamus corresponding to infarct on MRI of the head. No new intracranial hemorrhage, stroke, or focal mass effect of the brain. Very small infarcts within the right cerebellum are poorly visualized on CT. No extra-axial collection, hydrocephalus, or herniation. Vascular: As below. Skull: Normal. Negative for fracture or focal lesion. Sinuses: Imaged portions are clear. Orbits: No acute finding. Review of the MIP images confirms the above findings CTA NECK FINDINGS Aortic arch: Standard branching. Imaged portion shows no evidence of aneurysm or dissection. No significant stenosis of the major arch vessel origins. Mild calcific atherosclerosis of the aortic arch. Right carotid system: No evidence of dissection, stenosis (50% or greater) or occlusion. Left carotid system: No evidence of dissection, stenosis (50% or greater) or occlusion. Mild non stenotic calcific atherosclerosis of the carotid bifurcation. Vertebral arteries: Codominant. No evidence of dissection, stenosis (50% or greater) or occlusion. Skeleton: Moderate cervical spondylosis with multilevel disc and facet degenerative changes. Other neck: Multinodular thyroid goiter with the left lobe of the thyroid measuring up to 7.5 cm and the right lobe measuring up to 6.1 cm. There is mass effect on the airway which is deviated to the right. The left lobe of the thyroid extends into the upper mediastinum. Upper chest: Negative. Review of the MIP images confirms the above findings CTA HEAD FINDINGS Anterior circulation: Calcific atherosclerosis of carotid siphons with mild bilateral  paraclinoid stenosis. Patent bilateral ACA and MCA. No large vessel occlusion,  aneurysm, or significant stenosis is identified. Posterior circulation: No significant stenosis, proximal occlusion, aneurysm, or vascular malformation. Venous sinuses: As permitted by contrast timing, patent. Anatomic variants: Large left A1, anterior communicating artery, and diminutive right A1, normal variant. No posterior communicating artery identified, likely hypoplastic or absent. Delayed phase: No abnormal intracranial enhancement. Review of the MIP images confirms the above findings IMPRESSION: 1. Small infarct in the left thalamus stable from prior MRI. Very small infarcts in the right cerebellum are poorly visualized on CT. No new acute intracranial abnormality. 2. Patent carotid and vertebral arteries. No dissection, aneurysm, or hemodynamically significant stenosis utilizing NASCET criteria. 3. Patent anterior and posterior intracranial circulation. No large vessel occlusion, aneurysm, or significant stenosis. 4. Mild calcific atherosclerosis of the aortic arch, carotid bifurcations, and carotid siphons. 5. Multinodular thyroid goiter. Thyroid ultrasound is recommended on a nonemergent basis. Electronically Signed   By: Kristine Garbe M.D.   On: 11/24/2017 23:30   Ct Angio Neck W And/or Wo Contrast  Result Date: 11/24/2017 CLINICAL DATA:  67 y/o F; dizziness and weakness. Evaluation of stroke. EXAM: CT ANGIOGRAPHY HEAD AND NECK TECHNIQUE: Multidetector CT imaging of the head and neck was performed using the standard protocol during bolus administration of intravenous contrast. Multiplanar CT image reconstructions and MIPs were obtained to evaluate the vascular anatomy. Carotid stenosis measurements (when applicable) are obtained utilizing NASCET criteria, using the distal internal carotid diameter as the denominator. CONTRAST:  188mL ISOVUE-370 IOPAMIDOL (ISOVUE-370) INJECTION 76% COMPARISON:  11/24/2017 MRI of the head. FINDINGS: CT HEAD FINDINGS Brain: Small lucency within left lateral  thalamus corresponding to infarct on MRI of the head. No new intracranial hemorrhage, stroke, or focal mass effect of the brain. Very small infarcts within the right cerebellum are poorly visualized on CT. No extra-axial collection, hydrocephalus, or herniation. Vascular: As below. Skull: Normal. Negative for fracture or focal lesion. Sinuses: Imaged portions are clear. Orbits: No acute finding. Review of the MIP images confirms the above findings CTA NECK FINDINGS Aortic arch: Standard branching. Imaged portion shows no evidence of aneurysm or dissection. No significant stenosis of the major arch vessel origins. Mild calcific atherosclerosis of the aortic arch. Right carotid system: No evidence of dissection, stenosis (50% or greater) or occlusion. Left carotid system: No evidence of dissection, stenosis (50% or greater) or occlusion. Mild non stenotic calcific atherosclerosis of the carotid bifurcation. Vertebral arteries: Codominant. No evidence of dissection, stenosis (50% or greater) or occlusion. Skeleton: Moderate cervical spondylosis with multilevel disc and facet degenerative changes. Other neck: Multinodular thyroid goiter with the left lobe of the thyroid measuring up to 7.5 cm and the right lobe measuring up to 6.1 cm. There is mass effect on the airway which is deviated to the right. The left lobe of the thyroid extends into the upper mediastinum. Upper chest: Negative. Review of the MIP images confirms the above findings CTA HEAD FINDINGS Anterior circulation: Calcific atherosclerosis of carotid siphons with mild bilateral paraclinoid stenosis. Patent bilateral ACA and MCA. No large vessel occlusion, aneurysm, or significant stenosis is identified. Posterior circulation: No significant stenosis, proximal occlusion, aneurysm, or vascular malformation. Venous sinuses: As permitted by contrast timing, patent. Anatomic variants: Large left A1, anterior communicating artery, and diminutive right A1, normal  variant. No posterior communicating artery identified, likely hypoplastic or absent. Delayed phase: No abnormal intracranial enhancement. Review of the MIP images confirms the above findings IMPRESSION: 1. Small infarct in the left  thalamus stable from prior MRI. Very small infarcts in the right cerebellum are poorly visualized on CT. No new acute intracranial abnormality. 2. Patent carotid and vertebral arteries. No dissection, aneurysm, or hemodynamically significant stenosis utilizing NASCET criteria. 3. Patent anterior and posterior intracranial circulation. No large vessel occlusion, aneurysm, or significant stenosis. 4. Mild calcific atherosclerosis of the aortic arch, carotid bifurcations, and carotid siphons. 5. Multinodular thyroid goiter. Thyroid ultrasound is recommended on a nonemergent basis. Electronically Signed   By: Kristine Garbe M.D.   On: 11/24/2017 23:30   Mr Brain Wo Contrast  Result Date: 11/24/2017 CLINICAL DATA:  Leg weakness, presyncope. Vertigo with bending over. RIGHT eye blurry vision. History of hypertension, hyperlipidemia. EXAM: MRI HEAD WITHOUT CONTRAST TECHNIQUE: Multiplanar, multiecho pulse sequences of the brain and surrounding structures were obtained without intravenous contrast. COMPARISON:  None. FINDINGS: INTRACRANIAL CONTENTS: 2 RIGHT M1 LEFT cerebellar foci of reduced diffusion with low ADC values. Subcentimeter LEFT thalamus reduced diffusion with low ADC values. No susceptibility artifact to suggest hemorrhage. No parenchymal brain volume loss for age. No midline shift, mass effect or masses. No significant white matter changes for age. No abnormal extra-axial fluid collections. VASCULAR: Normal major intracranial vascular flow voids present at skull base. SKULL AND UPPER CERVICAL SPINE: Borderline empty sella. No suspicious calvarial bone marrow signal. Craniocervical junction maintained. SINUSES/ORBITS: The mastoid air-cells and included paranasal sinuses  are well-aerated.The included ocular globes and orbital contents are non-suspicious. OTHER: Patient is edentulous. IMPRESSION: 1. Acute bilateral cerebella and LEFT thalamus subcentimeter nonhemorrhagic infarcts. Findings may be related to vertebrobasilar insufficiency or, small vessel ischemic disease. Consider CTA HEAD and neck for further evaluation. 2. Otherwise negative noncontrast MRI head for age. 3. Acute findings discussed with and reconfirmed by Dr.DAVID YAO on 11/24/2017 at 10:20 pm. Electronically Signed   By: Elon Alas M.D.   On: 11/24/2017 22:26   US Thyroid  Result Date: 11/25/2017 CLINICAL DATA:  Incidental on CT. Thyroid goiter noted by CT angiography of the neck. EXAM: THYROID ULTRASOUND TECHNIQUE: Ultrasound examination of the thyroid gland and adjacent soft tissues was performed. COMPARISON:  CTA of the neck on 11/24/2017 FINDINGS: Parenchymal Echotexture: Moderately heterogenous Isthmus: 1.4 cm Right lobe: 5.0 x 2.2 x 3.4 cm Left lobe: 7.8 x 5.0 x 7.1 cm _________________________________________________________ Estimated total number of nodules >/= 1 cm: 3 Number of spongiform nodules >/=  2 cm not described below (TR1): 0 Number of mixed cystic and solid nodules >/= 1.5 cm not described below (TR2): 0 _________________________________________________________ Nodule # 1: Location: Right; Mid Maximum size: 1.1 cm; Other 2 dimensions: 0.7 x 0.9 cm Composition: mixed cystic and solid (1) Echogenicity: isoechoic (1) Shape: not taller-than-wide (0) Margins: smooth (0) Echogenic foci: none (0) ACR TI-RADS total points: 2. ACR TI-RADS risk category: TR2 (2 points). ACR TI-RADS recommendations: This nodule does NOT meet TI-RADS criteria for biopsy or dedicated follow-up. _________________________________________________________ Nodule # 2: Location: Right; Superior Maximum size: 1.8 cm; Other 2 dimensions: 1.2 x 1.8 cm Composition: spongiform (0) Echogenicity: anechoic (0) Shape: not  taller-than-wide (0) Margins: smooth (0) Echogenic foci: none (0) ACR TI-RADS total points: 0. ACR TI-RADS risk category: TR1 (0-1 points). ACR TI-RADS recommendations: This nodule does NOT meet TI-RADS criteria for biopsy or dedicated follow-up. _________________________________________________________ Nodule # 3: Location: Right; Inferior Maximum size: 2.3 cm; Other 2 dimensions: 1.7 x 2.1 cm Composition: cystic/almost completely cystic (0) Echogenicity: isoechoic (1) Shape: not taller-than-wide (0) Margins: smooth (0) Echogenic foci: none (0) ACR TI-RADS total points: 1. ACR  TI-RADS risk category: TR1 (0-1 points). ACR TI-RADS recommendations: This nodule does NOT meet TI-RADS criteria for biopsy or dedicated follow-up. _________________________________________________________ The left lobe is diffusely enlarged and heterogeneous in appearance without discrete nodule. No enlarged lymph nodes identified. IMPRESSION: Thyroid goiter with diffusely enlarged left lobe. Nodules in the right lobe are of low suspicion based on TI-RADS criteria and do not meet criteria for biopsy or further dedicated follow-up. The above is in keeping with the ACR TI-RADS recommendations - J Am Coll Radiol 2017;14:587-595. Electronically Signed   By: Aletta Edouard M.D.   On: 11/25/2017 13:15    Micro Results     No results found for this or any previous visit (from the past 240 hour(s)).   Today   Subjective    Minda Ditto today has no new complaints, son and daughter at bedside, questions answered          Patient has been seen and examined prior to discharge   Objective   Blood pressure (!) 177/61, pulse 90, temperature 98.9 F (37.2 C), temperature source Oral, resp. rate 20, height 5\' 4"  (1.626 m), weight 100.3 kg (221 lb 1.9 oz), SpO2 99 %.   Intake/Output Summary (Last 24 hours) at 11/26/2017 1052 Last data filed at 11/26/2017 0930 Gross per 24 hour  Intake 829.2 ml  Output -  Net 829.2 ml     Exam Gen:- Awake Alert, in no acute distress HEENT:- [place].AT, No sclera icterus Neck-Supple Neck,No JVD,.  Lungs-  CTAB , good air movement CV- S1, S2 normal, regular Abd-  +ve B.Sounds, Abd Soft, No tenderness,    Extremity/Skin:- No  edema,   good pulses Psych-affect is appropriate, oriented x3 Neuro-unsteady gait and balance issues persist, right-sided weakness has improved    Data Review   CBC w Diff:  Lab Results  Component Value Date   WBC 5.9 11/24/2017   HGB 11.3 (L) 11/24/2017   HGB 11.1 (L) 09/04/2014   HCT 35.0 (L) 11/24/2017   HCT 33.7 (L) 09/04/2014   PLT 275 11/24/2017   PLT 208 09/04/2014   LYMPHOPCT 35.3 09/04/2014   MONOPCT 9.5 09/04/2014   EOSPCT 2.1 09/04/2014   BASOPCT 1.1 09/04/2014    CMP:  Lab Results  Component Value Date   NA 144 11/24/2017   NA 141 04/24/2014   K 4.3 11/24/2017   K 3.9 04/24/2014   CL 110 11/24/2017   CO2 26 11/24/2017   CO2 23 04/24/2014   BUN 20 11/24/2017   BUN 25.2 04/24/2014   CREATININE 1.09 (H) 11/24/2017   CREATININE 1.3 (H) 04/24/2014   PROT 7.4 04/24/2014   ALBUMIN 3.8 04/24/2014   BILITOT 0.49 04/24/2014   ALKPHOS 79 04/24/2014   AST 19 04/24/2014   ALT 13 04/24/2014  .   Total Discharge time is about 33 minutes  Roxan Hockey M.D on 11/26/2017 at 10:52 AM   Go to [email or web address] - [email or web address] for contact info  [facility] - Office  [phone]

## 2017-11-28 DIAGNOSIS — Z7902 Long term (current) use of antithrombotics/antiplatelets: Secondary | ICD-10-CM | POA: Diagnosis not present

## 2017-11-28 DIAGNOSIS — N390 Urinary tract infection, site not specified: Secondary | ICD-10-CM | POA: Diagnosis not present

## 2017-11-28 DIAGNOSIS — E785 Hyperlipidemia, unspecified: Secondary | ICD-10-CM | POA: Diagnosis not present

## 2017-11-28 DIAGNOSIS — F419 Anxiety disorder, unspecified: Secondary | ICD-10-CM | POA: Diagnosis not present

## 2017-11-28 DIAGNOSIS — Z8673 Personal history of transient ischemic attack (TIA), and cerebral infarction without residual deficits: Secondary | ICD-10-CM | POA: Diagnosis not present

## 2017-11-28 DIAGNOSIS — I10 Essential (primary) hypertension: Secondary | ICD-10-CM | POA: Diagnosis not present

## 2017-11-28 DIAGNOSIS — Z96651 Presence of right artificial knee joint: Secondary | ICD-10-CM | POA: Diagnosis not present

## 2017-11-29 DIAGNOSIS — I10 Essential (primary) hypertension: Secondary | ICD-10-CM | POA: Diagnosis not present

## 2017-11-29 DIAGNOSIS — I63412 Cerebral infarction due to embolism of left middle cerebral artery: Secondary | ICD-10-CM | POA: Diagnosis not present

## 2017-11-29 DIAGNOSIS — I2510 Atherosclerotic heart disease of native coronary artery without angina pectoris: Secondary | ICD-10-CM | POA: Diagnosis not present

## 2017-11-30 DIAGNOSIS — Z7902 Long term (current) use of antithrombotics/antiplatelets: Secondary | ICD-10-CM | POA: Diagnosis not present

## 2017-11-30 DIAGNOSIS — Z8673 Personal history of transient ischemic attack (TIA), and cerebral infarction without residual deficits: Secondary | ICD-10-CM | POA: Diagnosis not present

## 2017-11-30 DIAGNOSIS — E785 Hyperlipidemia, unspecified: Secondary | ICD-10-CM | POA: Diagnosis not present

## 2017-11-30 DIAGNOSIS — I10 Essential (primary) hypertension: Secondary | ICD-10-CM | POA: Diagnosis not present

## 2017-11-30 DIAGNOSIS — N390 Urinary tract infection, site not specified: Secondary | ICD-10-CM | POA: Diagnosis not present

## 2017-11-30 DIAGNOSIS — Z96651 Presence of right artificial knee joint: Secondary | ICD-10-CM | POA: Diagnosis not present

## 2017-11-30 DIAGNOSIS — F419 Anxiety disorder, unspecified: Secondary | ICD-10-CM | POA: Diagnosis not present

## 2017-12-01 DIAGNOSIS — Z7902 Long term (current) use of antithrombotics/antiplatelets: Secondary | ICD-10-CM | POA: Diagnosis not present

## 2017-12-01 DIAGNOSIS — F419 Anxiety disorder, unspecified: Secondary | ICD-10-CM | POA: Diagnosis not present

## 2017-12-01 DIAGNOSIS — Z96651 Presence of right artificial knee joint: Secondary | ICD-10-CM | POA: Diagnosis not present

## 2017-12-01 DIAGNOSIS — N390 Urinary tract infection, site not specified: Secondary | ICD-10-CM | POA: Diagnosis not present

## 2017-12-01 DIAGNOSIS — I10 Essential (primary) hypertension: Secondary | ICD-10-CM | POA: Diagnosis not present

## 2017-12-01 DIAGNOSIS — E785 Hyperlipidemia, unspecified: Secondary | ICD-10-CM | POA: Diagnosis not present

## 2017-12-01 DIAGNOSIS — Z8673 Personal history of transient ischemic attack (TIA), and cerebral infarction without residual deficits: Secondary | ICD-10-CM | POA: Diagnosis not present

## 2017-12-07 DIAGNOSIS — Z7902 Long term (current) use of antithrombotics/antiplatelets: Secondary | ICD-10-CM | POA: Diagnosis not present

## 2017-12-07 DIAGNOSIS — F419 Anxiety disorder, unspecified: Secondary | ICD-10-CM | POA: Diagnosis not present

## 2017-12-07 DIAGNOSIS — I10 Essential (primary) hypertension: Secondary | ICD-10-CM | POA: Diagnosis not present

## 2017-12-07 DIAGNOSIS — Z96651 Presence of right artificial knee joint: Secondary | ICD-10-CM | POA: Diagnosis not present

## 2017-12-07 DIAGNOSIS — N390 Urinary tract infection, site not specified: Secondary | ICD-10-CM | POA: Diagnosis not present

## 2017-12-07 DIAGNOSIS — E785 Hyperlipidemia, unspecified: Secondary | ICD-10-CM | POA: Diagnosis not present

## 2017-12-07 DIAGNOSIS — Z8673 Personal history of transient ischemic attack (TIA), and cerebral infarction without residual deficits: Secondary | ICD-10-CM | POA: Diagnosis not present

## 2017-12-08 DIAGNOSIS — Z8673 Personal history of transient ischemic attack (TIA), and cerebral infarction without residual deficits: Secondary | ICD-10-CM | POA: Diagnosis not present

## 2017-12-08 DIAGNOSIS — N390 Urinary tract infection, site not specified: Secondary | ICD-10-CM | POA: Diagnosis not present

## 2017-12-08 DIAGNOSIS — Z7902 Long term (current) use of antithrombotics/antiplatelets: Secondary | ICD-10-CM | POA: Diagnosis not present

## 2017-12-08 DIAGNOSIS — F419 Anxiety disorder, unspecified: Secondary | ICD-10-CM | POA: Diagnosis not present

## 2017-12-08 DIAGNOSIS — I10 Essential (primary) hypertension: Secondary | ICD-10-CM | POA: Diagnosis not present

## 2017-12-08 DIAGNOSIS — Z96651 Presence of right artificial knee joint: Secondary | ICD-10-CM | POA: Diagnosis not present

## 2017-12-08 DIAGNOSIS — E785 Hyperlipidemia, unspecified: Secondary | ICD-10-CM | POA: Diagnosis not present

## 2017-12-15 DIAGNOSIS — Z7902 Long term (current) use of antithrombotics/antiplatelets: Secondary | ICD-10-CM | POA: Diagnosis not present

## 2017-12-15 DIAGNOSIS — F419 Anxiety disorder, unspecified: Secondary | ICD-10-CM | POA: Diagnosis not present

## 2017-12-15 DIAGNOSIS — N390 Urinary tract infection, site not specified: Secondary | ICD-10-CM | POA: Diagnosis not present

## 2017-12-15 DIAGNOSIS — Z96651 Presence of right artificial knee joint: Secondary | ICD-10-CM | POA: Diagnosis not present

## 2017-12-15 DIAGNOSIS — E785 Hyperlipidemia, unspecified: Secondary | ICD-10-CM | POA: Diagnosis not present

## 2017-12-15 DIAGNOSIS — Z8673 Personal history of transient ischemic attack (TIA), and cerebral infarction without residual deficits: Secondary | ICD-10-CM | POA: Diagnosis not present

## 2017-12-15 DIAGNOSIS — I10 Essential (primary) hypertension: Secondary | ICD-10-CM | POA: Diagnosis not present

## 2017-12-16 DIAGNOSIS — N390 Urinary tract infection, site not specified: Secondary | ICD-10-CM | POA: Diagnosis not present

## 2017-12-16 DIAGNOSIS — E785 Hyperlipidemia, unspecified: Secondary | ICD-10-CM | POA: Diagnosis not present

## 2017-12-16 DIAGNOSIS — F419 Anxiety disorder, unspecified: Secondary | ICD-10-CM | POA: Diagnosis not present

## 2017-12-16 DIAGNOSIS — Z96651 Presence of right artificial knee joint: Secondary | ICD-10-CM | POA: Diagnosis not present

## 2017-12-16 DIAGNOSIS — I10 Essential (primary) hypertension: Secondary | ICD-10-CM | POA: Diagnosis not present

## 2017-12-16 DIAGNOSIS — Z7902 Long term (current) use of antithrombotics/antiplatelets: Secondary | ICD-10-CM | POA: Diagnosis not present

## 2017-12-16 DIAGNOSIS — Z8673 Personal history of transient ischemic attack (TIA), and cerebral infarction without residual deficits: Secondary | ICD-10-CM | POA: Diagnosis not present

## 2017-12-20 DIAGNOSIS — I10 Essential (primary) hypertension: Secondary | ICD-10-CM | POA: Diagnosis not present

## 2017-12-20 DIAGNOSIS — N390 Urinary tract infection, site not specified: Secondary | ICD-10-CM | POA: Diagnosis not present

## 2017-12-20 DIAGNOSIS — F419 Anxiety disorder, unspecified: Secondary | ICD-10-CM | POA: Diagnosis not present

## 2017-12-20 DIAGNOSIS — Z96651 Presence of right artificial knee joint: Secondary | ICD-10-CM | POA: Diagnosis not present

## 2017-12-20 DIAGNOSIS — E785 Hyperlipidemia, unspecified: Secondary | ICD-10-CM | POA: Diagnosis not present

## 2017-12-20 DIAGNOSIS — Z7902 Long term (current) use of antithrombotics/antiplatelets: Secondary | ICD-10-CM | POA: Diagnosis not present

## 2017-12-20 DIAGNOSIS — Z8673 Personal history of transient ischemic attack (TIA), and cerebral infarction without residual deficits: Secondary | ICD-10-CM | POA: Diagnosis not present

## 2017-12-22 DIAGNOSIS — Z96651 Presence of right artificial knee joint: Secondary | ICD-10-CM | POA: Diagnosis not present

## 2017-12-22 DIAGNOSIS — I10 Essential (primary) hypertension: Secondary | ICD-10-CM | POA: Diagnosis not present

## 2017-12-22 DIAGNOSIS — E785 Hyperlipidemia, unspecified: Secondary | ICD-10-CM | POA: Diagnosis not present

## 2017-12-22 DIAGNOSIS — Z7902 Long term (current) use of antithrombotics/antiplatelets: Secondary | ICD-10-CM | POA: Diagnosis not present

## 2017-12-22 DIAGNOSIS — F419 Anxiety disorder, unspecified: Secondary | ICD-10-CM | POA: Diagnosis not present

## 2017-12-22 DIAGNOSIS — Z8673 Personal history of transient ischemic attack (TIA), and cerebral infarction without residual deficits: Secondary | ICD-10-CM | POA: Diagnosis not present

## 2017-12-22 DIAGNOSIS — N390 Urinary tract infection, site not specified: Secondary | ICD-10-CM | POA: Diagnosis not present

## 2017-12-27 ENCOUNTER — Encounter: Payer: Self-pay | Admitting: Adult Health

## 2017-12-27 ENCOUNTER — Ambulatory Visit: Payer: Medicare HMO | Admitting: Adult Health

## 2017-12-27 VITALS — BP 149/69 | HR 76 | Ht 64.0 in | Wt 224.0 lb

## 2017-12-27 DIAGNOSIS — I63533 Cerebral infarction due to unspecified occlusion or stenosis of bilateral posterior cerebral arteries: Secondary | ICD-10-CM | POA: Diagnosis not present

## 2017-12-27 DIAGNOSIS — I10 Essential (primary) hypertension: Secondary | ICD-10-CM

## 2017-12-27 DIAGNOSIS — E785 Hyperlipidemia, unspecified: Secondary | ICD-10-CM

## 2017-12-27 NOTE — Progress Notes (Signed)
[facility] [address] [place]. [place] [address] [phone]       OFFICE FOLLOW UP NOTE  Susan Nichols Date of Birth:  16-Dec-1950 Medical Record Number:  [id number]   Reason for Referral:  hospital stroke follow up  CHIEF COMPLAINT:  Chief Complaint  Patient presents with  . Follow-up    Hospital Stroke follow up,  Pt saw Dr. 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 History obtained from patient, daughter and chart review. Reviewed all radiology images and labs personally.  [name] is a 67 y.o. female with history of hypertension, previous strokes by imaging and hyperlipidemia presenting with right-sided weakness, visual disturbance, and gait instability. She did not receive IV t-PA due to late presentation.  CT head reviewed and showed small infarct in the left thalamus along with very small infarcts in the right cerebellum.  MRI head reviewed and showed acute bilateral cerebellar and left thalamus subcentimeter nonhemorrhagic infarcts.  CTA head and neck were unremarkable.  2D echo showed an EF of 60 to 65% without cardiac source of emboli identified.  It was felt as though the strokes are likely secondary to small vessel disease however due to separate vessel territory recommended 30-day cardiac event monitor as outpatient to rule out atrial fibrillation.  LDL 58 and as patient was on Lipitor 40 mg daily previously recommended to increase to Lipitor 80 mg daily.  HTN stable during admission and recommended long-term BP goal normotensive.  A1c satisfactory at 5.9.  Patient was not on  antithrombotic prior to admission and recommended DAPT for 3 weeks on aspirin alone.  Patient was discharged in satisfactory condition.  Patient is being seen today for hospital follow-up and is accompanied by her daughter.  She states she has been doing well with only mild residual gait instability for which she is currently using a cane but was previously using rolling walker immediately after hospital discharge.  She continues home PT.  She also states vision has completely improved along with right hemiparesis.  She continues to take aspirin as she has completed 3-week DAPT and denies bleeding or bruising.  Continues to take Lipitor without side effects myalgias.  Blood pressure today elevated for patient at 149/69 and typically SBP 120s to 130s.  It was recommended during hospitalization for 30-day cardiac monitor but per patient, cardiologist did not believe that this was necessary and recommended to hold off on this monitoring at this time.  Denies new or worsening stroke/TIA symptoms.   ROS:   14 system review of systems performed and negative with exception of blurred vision and weakness  PMH:  Past Medical History:  Diagnosis Date  . Hyperlipemia   . Hypertension   . Stroke [facility])     PSH:  Past Surgical History:  Procedure Laterality Date  . TOTAL KNEE ARTHROPLASTY  2007   right knee    Social History:  Social History   Socioeconomic History  . Marital status: Single    Spouse name: Not on file  . Number of children: Not on file  . Years of education: Not on file  . Highest education level: Not on  file  Occupational History  . Not on file  Social Needs  . Financial resource strain: Not on file  . Food insecurity:    Worry: Not on file    Inability: Not on file  . Transportation needs:    Medical: Not on file    Non-medical: Not on file  Tobacco Use  . Smoking status: Never Smoker  . Smokeless tobacco: Never Used  Substance and Sexual Activity  . Alcohol use: No    . Drug use: No  . Sexual activity: Not on file  Lifestyle  . Physical activity:    Days per week: Not on file    Minutes per session: Not on file  . Stress: Not on file  Relationships  . Social connections:    Talks on phone: Not on file    Gets together: Not on file    Attends religious service: Not on file    Active member of club or organization: Not on file    Attends meetings of clubs or organizations: Not on file    Relationship status: Not on file  . Intimate partner violence:    Fear of current or ex partner: Not on file    Emotionally abused: Not on file    Physically abused: Not on file    Forced sexual activity: Not on file  Other Topics Concern  . Not on file  Social History Narrative  . Not on file    Family History:  Family History  Problem Relation Age of Onset  . Stroke Mother   . Hypertension Mother   . Hyperlipidemia Mother   . Hypertension Father   . Hyperlipidemia Father   . Stroke Brother     Medications:   Current Outpatient Medications on File Prior to Visit  Medication Sig Dispense Refill  . acetaminophen (TYLENOL) 325 MG tablet Take 2 tablets (650 mg total) by mouth every 6 (six) hours as needed for mild pain or fever. 12 tablet 0  . ALPRAZolam (XANAX) 0.25 MG tablet Take 1 tablet (0.25 mg total) by mouth daily as needed for anxiety. 12 tablet 3  . amLODipine (NORVASC) 10 MG tablet Take 1 tablet (10 mg total) by mouth daily. 30 tablet 3  . atorvastatin (LIPITOR) 40 MG tablet Take 1 tablet (40 mg total) by mouth daily. 30 tablet 3  . chlorthalidone (HYGROTON) 25 MG tablet Take 1 tablet (25 mg total) by mouth daily. 30 tablet 5  . cloNIDine (CATAPRES) 0.2 MG tablet Take 1 tablet (0.2 mg total) by mouth 2 (two) times daily. 60 tablet 11  . hydrALAZINE (APRESOLINE) 50 MG tablet Take 1 tablet (50 mg total) by mouth 2 (two) times daily. 60 tablet 5  . HYDROcodone-acetaminophen (NORCO/VICODIN) 5-325 MG tablet Take 1 tablet by mouth at bedtime as  needed for moderate pain. 15 tablet 0  . isosorbide mononitrate (IMDUR) 60 MG 24 hr tablet Take 1 tablet (60 mg total) by mouth daily. 30 tablet 5  . lisinopril (PRINIVIL,ZESTRIL) 20 MG tablet Take 1 tablet (20 mg total) by mouth daily. 30 tablet 6  . metoprolol tartrate (LOPRESSOR) 50 MG tablet Take 1 tablet (50 mg total) by mouth 2 (two) times daily. 60 tablet 3  . potassium chloride (MICRO-K) 10 MEQ CR capsule Take 1 capsule (10 mEq total) by mouth daily. 30 capsule 2   No current facility-administered medications on file prior to visit.     Allergies:  No Known Allergies   Physical Exam  Vitals:  12/27/17 1413  BP: (!) 149/69  Pulse: 76  Weight: 224 lb (101.6 kg)  Height: 5\' 4"  (1.626 m)   Body mass index is 38.45 kg/m. No exam data present  General: well developed, well nourished, pleasant elderly African-American female, seated, in no evident distress Head: head normocephalic and atraumatic.   Neck: supple with no carotid or supraclavicular bruits Cardiovascular: regular rate and rhythm, no murmurs Musculoskeletal: no deformity Skin:  no rash/petichiae Vascular:  Normal pulses all extremities  Neurologic Exam Mental Status: Awake and fully alert. Oriented to place and time. Recent and remote memory intact. Attention span, concentration and fund of knowledge appropriate. Mood and affect appropriate.  Cranial Nerves: Fundoscopic exam reveals sharp disc margins. Pupils equal, briskly reactive to light. Extraocular movements full without nystagmus. Visual fields full to confrontation. Hearing intact. Facial sensation intact. Face, tongue, palate moves normally and symmetrically.  Motor: Normal bulk and tone. Normal strength in all tested extremity muscles. Sensory.: intact to touch , pinprick , position and vibratory sensation.  Coordination: Rapid alternating movements normal in all extremities. Finger-to-nose and heel-to-shin performed accurately bilaterally. Gait and  Station: Arises from chair without difficulty. Stance is normal. Gait demonstrates normal stride length and balance with use of cane.  Romberg negative Reflexes: 1+ and symmetric. Toes downgoing.    NIHSS  0 Modified Rankin  1  Diagnostic Data (Labs, Imaging, Testing)  Ct Angio Head W Or Wo Contrast Ct Angio Neck W And/or Wo Contrast 11/24/2017 IMPRESSION:  1. Small infarct in the left thalamus stable from prior MRI. Very small infarcts in the right cerebellum are poorly visualized on CT. No new acute intracranial abnormality.  2. Patent carotid and vertebral arteries. No dissection, aneurysm, or hemodynamically significant stenosis utilizing NASCET criteria.  3. Patent anterior and posterior intracranial circulation. No large vessel occlusion, aneurysm, or significant stenosis.  4. Mild calcific atherosclerosis of the aortic arch, carotid bifurcations, and carotid siphons.  5. Multinodular thyroid goiter. Thyroid ultrasound is recommended on a nonemergent basis.   Mr Brain Wo Contrast 11/24/2017 IMPRESSION:  1. Acute bilateral cerebella and LEFT thalamus subcentimeter nonhemorrhagic infarcts. Findings may be related to vertebrobasilar insufficiency or, small vessel ischemic disease. Consider CTA HEAD and neck for further evaluation.  2. Otherwise negative noncontrast MRI head for age.   US Thyroid 11/25/2017 IMPRESSION:  Thyroid goiter with diffusely enlarged left lobe. Nodules in the right lobe are of low suspicion based on TI-RADS criteria and do not meet criteria for biopsy or further dedicated follow-up. The above is in keeping with the ACR TI-RADS recommendations - J Am Coll Radiol 2017;14:587-595.   Transthoracic Echocardiogram  11/25/2017 Study Conclusions - Left ventricle: The cavity size was normal. Systolic function was normal. The estimated ejection fraction was in the range of 60% to 65%. Wall motion was normal; there were no regional wall motion abnormalities.  Doppler parameters are consistent with abnormal left ventricular relaxation (grade 1 diastolic dysfunction). Doppler parameters are consistent with elevated ventricular end-diastolic filling pressure. - Aortic valve: Valve area (VTI): 2.06 cm^2. Valve area (Vmax): 1.9 cm^2. Valve area (Vmean): 2.06 cm^2. - Left atrium: The atrium was moderately dilated. - Right ventricle: Systolic function was normal. - Right atrium: The atrium was normal in size. - Pulmonic valve: There was no regurgitation. - Pericardium, extracardiac: The pericardium was normal in appearance. Impressions: - No cardiac source of emboli was indentified.    ASSESSMENT: Susan Nichols is a 67 y.o. year old female here with left thalamic and right cerebellum  infarct on 11/22/17 secondary to small vessel disease but recommended to rule out cardioembolic. Vascular risk factors include HTN and HLD.  Patient returns today for hospital stroke follow-up and overall doing well with only mild gait instability.    PLAN: -Continue aspirin 81 mg daily  and Lipitor for secondary stroke prevention -F/u with PCP regarding your HLD and HTN management -Continue home PT and advised if additional outpatient orders were needed, to call us and they will be placed -continue to monitor BP at home -Advised to continue to stay active and maintain a healthy diet -Maintain strict control of hypertension with blood pressure goal below 130/90, diabetes with hemoglobin A1c goal below 6.5% and cholesterol with LDL cholesterol (bad cholesterol) goal below 70 mg/dL. I also advised the patient to eat a healthy diet with plenty of whole grains, cereals, fruits and vegetables, exercise regularly and maintain ideal body weight.  Follow up in 3 months or call earlier if needed   Greater than 50% of time during this 25 minute visit was spent on counseling,explanation of diagnosis of left thalamic and right cerebellar infarct, reviewing risk factor  management of HLD and HTN, planning of further management, discussion with patient and family and coordination of care    Venancio Poisson, [facility]  [facility] [address] [place], [place] [address]  Phone [phone] Fax [phone]

## 2017-12-27 NOTE — Patient Instructions (Addendum)
Continue aspirin 81 mg daily  and lipitor  for secondary stroke prevention  Continue to follow up with PCP regarding choleterol and blood pressure management   Continue PT - if you need additional therapy outpatient, let me know and I will place orders  Continue to monitor blood pressure at home  Maintain strict control of hypertension with blood pressure goal below 130/90, diabetes with hemoglobin A1c goal below 6.5% and cholesterol with LDL cholesterol (bad cholesterol) goal below 70 mg/dL. I also advised the patient to eat a healthy diet with plenty of whole grains, cereals, fruits and vegetables, exercise regularly and maintain ideal body weight.  Followup in the future with me in 3 months or call earlier if needed        Thank you for coming to see [place] at [facility]. I hope we have been able to provide you high quality care today.  You may receive a patient satisfaction survey over the next few weeks. We would appreciate your feedback and comments so that we may continue to improve ourselves and the health of our patients.

## 2017-12-28 DIAGNOSIS — F419 Anxiety disorder, unspecified: Secondary | ICD-10-CM | POA: Diagnosis not present

## 2017-12-28 DIAGNOSIS — N390 Urinary tract infection, site not specified: Secondary | ICD-10-CM | POA: Diagnosis not present

## 2017-12-28 DIAGNOSIS — Z96651 Presence of right artificial knee joint: Secondary | ICD-10-CM | POA: Diagnosis not present

## 2017-12-28 DIAGNOSIS — Z7902 Long term (current) use of antithrombotics/antiplatelets: Secondary | ICD-10-CM | POA: Diagnosis not present

## 2017-12-28 DIAGNOSIS — I10 Essential (primary) hypertension: Secondary | ICD-10-CM | POA: Diagnosis not present

## 2017-12-28 DIAGNOSIS — E785 Hyperlipidemia, unspecified: Secondary | ICD-10-CM | POA: Diagnosis not present

## 2017-12-28 DIAGNOSIS — Z8673 Personal history of transient ischemic attack (TIA), and cerebral infarction without residual deficits: Secondary | ICD-10-CM | POA: Diagnosis not present

## 2017-12-28 NOTE — Progress Notes (Signed)
I agree with the above plan 

## 2018-01-03 DIAGNOSIS — Z96651 Presence of right artificial knee joint: Secondary | ICD-10-CM | POA: Diagnosis not present

## 2018-01-03 DIAGNOSIS — Z8673 Personal history of transient ischemic attack (TIA), and cerebral infarction without residual deficits: Secondary | ICD-10-CM | POA: Diagnosis not present

## 2018-01-03 DIAGNOSIS — E785 Hyperlipidemia, unspecified: Secondary | ICD-10-CM | POA: Diagnosis not present

## 2018-01-03 DIAGNOSIS — F419 Anxiety disorder, unspecified: Secondary | ICD-10-CM | POA: Diagnosis not present

## 2018-01-03 DIAGNOSIS — I10 Essential (primary) hypertension: Secondary | ICD-10-CM | POA: Diagnosis not present

## 2018-01-03 DIAGNOSIS — Z7902 Long term (current) use of antithrombotics/antiplatelets: Secondary | ICD-10-CM | POA: Diagnosis not present

## 2018-01-03 DIAGNOSIS — N390 Urinary tract infection, site not specified: Secondary | ICD-10-CM | POA: Diagnosis not present

## 2018-01-11 DIAGNOSIS — I10 Essential (primary) hypertension: Secondary | ICD-10-CM | POA: Diagnosis not present

## 2018-01-11 DIAGNOSIS — Z6841 Body Mass Index (BMI) 40.0 and over, adult: Secondary | ICD-10-CM | POA: Diagnosis not present

## 2018-01-11 DIAGNOSIS — G464 Cerebellar stroke syndrome: Secondary | ICD-10-CM | POA: Diagnosis not present

## 2018-01-18 ENCOUNTER — Ambulatory Visit (INDEPENDENT_AMBULATORY_CARE_PROVIDER_SITE_OTHER): Payer: Medicare HMO

## 2018-01-18 ENCOUNTER — Other Ambulatory Visit: Payer: Self-pay | Admitting: Family Medicine

## 2018-01-18 DIAGNOSIS — R000 Tachycardia, unspecified: Secondary | ICD-10-CM

## 2018-01-18 DIAGNOSIS — I639 Cerebral infarction, unspecified: Secondary | ICD-10-CM

## 2018-01-25 DIAGNOSIS — E559 Vitamin D deficiency, unspecified: Secondary | ICD-10-CM | POA: Diagnosis not present

## 2018-01-25 DIAGNOSIS — I10 Essential (primary) hypertension: Secondary | ICD-10-CM | POA: Diagnosis not present

## 2018-01-25 DIAGNOSIS — E6609 Other obesity due to excess calories: Secondary | ICD-10-CM | POA: Diagnosis not present

## 2018-01-25 DIAGNOSIS — I63412 Cerebral infarction due to embolism of left middle cerebral artery: Secondary | ICD-10-CM | POA: Diagnosis not present

## 2018-02-21 DIAGNOSIS — G464 Cerebellar stroke syndrome: Secondary | ICD-10-CM | POA: Diagnosis not present

## 2018-02-21 DIAGNOSIS — Z6836 Body mass index (BMI) 36.0-36.9, adult: Secondary | ICD-10-CM | POA: Diagnosis not present

## 2018-02-21 DIAGNOSIS — I10 Essential (primary) hypertension: Secondary | ICD-10-CM | POA: Diagnosis not present

## 2018-02-21 DIAGNOSIS — E7800 Pure hypercholesterolemia, unspecified: Secondary | ICD-10-CM | POA: Diagnosis not present

## 2018-02-21 DIAGNOSIS — M170 Bilateral primary osteoarthritis of knee: Secondary | ICD-10-CM | POA: Diagnosis not present

## 2018-03-22 DIAGNOSIS — R000 Tachycardia, unspecified: Secondary | ICD-10-CM | POA: Diagnosis not present

## 2018-03-22 DIAGNOSIS — E785 Hyperlipidemia, unspecified: Secondary | ICD-10-CM | POA: Diagnosis not present

## 2018-03-22 DIAGNOSIS — I10 Essential (primary) hypertension: Secondary | ICD-10-CM | POA: Diagnosis not present

## 2018-03-22 DIAGNOSIS — Z0000 Encounter for general adult medical examination without abnormal findings: Secondary | ICD-10-CM | POA: Diagnosis not present

## 2018-03-22 DIAGNOSIS — Z6836 Body mass index (BMI) 36.0-36.9, adult: Secondary | ICD-10-CM | POA: Diagnosis not present

## 2018-03-29 ENCOUNTER — Ambulatory Visit: Payer: Medicare HMO | Admitting: Adult Health

## 2018-03-29 ENCOUNTER — Encounter: Payer: Self-pay | Admitting: Adult Health

## 2018-03-29 VITALS — BP 170/62 | HR 96 | Ht 64.0 in | Wt 226.0 lb

## 2018-03-29 DIAGNOSIS — E785 Hyperlipidemia, unspecified: Secondary | ICD-10-CM | POA: Diagnosis not present

## 2018-03-29 DIAGNOSIS — I63533 Cerebral infarction due to unspecified occlusion or stenosis of bilateral posterior cerebral arteries: Secondary | ICD-10-CM | POA: Diagnosis not present

## 2018-03-29 DIAGNOSIS — I10 Essential (primary) hypertension: Secondary | ICD-10-CM

## 2018-03-29 MED ORDER — ASPIRIN 81 MG PO TABS: 81.0000 mg | ORAL_TABLET | Freq: Every day | ORAL | 0 refills | Status: AC

## 2018-03-29 NOTE — Progress Notes (Signed)
[facility] [address] [place]. [place] [address] [phone]       OFFICE FOLLOW UP NOTE  [name] Date of Birth:  24-Jun-1950 Medical Record Number:  [id number]   Reason for visit: Stroke follow up  CHIEF COMPLAINT:  Chief Complaint  Patient presents with  . Follow-up    CVA follow up room in back hallway pt alone    HPI: Susan Nichols is being seen today in the office for acute bilateral cerebellar and left thalamic subcentimeter nonhemorrhagic infarcts secondary to likely small vessel disease but recommended to rule out cardioembolic on 3/71/0626. History obtained from patient, daughter and chart review. Reviewed all radiology images and labs personally.  [name] is a 67 y.o. female with history of hypertension, previous strokes by imaging and hyperlipidemia presenting with right-sided weakness, visual disturbance, and gait instability. She did not receive IV t-PA due to late presentation.  CT head reviewed and showed small infarct in the left thalamus along with very small infarcts in the right cerebellum.  MRI head reviewed and showed acute bilateral cerebellar and left thalamus subcentimeter nonhemorrhagic infarcts.  CTA head and neck were unremarkable.  2D echo showed an EF of 60 to 65% without cardiac source of emboli identified.  It was felt as though the strokes are likely secondary to small vessel disease however due to separate vessel territory recommended 30-day cardiac event monitor as outpatient to rule out atrial fibrillation.  LDL 58 and as patient was on Lipitor 40 mg daily previously recommended to increase to Lipitor 80 mg daily.  HTN stable during admission and recommended long-term BP goal normotensive.  A1c satisfactory at 5.9.  Patient was not on antithrombotic prior to admission and recommended DAPT for 3 weeks on aspirin alone.  Patient was discharged in satisfactory condition.  12/27/2017 visit: Patient is being seen  today for hospital follow-up and is accompanied by her daughter.  She states she has been doing well with only mild residual gait instability for which she is currently using a cane but was previously using rolling walker immediately after hospital discharge.  She continues home PT.  She also states vision has completely improved along with right hemiparesis.  She continues to take aspirin as she has completed 3-week DAPT and denies bleeding or bruising.  Continues to take Lipitor without side effects myalgias.  Blood pressure today elevated for patient at 149/69 and typically SBP 120s to 130s.  It was recommended during hospitalization for 30-day cardiac monitor but per patient, cardiologist did not believe that this was necessary and recommended to hold off on this monitoring at this time.  Denies new or worsening stroke/TIA symptoms.  Interval history 03/29/2018: Patient is being seen today for 74-month follow-up visit. Patient has been doing well. She has been ambulating without her cane and denies fall. She does endorse pain in left knee due to arthritis. Continues to take aspirin 81mg  without bleeding or bruising. Continues to take lipitor without side effects of myalgias. Blood pressure typically at home around 130-140. Blood pressure today mildly elevated at 170/62 but asymptomatic. No further concerns at this time. Denies new or worsening stroke/TIA symptoms.      ROS:   14 system review of systems performed and negative with exception of joint pain  PMH:  Past Medical History:  Diagnosis Date  . Hyperlipemia   . Hypertension   . Stroke [facility])     PSH:  Past Surgical History:  Procedure Laterality Date  .  TOTAL KNEE ARTHROPLASTY  2007   right knee    Social History:  Social History   Socioeconomic History  . Marital status: Single    Spouse name: Not on file  . Number of children: Not on file  . Years of education: Not on file  . Highest education level: Not on file    Occupational History  . Not on file  Social Needs  . Financial resource strain: Not on file  . Food insecurity:    Worry: Not on file    Inability: Not on file  . Transportation needs:    Medical: Not on file    Non-medical: Not on file  Tobacco Use  . Smoking status: Never Smoker  . Smokeless tobacco: Never Used  Substance and Sexual Activity  . Alcohol use: No  . Drug use: No  . Sexual activity: Not on file  Lifestyle  . Physical activity:    Days per week: Not on file    Minutes per session: Not on file  . Stress: Not on file  Relationships  . Social connections:    Talks on phone: Not on file    Gets together: Not on file    Attends religious service: Not on file    Active member of club or organization: Not on file    Attends meetings of clubs or organizations: Not on file    Relationship status: Not on file  . Intimate partner violence:    Fear of current or ex partner: Not on file    Emotionally abused: Not on file    Physically abused: Not on file    Forced sexual activity: Not on file  Other Topics Concern  . Not on file  Social History Narrative  . Not on file    Family History:  Family History  Problem Relation Age of Onset  . Stroke Mother   . Hypertension Mother   . Hyperlipidemia Mother   . Hypertension Father   . Hyperlipidemia Father   . Stroke Brother     Medications:   Current Outpatient Medications on File Prior to Visit  Medication Sig Dispense Refill  . acetaminophen (TYLENOL) 325 MG tablet Take 2 tablets (650 mg total) by mouth every 6 (six) hours as needed for mild pain or fever. 12 tablet 0  . ALPRAZolam (XANAX) 0.25 MG tablet Take 1 tablet (0.25 mg total) by mouth daily as needed for anxiety. 12 tablet 3  . amLODipine (NORVASC) 10 MG tablet Take 1 tablet (10 mg total) by mouth daily. 30 tablet 3  . atorvastatin (LIPITOR) 40 MG tablet Take 1 tablet (40 mg total) by mouth daily. 30 tablet 3  . chlorthalidone (HYGROTON) 25 MG tablet  Take 1 tablet (25 mg total) by mouth daily. 30 tablet 5  . cloNIDine (CATAPRES) 0.2 MG tablet Take 1 tablet (0.2 mg total) by mouth 2 (two) times daily. 60 tablet 11  . hydrALAZINE (APRESOLINE) 50 MG tablet Take 1 tablet (50 mg total) by mouth 2 (two) times daily. 60 tablet 5  . HYDROcodone-acetaminophen (NORCO/VICODIN) 5-325 MG tablet Take 1 tablet by mouth at bedtime as needed for moderate pain. 15 tablet 0  . isosorbide mononitrate (IMDUR) 60 MG 24 hr tablet Take 1 tablet (60 mg total) by mouth daily. 30 tablet 5  . lisinopril (PRINIVIL,ZESTRIL) 20 MG tablet Take 1 tablet (20 mg total) by mouth daily. 30 tablet 6  . metoprolol tartrate (LOPRESSOR) 50 MG tablet Take 1 tablet (50 mg total)  by mouth 2 (two) times daily. 60 tablet 3  . potassium chloride (MICRO-K) 10 MEQ CR capsule Take 1 capsule (10 mEq total) by mouth daily. 30 capsule 2   No current facility-administered medications on file prior to visit.     Allergies:  No Known Allergies   Physical Exam  Vitals:   03/29/18 1419  Pulse: 96  Weight: 226 lb (102.5 kg)  Height: 5\' 4"  (1.626 m)   Body mass index is 38.79 kg/m. No exam data present  General: well developed, well nourished, pleasant elderly African-American female, seated, in no evident distress Head: head normocephalic and atraumatic.   Neck: supple with no carotid or supraclavicular bruits Cardiovascular: regular rate and rhythm, no murmurs Musculoskeletal: no deformity Skin:  no rash/petichiae Vascular:  Normal pulses all extremities  Neurologic Exam Mental Status: Awake and fully alert. Oriented to place and time. Recent and remote memory intact. Attention span, concentration and fund of knowledge appropriate. Mood and affect appropriate.  Cranial Nerves: Fundoscopic exam reveals sharp disc margins. Pupils equal, briskly reactive to light. Extraocular movements full without nystagmus. Visual fields full to confrontation. Hearing intact. Facial sensation intact.  Face, tongue, palate moves normally and symmetrically.  Motor: Normal bulk and tone. Normal strength in all tested extremity muscles. Sensory.: intact to touch , pinprick , position and vibratory sensation.  Coordination: Rapid alternating movements normal in all extremities. Finger-to-nose and heel-to-shin performed accurately bilaterally. Gait and Station: Arises from chair without difficulty. Stance is normal. Gait demonstrates normal stride length and balance.  Reflexes: 1+ and symmetric. Toes downgoing.     Diagnostic Data (Labs, Imaging, Testing)  Ct Angio Head W Or Wo Contrast Ct Angio Neck W And/or Wo Contrast 11/24/2017 IMPRESSION:  1. Small infarct in the left thalamus stable from prior MRI. Very small infarcts in the right cerebellum are poorly visualized on CT. No new acute intracranial abnormality.  2. Patent carotid and vertebral arteries. No dissection, aneurysm, or hemodynamically significant stenosis utilizing NASCET criteria.  3. Patent anterior and posterior intracranial circulation. No large vessel occlusion, aneurysm, or significant stenosis.  4. Mild calcific atherosclerosis of the aortic arch, carotid bifurcations, and carotid siphons.  5. Multinodular thyroid goiter. Thyroid ultrasound is recommended on a nonemergent basis.   Mr Brain Wo Contrast 11/24/2017 IMPRESSION:  1. Acute bilateral cerebella and LEFT thalamus subcentimeter nonhemorrhagic infarcts. Findings may be related to vertebrobasilar insufficiency or, small vessel ischemic disease. Consider CTA HEAD and neck for further evaluation.  2. Otherwise negative noncontrast MRI head for age.   US Thyroid 11/25/2017 IMPRESSION:  Thyroid goiter with diffusely enlarged left lobe. Nodules in the right lobe are of low suspicion based on TI-RADS criteria and do not meet criteria for biopsy or further dedicated follow-up. The above is in keeping with the ACR TI-RADS recommendations - J Am Coll Radiol  2017;14:587-595.   Transthoracic Echocardiogram  11/25/2017 Study Conclusions - Left ventricle: The cavity size was normal. Systolic function was normal. The estimated ejection fraction was in the range of 60% to 65%. Wall motion was normal; there were no regional wall motion abnormalities. Doppler parameters are consistent with abnormal left ventricular relaxation (grade 1 diastolic dysfunction). Doppler parameters are consistent with elevated ventricular end-diastolic filling pressure. - Aortic valve: Valve area (VTI): 2.06 cm^2. Valve area (Vmax): 1.9 cm^2. Valve area (Vmean): 2.06 cm^2. - Left atrium: The atrium was moderately dilated. - Right ventricle: Systolic function was normal. - Right atrium: The atrium was normal in size. - Pulmonic valve: There  was no regurgitation. - Pericardium, extracardiac: The pericardium was normal in appearance. Impressions: - No cardiac source of emboli was indentified.    ASSESSMENT: Susan Nichols is a 67 y.o. year old female here with left thalamic and right cerebellum infarct on 11/22/17 secondary to small vessel disease but recommended to rule out cardioembolic. Vascular risk factors include HTN and HLD.  Patient is being seen today for follow-up visit and overall doing well from a stroke standpoint without residual deficits.    PLAN: -Continue aspirin 81 mg daily  and Lipitor for secondary stroke prevention -F/u with PCP regarding your HLD and HTN management -continue to monitor BP at home -Advised to continue to stay active and maintain a healthy diet -Maintain strict control of hypertension with blood pressure goal below 130/90, diabetes with hemoglobin A1c goal below 6.5% and cholesterol with LDL cholesterol (bad cholesterol) goal below 70 mg/dL. I also advised the patient to eat a healthy diet with plenty of whole grains, cereals, fruits and vegetables, exercise regularly and maintain ideal body weight.  Follow up in  6 months or call earlier if needed   Greater than 50% of time during this 25 minute visit was spent on counseling,explanation of diagnosis of left thalamic and right cerebellar infarct, reviewing risk factor management of HLD and HTN, planning of further management, discussion with patient and family and coordination of care    Venancio Poisson, [facility]  [facility] [address] [place], [place] [address]  Phone [phone] Fax [phone]

## 2018-03-29 NOTE — Patient Instructions (Signed)
Continue aspirin 81 mg daily  and lipitor  for secondary stroke prevention  Continue to follow up with PCP regarding cholesterol and blood pressure management   Continue to stay active and maintain a healthy diet  Continue to monitor blood pressure at home  Maintain strict control of hypertension with blood pressure goal below 130/90, diabetes with hemoglobin A1c goal below 6.5% and cholesterol with LDL cholesterol (bad cholesterol) goal below 70 mg/dL. I also advised the patient to eat a healthy diet with plenty of whole grains, cereals, fruits and vegetables, exercise regularly and maintain ideal body weight.  Followup in the future with me in 6 months or call earlier if needed       Thank you for coming to see [place] at [facility]. I hope we have been able to provide you high quality care today.  You may receive a patient satisfaction survey over the next few weeks. We would appreciate your feedback and comments so that we may continue to improve ourselves and the health of our patients.

## 2018-03-29 NOTE — Progress Notes (Signed)
I agree with the above plan 

## 2018-04-21 DIAGNOSIS — I63533 Cerebral infarction due to unspecified occlusion or stenosis of bilateral posterior cerebral arteries: Secondary | ICD-10-CM | POA: Diagnosis not present

## 2018-04-21 DIAGNOSIS — I10 Essential (primary) hypertension: Secondary | ICD-10-CM | POA: Diagnosis not present

## 2018-04-21 DIAGNOSIS — Z6836 Body mass index (BMI) 36.0-36.9, adult: Secondary | ICD-10-CM | POA: Diagnosis not present

## 2018-04-21 DIAGNOSIS — E785 Hyperlipidemia, unspecified: Secondary | ICD-10-CM | POA: Diagnosis not present

## 2018-05-18 DIAGNOSIS — I10 Essential (primary) hypertension: Secondary | ICD-10-CM | POA: Diagnosis not present

## 2018-05-18 DIAGNOSIS — E7849 Other hyperlipidemia: Secondary | ICD-10-CM | POA: Diagnosis not present

## 2018-05-18 DIAGNOSIS — R0602 Shortness of breath: Secondary | ICD-10-CM | POA: Diagnosis not present

## 2018-07-21 DIAGNOSIS — M130 Polyarthritis, unspecified: Secondary | ICD-10-CM | POA: Diagnosis not present

## 2018-07-21 DIAGNOSIS — G464 Cerebellar stroke syndrome: Secondary | ICD-10-CM | POA: Diagnosis not present

## 2018-07-21 DIAGNOSIS — D649 Anemia, unspecified: Secondary | ICD-10-CM | POA: Diagnosis not present

## 2018-07-21 DIAGNOSIS — M170 Bilateral primary osteoarthritis of knee: Secondary | ICD-10-CM | POA: Diagnosis not present

## 2018-09-26 ENCOUNTER — Telehealth: Payer: Self-pay

## 2018-09-26 NOTE — Telephone Encounter (Signed)
I updated medication list with Carmell Austria pts daughter and pharmacy.

## 2018-09-26 NOTE — Telephone Encounter (Signed)
Pt has [employer]  phone carrier for link to be text to [phone]. LEft vm that link will be text day of visit.

## 2018-09-26 NOTE — Telephone Encounter (Signed)
I called Susan Nichols pts daughter on dpr. I explained visit will be change to video due to COVID 19. I receive verbal consent to do video with her mom and to file her insurance. Her mom has a cell phone and camera. She will help her mom with the visit tomorrow. I explain the doxy process and to log on 10 minutes to the visit at 115pm.

## 2018-09-27 ENCOUNTER — Ambulatory Visit (INDEPENDENT_AMBULATORY_CARE_PROVIDER_SITE_OTHER): Payer: Medicare HMO | Admitting: Adult Health

## 2018-09-27 ENCOUNTER — Other Ambulatory Visit: Payer: Self-pay

## 2018-09-27 ENCOUNTER — Encounter: Payer: Self-pay | Admitting: Adult Health

## 2018-09-27 DIAGNOSIS — I63533 Cerebral infarction due to unspecified occlusion or stenosis of bilateral posterior cerebral arteries: Secondary | ICD-10-CM | POA: Diagnosis not present

## 2018-09-27 DIAGNOSIS — I10 Essential (primary) hypertension: Secondary | ICD-10-CM | POA: Diagnosis not present

## 2018-09-27 DIAGNOSIS — E785 Hyperlipidemia, unspecified: Secondary | ICD-10-CM | POA: Diagnosis not present

## 2018-09-27 NOTE — Progress Notes (Signed)
[facility] [address] [place]. [place] [address] [phone]       FOLLOW UP NOTE  [name] Date of Birth:  [birth year] Medical Record Number:  [id number]   Reason for visit: Stroke follow up  Virtual Visit via Video Note  I connected with Susan Nichols on 09/27/18 at  1:15 PM EDT by a video enabled telemedicine application located remotely in my own home and verified that I am speaking with the correct person using two identifiers who was located at their own home.   Visit facilitated by Katharine Look, RN who discussed the limitations of evaluation and management by telemedicine and the availability of in person appointments. The patient expressed understanding and agreed to proceed.  See phone note for additional contact information.     HPI: Lakishia Bourassa continues to be followed in this office regarding bilateral cerebellar and left thalamic subcentimeter nonhemorrhagic infarcts secondary to likely small vessel disease on 11/24/2017.  Initially scheduled today for office follow-up visit but due to COVID-19 safety precautions, visit transition to telemedicine via [email or web address] with patient's consent.  [name] is a 68 y.o. female with history of hypertension, previous strokes by imaging and hyperlipidemia  who presented with right-sided weakness, visual disturbance, and gait instability.   Stroke work-up revealed acute bilateral cerebellar and left thalamus subcentimeter nonhemorrhagic infarcts.  Vessel imaging and 2D echo unremarkable.  Recommended DAPT for 3 weeks on aspirin alone.  Due to 2 separate areas of stroke, recommended 30-day cardiac event monitor to rule out A. fib but likely etiology small vessel disease.  Patient was discharged in stable condition.  Completed 30-day cardiac event monitor in 01/2018 which was negative for A. fib.  She has been stable from a stroke standpoint without residual deficits or reoccurring of symptoms.   She was initially ambulating with a cane but currently ambulating without assistive device and denies any recent falls.  Continues on aspirin 81 mg and atorvastatin 40 mg without reported side effects.  Blood pressure monitored at home which has been stable.  No further concerns at this time.  Denies new or worsening stroke/TIA symptoms.      ROS:   14 system review of systems performed and negative with exception of no complaints  PMH:  Past Medical History:  Diagnosis Date  . Hyperlipemia   . Hypertension   . Stroke [facility])     PSH:  Past Surgical History:  Procedure Laterality Date  . TOTAL KNEE ARTHROPLASTY  2007   right knee    Social History:  Social History   Socioeconomic History  . Marital status: Single    Spouse name: Not on file  . Number of children: Not on file  . Years of education: Not on file  . Highest education level: Not on file  Occupational History  . Not on file  Social Needs  . Financial resource strain: Not on file  . Food insecurity:    Worry: Not on file    Inability: Not on file  . Transportation needs:    Medical: Not on file    Non-medical: Not on file  Tobacco Use  . Smoking status: Never Smoker  . Smokeless tobacco: Never Used  Substance and Sexual Activity  . Alcohol use: No  . Drug use: No  . Sexual activity: Not on file  Lifestyle  . Physical activity:    Days per week: Not on file    Minutes per session: Not on file  .  Stress: Not on file  Relationships  . Social connections:    Talks on phone: Not on file    Gets together: Not on file    Attends religious service: Not on file    Active member of club or organization: Not on file    Attends meetings of clubs or organizations: Not on file    Relationship status: Not on file  . Intimate partner violence:    Fear of current or ex partner: Not on file    Emotionally abused: Not on file    Physically abused: Not on file    Forced sexual activity: Not on file  Other  Topics Concern  . Not on file  Social History Narrative  . Not on file    Family History:  Family History  Problem Relation Age of Onset  . Stroke Mother   . Hypertension Mother   . Hyperlipidemia Mother   . Hypertension Father   . Hyperlipidemia Father   . Stroke Brother     Medications:   Current Outpatient Medications on File Prior to Visit  Medication Sig Dispense Refill  . acetaminophen (TYLENOL) 325 MG tablet Take 2 tablets (650 mg total) by mouth every 6 (six) hours as needed for mild pain or fever. 12 tablet 0  . ALPRAZolam (XANAX) 0.25 MG tablet Take 1 tablet (0.25 mg total) by mouth daily as needed for anxiety. 12 tablet 3  . amLODipine (NORVASC) 10 MG tablet Take 1 tablet (10 mg total) by mouth daily. 30 tablet 3  . aspirin 81 MG tablet Take 1 tablet (81 mg total) by mouth daily. 30 tablet 0  . atorvastatin (LIPITOR) 40 MG tablet Take 1 tablet (40 mg total) by mouth daily. 30 tablet 3  . chlorthalidone (HYGROTON) 25 MG tablet Take 1 tablet (25 mg total) by mouth daily. 30 tablet 5  . cloNIDine (CATAPRES) 0.2 MG tablet Take 1 tablet (0.2 mg total) by mouth 2 (two) times daily. 60 tablet 11  . hydrALAZINE (APRESOLINE) 50 MG tablet Take 1 tablet (50 mg total) by mouth 2 (two) times daily. 60 tablet 5  . HYDROcodone-acetaminophen (NORCO/VICODIN) 5-325 MG tablet Take 1 tablet by mouth at bedtime as needed for moderate pain. 15 tablet 0  . isosorbide mononitrate (IMDUR) 60 MG 24 hr tablet Take 1 tablet (60 mg total) by mouth daily. 30 tablet 5  . lisinopril (PRINIVIL,ZESTRIL) 20 MG tablet Take 1 tablet (20 mg total) by mouth daily. 30 tablet 6  . metoprolol tartrate (LOPRESSOR) 50 MG tablet Take 1 tablet (50 mg total) by mouth 2 (two) times daily. 60 tablet 3  . potassium chloride (MICRO-K) 10 MEQ CR capsule Take 1 capsule (10 mEq total) by mouth daily. 30 capsule 2   No current facility-administered medications on file prior to visit.     Allergies:  No Known Allergies    Physical Exam  General: well developed, well nourished, pleasant middle-aged African-American female, seated, in no evident distress Head: head normocephalic and atraumatic.    Neurologic Exam Mental Status: Awake and fully alert. Oriented to place and time. Recent and remote memory intact. Attention span, concentration and fund of knowledge appropriate. Mood and affect appropriate.  Cranial Nerves: Extraocular movements full without nystagmus. Hearing intact to voice. Facial sensation intact. Face, tongue, palate moves normally and symmetrically.  Shoulder shrug symmetric. Motor: No evidence of weakness per drift assessment Sensory.: intact to light touch Coordination: Rapid alternating movements normal in all extremities. Finger-to-nose and heel-to-shin performed accurately  bilaterally. Gait and Station: Arises from chair without difficulty. Stance is normal. Gait demonstrates normal stride length and balance  Reflexes: UTA     Diagnostic Data (Labs, Imaging, Testing)  Ct Angio Head W Or Wo Contrast Ct Angio Neck W And/or Wo Contrast 11/24/2017 IMPRESSION:  1. Small infarct in the left thalamus stable from prior MRI. Very small infarcts in the right cerebellum are poorly visualized on CT. No new acute intracranial abnormality.  2. Patent carotid and vertebral arteries. No dissection, aneurysm, or hemodynamically significant stenosis utilizing NASCET criteria.  3. Patent anterior and posterior intracranial circulation. No large vessel occlusion, aneurysm, or significant stenosis.  4. Mild calcific atherosclerosis of the aortic arch, carotid bifurcations, and carotid siphons.  5. Multinodular thyroid goiter. Thyroid ultrasound is recommended on a nonemergent basis.   Mr Brain Wo Contrast 11/24/2017 IMPRESSION:  1. Acute bilateral cerebella and LEFT thalamus subcentimeter nonhemorrhagic infarcts. Findings may be related to vertebrobasilar insufficiency or, small vessel ischemic  disease. Consider CTA HEAD and neck for further evaluation.  2. Otherwise negative noncontrast MRI head for age.   US Thyroid 11/25/2017 IMPRESSION:  Thyroid goiter with diffusely enlarged left lobe. Nodules in the right lobe are of low suspicion based on TI-RADS criteria and do not meet criteria for biopsy or further dedicated follow-up. The above is in keeping with the ACR TI-RADS recommendations - J Am Coll Radiol 2017;14:587-595.   Transthoracic Echocardiogram  11/25/2017 Study Conclusions - Left ventricle: The cavity size was normal. Systolic function was normal. The estimated ejection fraction was in the range of 60% to 65%. Wall motion was normal; there were no regional wall motion abnormalities. Doppler parameters are consistent with abnormal left ventricular relaxation (grade 1 diastolic dysfunction). Doppler parameters are consistent with elevated ventricular end-diastolic filling pressure. - Aortic valve: Valve area (VTI): 2.06 cm^2. Valve area (Vmax): 1.9 cm^2. Valve area (Vmean): 2.06 cm^2. - Left atrium: The atrium was moderately dilated. - Right ventricle: Systolic function was normal. - Right atrium: The atrium was normal in size. - Pulmonic valve: There was no regurgitation. - Pericardium, extracardiac: The pericardium was normal in appearance. Impressions: - No cardiac source of emboli was indentified.    ASSESSMENT: Susan Nichols is a 68 y.o. year old female here with left thalamic and right cerebellum infarct on 11/22/17 secondary to small vessel disease but recommended to rule out cardioembolic.  30-day cardiac event monitor negative for atrial fibrillation.  Vascular risk factors include HTN and HLD.  She has been stable from a stroke standpoint without residual deficits or reoccurring of symptoms.    PLAN: -Continue aspirin 81 mg daily  and Lipitor for secondary stroke prevention -F/u with PCP regarding your HLD and HTN management  -continue to monitor BP at home -Advised to continue to stay active and maintain a healthy diet -Maintain strict control of hypertension with blood pressure goal below 130/90, diabetes with hemoglobin A1c goal below 6.5% and cholesterol with LDL cholesterol (bad cholesterol) goal below 70 mg/dL. I also advised the patient to eat a healthy diet with plenty of whole grains, cereals, fruits and vegetables, exercise regularly and maintain ideal body weight.  Stable from stroke standpoint and recommend follow-up as needed   Greater than 50% of time during this 15 minute non-face-to-face visit was spent on counseling,explanation of diagnosis of left thalamic and right cerebellar infarct, reviewing risk factor management of HLD and HTN, planning of further management, discussion with patient and family and coordination of care    Venancio Poisson,  AGNP-BC  [facility] [address] [place], [place] [address]  Phone [phone] Fax [phone]

## 2018-09-29 NOTE — Progress Notes (Signed)
I agree with the above plan 

## 2018-11-30 DIAGNOSIS — R000 Tachycardia, unspecified: Secondary | ICD-10-CM | POA: Diagnosis not present

## 2018-11-30 DIAGNOSIS — E7849 Other hyperlipidemia: Secondary | ICD-10-CM | POA: Diagnosis not present

## 2018-11-30 DIAGNOSIS — I10 Essential (primary) hypertension: Secondary | ICD-10-CM | POA: Diagnosis not present

## 2018-12-07 DIAGNOSIS — R000 Tachycardia, unspecified: Secondary | ICD-10-CM | POA: Diagnosis not present

## 2018-12-07 DIAGNOSIS — R072 Precordial pain: Secondary | ICD-10-CM | POA: Diagnosis not present

## 2018-12-07 DIAGNOSIS — I10 Essential (primary) hypertension: Secondary | ICD-10-CM | POA: Diagnosis not present

## 2019-01-01 ENCOUNTER — Other Ambulatory Visit: Payer: Self-pay

## 2019-01-01 DIAGNOSIS — Z20822 Contact with and (suspected) exposure to covid-19: Secondary | ICD-10-CM

## 2019-01-02 LAB — NOVEL CORONAVIRUS, NAA: SARS-CoV-2, NAA: NOT DETECTED

## 2019-01-22 ENCOUNTER — Other Ambulatory Visit: Payer: Self-pay | Admitting: *Deleted

## 2019-01-22 DIAGNOSIS — Z20822 Contact with and (suspected) exposure to covid-19: Secondary | ICD-10-CM

## 2019-01-22 DIAGNOSIS — R6889 Other general symptoms and signs: Secondary | ICD-10-CM | POA: Diagnosis not present

## 2019-01-24 LAB — NOVEL CORONAVIRUS, NAA: SARS-CoV-2, NAA: NOT DETECTED

## 2019-01-26 ENCOUNTER — Telehealth: Payer: Self-pay | Admitting: Family Medicine

## 2019-01-26 NOTE — Telephone Encounter (Signed)
Negative COVID results given. Patient results "NOT Detected." Caller expressed understanding. ° °

## 2019-05-23 DIAGNOSIS — M25571 Pain in right ankle and joints of right foot: Secondary | ICD-10-CM | POA: Diagnosis not present

## 2019-05-23 DIAGNOSIS — I10 Essential (primary) hypertension: Secondary | ICD-10-CM | POA: Diagnosis not present

## 2019-05-23 DIAGNOSIS — E7849 Other hyperlipidemia: Secondary | ICD-10-CM | POA: Diagnosis not present

## 2019-08-22 DIAGNOSIS — I425 Other restrictive cardiomyopathy: Secondary | ICD-10-CM | POA: Diagnosis not present

## 2019-08-22 DIAGNOSIS — I340 Nonrheumatic mitral (valve) insufficiency: Secondary | ICD-10-CM | POA: Diagnosis not present

## 2019-08-22 DIAGNOSIS — I361 Nonrheumatic tricuspid (valve) insufficiency: Secondary | ICD-10-CM | POA: Diagnosis not present

## 2020-05-21 DIAGNOSIS — E7849 Other hyperlipidemia: Secondary | ICD-10-CM | POA: Diagnosis not present

## 2020-05-21 DIAGNOSIS — M25562 Pain in left knee: Secondary | ICD-10-CM | POA: Diagnosis not present

## 2020-05-21 DIAGNOSIS — I10 Essential (primary) hypertension: Secondary | ICD-10-CM | POA: Diagnosis not present

## 2020-08-20 DIAGNOSIS — E7849 Other hyperlipidemia: Secondary | ICD-10-CM | POA: Diagnosis not present

## 2020-08-20 DIAGNOSIS — I10 Essential (primary) hypertension: Secondary | ICD-10-CM | POA: Diagnosis not present

## 2020-08-20 DIAGNOSIS — M25511 Pain in right shoulder: Secondary | ICD-10-CM | POA: Diagnosis not present

## 2020-10-30 DIAGNOSIS — I509 Heart failure, unspecified: Secondary | ICD-10-CM | POA: Diagnosis not present

## 2020-10-30 DIAGNOSIS — I110 Hypertensive heart disease with heart failure: Secondary | ICD-10-CM | POA: Diagnosis not present

## 2020-10-30 DIAGNOSIS — F4321 Adjustment disorder with depressed mood: Secondary | ICD-10-CM | POA: Diagnosis not present

## 2020-10-30 DIAGNOSIS — D7212 Drug rash with eosinophilia and systemic symptoms syndrome: Secondary | ICD-10-CM | POA: Diagnosis not present

## 2020-10-30 DIAGNOSIS — I10 Essential (primary) hypertension: Secondary | ICD-10-CM | POA: Diagnosis not present

## 2020-10-30 DIAGNOSIS — R600 Localized edema: Secondary | ICD-10-CM | POA: Diagnosis not present

## 2020-10-30 DIAGNOSIS — F4320 Adjustment disorder, unspecified: Secondary | ICD-10-CM | POA: Diagnosis not present

## 2020-10-30 DIAGNOSIS — E559 Vitamin D deficiency, unspecified: Secondary | ICD-10-CM | POA: Diagnosis not present

## 2020-10-30 DIAGNOSIS — M130 Polyarthritis, unspecified: Secondary | ICD-10-CM | POA: Diagnosis not present

## 2020-11-20 DIAGNOSIS — D649 Anemia, unspecified: Secondary | ICD-10-CM | POA: Diagnosis not present

## 2020-11-20 DIAGNOSIS — E785 Hyperlipidemia, unspecified: Secondary | ICD-10-CM | POA: Diagnosis not present

## 2020-11-20 DIAGNOSIS — I10 Essential (primary) hypertension: Secondary | ICD-10-CM | POA: Diagnosis not present

## 2020-11-20 DIAGNOSIS — M130 Polyarthritis, unspecified: Secondary | ICD-10-CM | POA: Diagnosis not present

## 2020-11-20 DIAGNOSIS — I63533 Cerebral infarction due to unspecified occlusion or stenosis of bilateral posterior cerebral arteries: Secondary | ICD-10-CM | POA: Diagnosis not present

## 2020-11-21 DIAGNOSIS — I10 Essential (primary) hypertension: Secondary | ICD-10-CM | POA: Diagnosis not present

## 2020-11-21 DIAGNOSIS — R0602 Shortness of breath: Secondary | ICD-10-CM | POA: Diagnosis not present

## 2020-11-21 DIAGNOSIS — R21 Rash and other nonspecific skin eruption: Secondary | ICD-10-CM | POA: Diagnosis not present

## 2021-01-22 DIAGNOSIS — R600 Localized edema: Secondary | ICD-10-CM | POA: Diagnosis not present

## 2021-01-22 DIAGNOSIS — E785 Hyperlipidemia, unspecified: Secondary | ICD-10-CM | POA: Diagnosis not present

## 2021-01-22 DIAGNOSIS — I10 Essential (primary) hypertension: Secondary | ICD-10-CM | POA: Diagnosis not present

## 2021-01-22 DIAGNOSIS — M130 Polyarthritis, unspecified: Secondary | ICD-10-CM | POA: Diagnosis not present

## 2021-01-22 DIAGNOSIS — I63533 Cerebral infarction due to unspecified occlusion or stenosis of bilateral posterior cerebral arteries: Secondary | ICD-10-CM | POA: Diagnosis not present

## 2021-02-20 DIAGNOSIS — I110 Hypertensive heart disease with heart failure: Secondary | ICD-10-CM | POA: Diagnosis not present

## 2021-02-20 DIAGNOSIS — I509 Heart failure, unspecified: Secondary | ICD-10-CM | POA: Diagnosis not present

## 2021-02-20 DIAGNOSIS — I63533 Cerebral infarction due to unspecified occlusion or stenosis of bilateral posterior cerebral arteries: Secondary | ICD-10-CM | POA: Diagnosis not present

## 2021-02-20 DIAGNOSIS — M130 Polyarthritis, unspecified: Secondary | ICD-10-CM | POA: Diagnosis not present

## 2021-03-05 DIAGNOSIS — M25511 Pain in right shoulder: Secondary | ICD-10-CM | POA: Diagnosis not present

## 2021-03-05 DIAGNOSIS — I340 Nonrheumatic mitral (valve) insufficiency: Secondary | ICD-10-CM | POA: Diagnosis not present

## 2021-03-05 DIAGNOSIS — M25561 Pain in right knee: Secondary | ICD-10-CM | POA: Diagnosis not present

## 2021-03-05 DIAGNOSIS — R072 Precordial pain: Secondary | ICD-10-CM | POA: Diagnosis not present

## 2021-03-05 DIAGNOSIS — M25562 Pain in left knee: Secondary | ICD-10-CM | POA: Diagnosis not present

## 2021-03-05 DIAGNOSIS — I10 Essential (primary) hypertension: Secondary | ICD-10-CM | POA: Diagnosis not present

## 2021-03-26 DIAGNOSIS — I110 Hypertensive heart disease with heart failure: Secondary | ICD-10-CM | POA: Diagnosis not present

## 2021-03-26 DIAGNOSIS — R739 Hyperglycemia, unspecified: Secondary | ICD-10-CM | POA: Diagnosis not present

## 2021-03-26 DIAGNOSIS — R221 Localized swelling, mass and lump, neck: Secondary | ICD-10-CM | POA: Diagnosis not present

## 2021-03-26 DIAGNOSIS — R600 Localized edema: Secondary | ICD-10-CM | POA: Diagnosis not present

## 2021-03-26 DIAGNOSIS — I63533 Cerebral infarction due to unspecified occlusion or stenosis of bilateral posterior cerebral arteries: Secondary | ICD-10-CM | POA: Diagnosis not present

## 2021-03-26 DIAGNOSIS — I509 Heart failure, unspecified: Secondary | ICD-10-CM | POA: Diagnosis not present

## 2021-03-26 DIAGNOSIS — M130 Polyarthritis, unspecified: Secondary | ICD-10-CM | POA: Diagnosis not present

## 2021-03-26 DIAGNOSIS — I10 Essential (primary) hypertension: Secondary | ICD-10-CM | POA: Diagnosis not present

## 2021-03-26 DIAGNOSIS — Z0001 Encounter for general adult medical examination with abnormal findings: Secondary | ICD-10-CM | POA: Diagnosis not present

## 2021-04-07 ENCOUNTER — Other Ambulatory Visit: Payer: Self-pay | Admitting: Family Medicine

## 2021-04-07 ENCOUNTER — Other Ambulatory Visit (HOSPITAL_COMMUNITY): Payer: Self-pay | Admitting: Family Medicine

## 2021-04-07 DIAGNOSIS — N390 Urinary tract infection, site not specified: Secondary | ICD-10-CM | POA: Diagnosis not present

## 2021-04-07 DIAGNOSIS — I110 Hypertensive heart disease with heart failure: Secondary | ICD-10-CM | POA: Diagnosis not present

## 2021-04-07 DIAGNOSIS — Z6841 Body Mass Index (BMI) 40.0 and over, adult: Secondary | ICD-10-CM | POA: Diagnosis not present

## 2021-04-07 DIAGNOSIS — M130 Polyarthritis, unspecified: Secondary | ICD-10-CM | POA: Diagnosis not present

## 2021-04-07 DIAGNOSIS — R221 Localized swelling, mass and lump, neck: Secondary | ICD-10-CM

## 2021-04-07 DIAGNOSIS — I10 Essential (primary) hypertension: Secondary | ICD-10-CM | POA: Diagnosis not present

## 2021-04-07 DIAGNOSIS — R600 Localized edema: Secondary | ICD-10-CM | POA: Diagnosis not present

## 2021-04-20 ENCOUNTER — Ambulatory Visit (HOSPITAL_COMMUNITY)
Admission: RE | Admit: 2021-04-20 | Discharge: 2021-04-20 | Disposition: A | Discharge status: Home / Self Care | Payer: Medicare HMO | Source: Ambulatory Visit | Attending: Family Medicine | Admitting: Family Medicine

## 2021-04-20 ENCOUNTER — Other Ambulatory Visit: Payer: Self-pay

## 2021-04-20 ENCOUNTER — Encounter (HOSPITAL_COMMUNITY)
Admission: RE | Admit: 2021-04-20 | Discharge: 2021-04-20 | Disposition: A | Discharge status: Home / Self Care | Payer: Medicare HMO | Source: Ambulatory Visit | Attending: Family Medicine | Admitting: Family Medicine

## 2021-04-20 DIAGNOSIS — R221 Localized swelling, mass and lump, neck: Secondary | ICD-10-CM | POA: Insufficient documentation

## 2021-04-20 MED ORDER — SODIUM IODIDE I 131 CAPSULE
10.8000 | Freq: Once | INTRAVENOUS | Status: AC | PRN
  Administered 2021-04-20: 10.8 via ORAL

## 2021-04-21 ENCOUNTER — Encounter (HOSPITAL_COMMUNITY)
Admission: RE | Admit: 2021-04-21 | Discharge: 2021-04-21 | Disposition: A | Discharge status: Home / Self Care | Payer: Medicare HMO | Source: Ambulatory Visit | Attending: Family Medicine | Admitting: Family Medicine

## 2021-04-21 DIAGNOSIS — E042 Nontoxic multinodular goiter: Secondary | ICD-10-CM | POA: Diagnosis not present

## 2021-04-23 DIAGNOSIS — I10 Essential (primary) hypertension: Secondary | ICD-10-CM | POA: Diagnosis not present

## 2021-04-23 DIAGNOSIS — R221 Localized swelling, mass and lump, neck: Secondary | ICD-10-CM | POA: Diagnosis not present

## 2021-04-23 DIAGNOSIS — G464 Cerebellar stroke syndrome: Secondary | ICD-10-CM | POA: Diagnosis not present

## 2021-05-15 DIAGNOSIS — R221 Localized swelling, mass and lump, neck: Secondary | ICD-10-CM | POA: Diagnosis not present

## 2021-05-15 DIAGNOSIS — G464 Cerebellar stroke syndrome: Secondary | ICD-10-CM | POA: Diagnosis not present

## 2021-05-18 ENCOUNTER — Other Ambulatory Visit: Payer: Self-pay | Admitting: Family Medicine

## 2021-05-18 ENCOUNTER — Other Ambulatory Visit (HOSPITAL_COMMUNITY): Payer: Self-pay | Admitting: Family Medicine

## 2021-05-18 DIAGNOSIS — E041 Nontoxic single thyroid nodule: Secondary | ICD-10-CM

## 2021-05-25 ENCOUNTER — Ambulatory Visit (HOSPITAL_COMMUNITY)
Admission: RE | Admit: 2021-05-25 | Discharge: 2021-05-25 | Disposition: A | Discharge status: Home / Self Care | Payer: Medicare HMO | Source: Ambulatory Visit | Attending: Family Medicine | Admitting: Family Medicine

## 2021-05-25 ENCOUNTER — Other Ambulatory Visit: Payer: Self-pay

## 2021-05-25 DIAGNOSIS — E010 Iodine-deficiency related diffuse (endemic) goiter: Secondary | ICD-10-CM | POA: Diagnosis not present

## 2021-05-25 DIAGNOSIS — E041 Nontoxic single thyroid nodule: Secondary | ICD-10-CM | POA: Diagnosis not present

## 2021-05-25 DIAGNOSIS — E042 Nontoxic multinodular goiter: Secondary | ICD-10-CM | POA: Diagnosis not present

## 2021-06-10 DIAGNOSIS — I340 Nonrheumatic mitral (valve) insufficiency: Secondary | ICD-10-CM | POA: Diagnosis not present

## 2021-06-10 DIAGNOSIS — R072 Precordial pain: Secondary | ICD-10-CM | POA: Diagnosis not present

## 2021-06-10 DIAGNOSIS — I10 Essential (primary) hypertension: Secondary | ICD-10-CM | POA: Diagnosis not present

## 2021-06-19 DIAGNOSIS — M170 Bilateral primary osteoarthritis of knee: Secondary | ICD-10-CM | POA: Diagnosis not present

## 2021-06-19 DIAGNOSIS — E042 Nontoxic multinodular goiter: Secondary | ICD-10-CM | POA: Diagnosis not present

## 2021-06-19 DIAGNOSIS — R221 Localized swelling, mass and lump, neck: Secondary | ICD-10-CM | POA: Diagnosis not present

## 2021-06-19 DIAGNOSIS — M130 Polyarthritis, unspecified: Secondary | ICD-10-CM | POA: Diagnosis not present

## 2021-06-19 DIAGNOSIS — I10 Essential (primary) hypertension: Secondary | ICD-10-CM | POA: Diagnosis not present

## 2021-06-19 DIAGNOSIS — Z6839 Body mass index (BMI) 39.0-39.9, adult: Secondary | ICD-10-CM | POA: Diagnosis not present

## 2021-06-19 DIAGNOSIS — I63533 Cerebral infarction due to unspecified occlusion or stenosis of bilateral posterior cerebral arteries: Secondary | ICD-10-CM | POA: Diagnosis not present

## 2021-08-18 DIAGNOSIS — E049 Nontoxic goiter, unspecified: Secondary | ICD-10-CM | POA: Diagnosis not present

## 2021-08-18 DIAGNOSIS — E042 Nontoxic multinodular goiter: Secondary | ICD-10-CM | POA: Diagnosis not present

## 2021-08-18 DIAGNOSIS — J398 Other specified diseases of upper respiratory tract: Secondary | ICD-10-CM | POA: Diagnosis not present

## 2021-08-19 DIAGNOSIS — E042 Nontoxic multinodular goiter: Secondary | ICD-10-CM | POA: Diagnosis not present

## 2021-08-19 DIAGNOSIS — E049 Nontoxic goiter, unspecified: Secondary | ICD-10-CM | POA: Diagnosis not present

## 2021-08-19 DIAGNOSIS — J398 Other specified diseases of upper respiratory tract: Secondary | ICD-10-CM | POA: Diagnosis not present

## 2021-08-24 ENCOUNTER — Other Ambulatory Visit: Payer: Self-pay | Admitting: Surgery

## 2021-08-26 ENCOUNTER — Other Ambulatory Visit: Payer: Self-pay | Admitting: Surgery

## 2021-08-26 DIAGNOSIS — J398 Other specified diseases of upper respiratory tract: Secondary | ICD-10-CM

## 2021-08-26 DIAGNOSIS — E042 Nontoxic multinodular goiter: Secondary | ICD-10-CM

## 2021-08-26 DIAGNOSIS — E049 Nontoxic goiter, unspecified: Secondary | ICD-10-CM

## 2021-08-27 ENCOUNTER — Ambulatory Visit: Payer: Medicare HMO

## 2021-09-08 DIAGNOSIS — I10 Essential (primary) hypertension: Secondary | ICD-10-CM | POA: Diagnosis not present

## 2021-09-08 DIAGNOSIS — I340 Nonrheumatic mitral (valve) insufficiency: Secondary | ICD-10-CM | POA: Diagnosis not present

## 2021-09-08 DIAGNOSIS — R072 Precordial pain: Secondary | ICD-10-CM | POA: Diagnosis not present

## 2021-09-21 ENCOUNTER — Ambulatory Visit
Admission: RE | Admit: 2021-09-21 | Discharge: 2021-09-21 | Disposition: A | Discharge status: Home / Self Care | Payer: Medicare HMO | Source: Ambulatory Visit | Attending: Surgery | Admitting: Surgery

## 2021-09-21 DIAGNOSIS — J398 Other specified diseases of upper respiratory tract: Secondary | ICD-10-CM | POA: Diagnosis not present

## 2021-09-21 DIAGNOSIS — I2510 Atherosclerotic heart disease of native coronary artery without angina pectoris: Secondary | ICD-10-CM | POA: Diagnosis not present

## 2021-09-21 DIAGNOSIS — I288 Other diseases of pulmonary vessels: Secondary | ICD-10-CM | POA: Diagnosis not present

## 2021-09-21 DIAGNOSIS — E042 Nontoxic multinodular goiter: Secondary | ICD-10-CM

## 2021-09-21 DIAGNOSIS — I700 Atherosclerosis of aorta: Secondary | ICD-10-CM | POA: Diagnosis not present

## 2021-09-21 DIAGNOSIS — Z8673 Personal history of transient ischemic attack (TIA), and cerebral infarction without residual deficits: Secondary | ICD-10-CM | POA: Diagnosis not present

## 2021-09-21 DIAGNOSIS — I6523 Occlusion and stenosis of bilateral carotid arteries: Secondary | ICD-10-CM | POA: Diagnosis not present

## 2021-09-21 DIAGNOSIS — E049 Nontoxic goiter, unspecified: Secondary | ICD-10-CM

## 2021-09-21 MED ORDER — IOPAMIDOL (ISOVUE-370) INJECTION 76%
60.0000 mL | Freq: Once | INTRAVENOUS | Status: AC | PRN
  Administered 2021-09-21: 60 mL via INTRAVENOUS

## 2021-09-30 NOTE — Progress Notes (Signed)
CT scan demonstrates enlarged thyroid gland with tracheal and esophageal deviation.  No worrisome features.  Patient does not wish to pursue surgery at this time.  Will follow up in one year with repeat USN and TSH level followed by physical exam here in office.  [place], MD [facility] [place] practice Office: [phone]

## 2021-10-05 ENCOUNTER — Inpatient Hospital Stay (HOSPITAL_BASED_OUTPATIENT_CLINIC_OR_DEPARTMENT_OTHER)
Admission: EM | Admit: 2021-10-05 | Discharge: 2021-10-09 | DRG: 418 | Disposition: A | Discharge status: Home / Self Care | Payer: Medicare HMO | Attending: Internal Medicine | Admitting: Internal Medicine

## 2021-10-05 ENCOUNTER — Emergency Department (HOSPITAL_BASED_OUTPATIENT_CLINIC_OR_DEPARTMENT_OTHER): Payer: Medicare HMO

## 2021-10-05 ENCOUNTER — Encounter (HOSPITAL_BASED_OUTPATIENT_CLINIC_OR_DEPARTMENT_OTHER): Payer: Self-pay | Admitting: Emergency Medicine

## 2021-10-05 ENCOUNTER — Other Ambulatory Visit: Payer: Self-pay

## 2021-10-05 DIAGNOSIS — R7401 Elevation of levels of liver transaminase levels: Secondary | ICD-10-CM | POA: Diagnosis present

## 2021-10-05 DIAGNOSIS — K851 Biliary acute pancreatitis without necrosis or infection: Principal | ICD-10-CM | POA: Diagnosis present

## 2021-10-05 DIAGNOSIS — R1013 Epigastric pain: Secondary | ICD-10-CM

## 2021-10-05 DIAGNOSIS — Z79899 Other long term (current) drug therapy: Secondary | ICD-10-CM

## 2021-10-05 DIAGNOSIS — R748 Abnormal levels of other serum enzymes: Secondary | ICD-10-CM | POA: Diagnosis present

## 2021-10-05 DIAGNOSIS — E669 Obesity, unspecified: Secondary | ICD-10-CM | POA: Diagnosis not present

## 2021-10-05 DIAGNOSIS — K8020 Calculus of gallbladder without cholecystitis without obstruction: Secondary | ICD-10-CM | POA: Diagnosis not present

## 2021-10-05 DIAGNOSIS — K828 Other specified diseases of gallbladder: Secondary | ICD-10-CM | POA: Diagnosis not present

## 2021-10-05 DIAGNOSIS — R1011 Right upper quadrant pain: Secondary | ICD-10-CM | POA: Diagnosis not present

## 2021-10-05 DIAGNOSIS — E042 Nontoxic multinodular goiter: Secondary | ICD-10-CM | POA: Diagnosis not present

## 2021-10-05 DIAGNOSIS — R935 Abnormal findings on diagnostic imaging of other abdominal regions, including retroperitoneum: Secondary | ICD-10-CM | POA: Diagnosis not present

## 2021-10-05 DIAGNOSIS — R109 Unspecified abdominal pain: Secondary | ICD-10-CM | POA: Diagnosis not present

## 2021-10-05 DIAGNOSIS — F419 Anxiety disorder, unspecified: Secondary | ICD-10-CM | POA: Diagnosis present

## 2021-10-05 DIAGNOSIS — Z8673 Personal history of transient ischemic attack (TIA), and cerebral infarction without residual deficits: Secondary | ICD-10-CM | POA: Diagnosis not present

## 2021-10-05 DIAGNOSIS — I2510 Atherosclerotic heart disease of native coronary artery without angina pectoris: Secondary | ICD-10-CM | POA: Diagnosis present

## 2021-10-05 DIAGNOSIS — I10 Essential (primary) hypertension: Secondary | ICD-10-CM | POA: Diagnosis present

## 2021-10-05 DIAGNOSIS — R739 Hyperglycemia, unspecified: Secondary | ICD-10-CM | POA: Diagnosis not present

## 2021-10-05 DIAGNOSIS — Z6837 Body mass index (BMI) 37.0-37.9, adult: Secondary | ICD-10-CM | POA: Diagnosis not present

## 2021-10-05 DIAGNOSIS — R945 Abnormal results of liver function studies: Secondary | ICD-10-CM | POA: Diagnosis not present

## 2021-10-05 DIAGNOSIS — Z7982 Long term (current) use of aspirin: Secondary | ICD-10-CM

## 2021-10-05 DIAGNOSIS — N179 Acute kidney failure, unspecified: Secondary | ICD-10-CM | POA: Diagnosis present

## 2021-10-05 DIAGNOSIS — R0602 Shortness of breath: Secondary | ICD-10-CM | POA: Diagnosis not present

## 2021-10-05 DIAGNOSIS — E785 Hyperlipidemia, unspecified: Secondary | ICD-10-CM | POA: Diagnosis present

## 2021-10-05 DIAGNOSIS — N289 Disorder of kidney and ureter, unspecified: Secondary | ICD-10-CM | POA: Diagnosis not present

## 2021-10-05 DIAGNOSIS — I425 Other restrictive cardiomyopathy: Secondary | ICD-10-CM | POA: Diagnosis not present

## 2021-10-05 DIAGNOSIS — R112 Nausea with vomiting, unspecified: Secondary | ICD-10-CM | POA: Diagnosis not present

## 2021-10-05 DIAGNOSIS — I340 Nonrheumatic mitral (valve) insufficiency: Secondary | ICD-10-CM | POA: Diagnosis not present

## 2021-10-05 DIAGNOSIS — Z6838 Body mass index (BMI) 38.0-38.9, adult: Secondary | ICD-10-CM | POA: Diagnosis not present

## 2021-10-05 DIAGNOSIS — E049 Nontoxic goiter, unspecified: Secondary | ICD-10-CM | POA: Diagnosis present

## 2021-10-05 DIAGNOSIS — K859 Acute pancreatitis without necrosis or infection, unspecified: Secondary | ICD-10-CM | POA: Diagnosis not present

## 2021-10-05 DIAGNOSIS — K8010 Calculus of gallbladder with chronic cholecystitis without obstruction: Secondary | ICD-10-CM | POA: Diagnosis not present

## 2021-10-05 HISTORY — DX: Localized edema: R60.0

## 2021-10-05 HISTORY — DX: Atherosclerotic heart disease of native coronary artery without angina pectoris: I25.10

## 2021-10-05 HISTORY — DX: Nontoxic multinodular goiter: E04.2

## 2021-10-05 HISTORY — DX: Dyspnea, unspecified: R06.00

## 2021-10-05 LAB — COMPREHENSIVE METABOLIC PANEL
ALT: 612 U/L — ABNORMAL HIGH (ref 0–44)
AST: 1164 U/L — ABNORMAL HIGH (ref 15–41)
Albumin: 3.8 g/dL (ref 3.5–5.0)
Alkaline Phosphatase: 310 U/L — ABNORMAL HIGH (ref 38–126)
Anion gap: 11 (ref 5–15)
BUN: 35 mg/dL — ABNORMAL HIGH (ref 8–23)
CO2: 23 mmol/L (ref 22–32)
Calcium: 9.7 mg/dL (ref 8.9–10.3)
Chloride: 105 mmol/L (ref 98–111)
Creatinine, Ser: 1.85 mg/dL — ABNORMAL HIGH (ref 0.44–1.00)
GFR, Estimated: 29 mL/min — ABNORMAL LOW (ref >60)
Glucose, Bld: 138 mg/dL — ABNORMAL HIGH (ref 70–99)
Potassium: 4.4 mmol/L (ref 3.5–5.1)
Sodium: 139 mmol/L (ref 135–145)
Total Bilirubin: 1.5 mg/dL — ABNORMAL HIGH (ref 0.3–1.2)
Total Protein: 7.6 g/dL (ref 6.5–8.1)

## 2021-10-05 LAB — URINALYSIS, ROUTINE W REFLEX MICROSCOPIC
Bilirubin Urine: NEGATIVE
Glucose, UA: NEGATIVE mg/dL
Ketones, ur: NEGATIVE mg/dL
Nitrite: NEGATIVE
Protein, ur: 30 mg/dL — AB
Specific Gravity, Urine: 1.022 (ref 1.005–1.030)
pH: 5 (ref 5.0–8.0)

## 2021-10-05 LAB — CBC
HCT: 32.5 % — ABNORMAL LOW (ref 36.0–46.0)
Hemoglobin: 10.3 g/dL — ABNORMAL LOW (ref 12.0–15.0)
MCH: 27.2 pg (ref 26.0–34.0)
MCHC: 31.7 g/dL (ref 30.0–36.0)
MCV: 85.8 fL (ref 80.0–100.0)
Platelets: 273 10*3/uL (ref 150–400)
RBC: 3.79 MIL/uL — ABNORMAL LOW (ref 3.87–5.11)
RDW: 14.6 % (ref 11.5–15.5)
WBC: 10.1 10*3/uL (ref 4.0–10.5)
nRBC: 0 % (ref 0.0–0.2)

## 2021-10-05 LAB — TRIGLYCERIDES: Triglycerides: 34 mg/dL (ref <150)

## 2021-10-05 LAB — LIPASE, BLOOD: Lipase: 2651 U/L — ABNORMAL HIGH (ref 11–51)

## 2021-10-05 LAB — TROPONIN I (HIGH SENSITIVITY): Troponin I (High Sensitivity): 10 ng/L (ref <18)

## 2021-10-05 MED ORDER — FENTANYL CITRATE PF 50 MCG/ML IJ SOSY
25.0000 ug | PREFILLED_SYRINGE | INTRAMUSCULAR | Status: DC | PRN
  Filled 2021-10-05: qty 1

## 2021-10-05 MED ORDER — FENTANYL CITRATE PF 50 MCG/ML IJ SOSY
50.0000 ug | PREFILLED_SYRINGE | Freq: Once | INTRAMUSCULAR | Status: AC
  Administered 2021-10-05: 50 ug via INTRAVENOUS
  Filled 2021-10-05: qty 1

## 2021-10-05 MED ORDER — SODIUM CHLORIDE 0.9 % IV BOLUS
1000.0000 mL | Freq: Once | INTRAVENOUS | Status: AC
  Administered 2021-10-05: 1000 mL via INTRAVENOUS

## 2021-10-05 MED ORDER — HYDRALAZINE HCL 20 MG/ML IJ SOLN: 10.0000 mg | Freq: Four times a day (QID) | INTRAMUSCULAR | Status: DC | PRN

## 2021-10-05 MED ORDER — SODIUM CHLORIDE 0.9 % IV BOLUS
500.0000 mL | Freq: Once | INTRAVENOUS | Status: AC
  Administered 2021-10-05: 500 mL via INTRAVENOUS

## 2021-10-05 MED ORDER — ONDANSETRON HCL 4 MG PO TABS: 4.0000 mg | ORAL_TABLET | Freq: Four times a day (QID) | ORAL | Status: DC | PRN

## 2021-10-05 MED ORDER — HYDROMORPHONE HCL 1 MG/ML IJ SOLN
1.0000 mg | INTRAMUSCULAR | Status: DC | PRN
  Administered 2021-10-05 – 2021-10-07 (×11): 1 mg via INTRAVENOUS
  Filled 2021-10-05 (×11): qty 1

## 2021-10-05 MED ORDER — ONDANSETRON HCL 4 MG/2ML IJ SOLN
4.0000 mg | Freq: Four times a day (QID) | INTRAMUSCULAR | Status: DC | PRN
  Administered 2021-10-06: 4 mg via INTRAVENOUS
  Filled 2021-10-05: qty 2

## 2021-10-05 MED ORDER — LACTATED RINGERS IV SOLN: INTRAVENOUS | Status: AC

## 2021-10-05 MED ORDER — HEPARIN SODIUM (PORCINE) 5000 UNIT/ML IJ SOLN
5000.0000 [IU] | Freq: Three times a day (TID) | INTRAMUSCULAR | Status: DC
  Administered 2021-10-05 – 2021-10-09 (×11): 5000 [IU] via SUBCUTANEOUS
  Filled 2021-10-05 (×12): qty 1

## 2021-10-05 NOTE — ED Notes (Signed)
Pt from home with abdominal pain since last night after eating. Pt endorses "a little" emesis and diarrhea. She reports that she has all her internal organs. Pt pale; last time she ate was 9am, was unable to keep it down. Pt able to keep some fluids down.

## 2021-10-05 NOTE — Assessment & Plan Note (Signed)
Creatinine 1.85 on admission compared to prior 1.09 in July 2019.  Likely secondary to acute pancreatitis. -Continue IV fluid hydration and repeat labs in a.m.

## 2021-10-05 NOTE — Hospital Course (Signed)
Susan Nichols is a 71 y.o. female with medical history significant for history of CVA, HTN, HLD, multinodular goiter who is admitted with acute pancreatitis associated with transaminitis.  [facility] ED Course  Labs/Imaging on admission: I have personally reviewed following labs and imaging studies.   Initial vitals showed BP 143/57, pulse 70, RR 18, temp 97.0 F, SPO2 100% on room air.   Labs show lipase 2651, AST 1164, ALT 612, alk phos 310, T. bili 1.5, BUN 35, creatinine 1.85, serum glucose 138, sodium 139, potassium 4.4, bicarb 23, WBC 10.1, hemoglobin 10.3, platelets 273,000.  Urinalysis negative for UTI.   CT abdomen/pelvis without contrast negative for radiographic signs of acute pancreatitis or other acute findings.  Cholelithiasis without evidence of cholecystitis or biliary ductal dilatation noted.   RUQ ultrasound negative for acute findings.  Cholelithiasis again noted without evidence of cholecystitis.  CBD diameter 3 mm.  MRCP also positive for cholelithiasis, no cholecystitis.  No choledocholithiasis or evidence of suggested biliary tract obstruction.Mild T2 hyperintensity adjacent to the pancreatic head. This is nonspecific, but could suggest acute inflammation. Correlation with lipase levels is recommended to exclude possible acute pancreatitis. Intrahepatic shunt with apparent communication between a branch of the right portal vein and the middle hepatic vein, as above.  5/30: Some improvement in AST and ALT, worsening of alkaline phosphatase to 371 and T. bili to 3.6.  Slight improvement of creatinine at 1.70, baseline around 1.3. GI consulted general surgery for laparoscopic cholecystectomy, general surgery wants cardiology clearance based on her recent CTA which shows aortic atherosclerosis.  Message sent to cardiology. Started on clear liquid and we will advance as tolerated.

## 2021-10-05 NOTE — Assessment & Plan Note (Signed)
Holding aspirin while NPO.  - Statin also on hold with elevated LFTs.

## 2021-10-05 NOTE — Progress Notes (Signed)
Bilateral feet nail fungus. Long curled toe nails

## 2021-10-05 NOTE — Assessment & Plan Note (Addendum)
Transaminitis Lipase >2600 with AST 1164, ALT 612, alk phos 310, T. bili 1.5.  No prior history of pancreatitis.  CT A/P and RUQ U/S showed cholelithiasis without evidence of acute cholecystitis or CBD dilatation.  Patient reports symptoms began after eating ribs from a restaurant. -Keep n.p.o. tonight -Continue IV fluid hydration overnight -IV antiemetics and analgesics as needed -Follow triglyceride levels -Obtain MRCP

## 2021-10-05 NOTE — Assessment & Plan Note (Signed)
Holding home meds while NPO.  IV hydralazine as needed.

## 2021-10-05 NOTE — ED Provider Notes (Signed)
[facility] EMERGENCY DEPT Provider Note   CSN: [id number] Arrival date & time: 10/05/21  1307     History  Chief Complaint  Patient presents with   Abdominal Pain    Susan Nichols is a 71 y.o. female who presents to the ED with a PMHx of hypertension who presents to the emergency department complaining of upper abdominal pain onset last night.  Patient notes she had food from [facility] prior to the onset of her symptoms.  Denies sick contacts.  Still has her gallbladder and appendix.  Had associated emesis this morning, chills, lightheadedness.  No prior history of GERD, EtOH use.  Patient endorses using 1 g of Tylenol twice daily.  Denies fever, dysuria, hematuria, dizziness, nausea. Denies past medical history of diabetes.    The history is provided by the patient. No language interpreter was used.      Home Medications Prior to Admission medications   Medication Sig Start Date End Date Taking? Authorizing Provider  acetaminophen (TYLENOL) 325 MG tablet Take 2 tablets (650 mg total) by mouth every 6 (six) hours as needed for mild pain or fever. 11/26/17   Roxan Hockey, MD  ALPRAZolam Duanne Moron) 0.25 MG tablet Take 1 tablet (0.25 mg total) by mouth daily as needed for anxiety. 11/26/17   Roxan Hockey, MD  amLODipine (NORVASC) 10 MG tablet Take 1 tablet (10 mg total) by mouth daily. 11/26/17   Roxan Hockey, MD  aspirin 81 MG tablet Take 1 tablet (81 mg total) by mouth daily. 03/29/18   Frann Rider, NP  atorvastatin (LIPITOR) 40 MG tablet Take 1 tablet (40 mg total) by mouth daily. 11/26/17   Roxan Hockey, MD  chlorthalidone (HYGROTON) 25 MG tablet Take 1 tablet (25 mg total) by mouth daily. 11/26/17   Roxan Hockey, MD  cloNIDine (CATAPRES) 0.2 MG tablet Take 1 tablet (0.2 mg total) by mouth 2 (two) times daily. 11/26/17 11/26/18  Roxan Hockey, MD  hydrALAZINE (APRESOLINE) 50 MG tablet Take 1 tablet (50 mg total) by mouth 2 (two) times daily. 11/26/17    Roxan Hockey, MD  HYDROcodone-acetaminophen (NORCO/VICODIN) 5-325 MG tablet Take 1 tablet by mouth at bedtime as needed for moderate pain. 11/26/17   Roxan Hockey, MD  isosorbide mononitrate (IMDUR) 60 MG 24 hr tablet Take 1 tablet (60 mg total) by mouth daily. 11/26/17   Roxan Hockey, MD  lisinopril (PRINIVIL,ZESTRIL) 20 MG tablet Take 1 tablet (20 mg total) by mouth daily. 11/26/17   Roxan Hockey, MD  metoprolol tartrate (LOPRESSOR) 50 MG tablet Take 1 tablet (50 mg total) by mouth 2 (two) times daily. 11/26/17 11/26/18  Roxan Hockey, MD  potassium chloride (MICRO-K) 10 MEQ CR capsule Take 1 capsule (10 mEq total) by mouth daily. 11/26/17   Roxan Hockey, MD      Allergies    Patient has no known allergies.    Review of Systems   Review of Systems  Constitutional:  Positive for chills. Negative for fever.  Gastrointestinal:  Positive for abdominal pain and vomiting. Negative for nausea.  Genitourinary:  Negative for dysuria and hematuria.  Neurological:  Positive for light-headedness. Negative for dizziness.  All other systems reviewed and are negative.  Physical Exam Updated Vital Signs BP (!) 143/57 (BP Location: Left Arm)   Pulse 70   Temp (!) 97 F (36.1 C) (Temporal)   Resp 18   Ht '5\' 4"'$  (1.626 m)   Wt 98.9 kg   SpO2 100%   BMI 37.42 kg/m  Physical Exam Vitals  and nursing note reviewed.  Constitutional:      General: She is not in acute distress.    Appearance: She is not diaphoretic.  HENT:     Head: Normocephalic and atraumatic.     Mouth/Throat:     Pharynx: No oropharyngeal exudate.  Eyes:     General: No scleral icterus.    Conjunctiva/sclera: Conjunctivae normal.  Cardiovascular:     Rate and Rhythm: Normal rate and regular rhythm.     Pulses: Normal pulses.     Heart sounds: Normal heart sounds.  Pulmonary:     Effort: Pulmonary effort is normal. No respiratory distress.     Breath sounds: Normal breath sounds. No wheezing.  Abdominal:      General: Bowel sounds are normal.     Palpations: Abdomen is soft. There is no mass.     Tenderness: There is abdominal tenderness in the epigastric area. There is no guarding or rebound.     Comments: Tenderness to palpation noted to epigastric region.  Musculoskeletal:        General: Normal range of motion.     Cervical back: Normal range of motion and neck supple.  Skin:    General: Skin is warm and dry.  Neurological:     Mental Status: She is alert.  Psychiatric:        Behavior: Behavior normal.    ED Results / Procedures / Treatments   Labs (all labs ordered are listed, but only abnormal results are displayed) Labs Reviewed  LIPASE, BLOOD - Abnormal; Notable for the following components:      Result Value   Lipase 2,651 (*)    All other components within normal limits  COMPREHENSIVE METABOLIC PANEL - Abnormal; Notable for the following components:   Glucose, Bld 138 (*)    BUN 35 (*)    Creatinine, Ser 1.85 (*)    AST 1,164 (*)    ALT 612 (*)    Alkaline Phosphatase 310 (*)    Total Bilirubin 1.5 (*)    GFR, Estimated 29 (*)    All other components within normal limits  CBC - Abnormal; Notable for the following components:   RBC 3.79 (*)    Hemoglobin 10.3 (*)    HCT 32.5 (*)    All other components within normal limits  URINALYSIS, ROUTINE W REFLEX MICROSCOPIC    EKG None  Radiology No results found.  Procedures Procedures    Medications Ordered in ED Medications - No data to display  ED Course/ Medical Decision Making/ A&P Clinical Course as of 10/06/21 0002  Mon Oct 05, 2021  1813 Re-evaluated and discussed with patient and family regarding imaging findings. Discussed admission plans with patient. Pt agreeable at this time. [SB]  1836 Consult with gastroenterologist, Dr. Paulita Fujita who agrees with admission at this time and will evaluate patient tomorrow. [SB]  1915 Consult with hospitalist, Dr. Hal Hope who agrees with admission at [facility]  at this time.  Also recommends IV fluids [SB]    Clinical Course User Index [SB] Presleigh Feldstein A, PA-C                           Medical Decision Making Amount and/or Complexity of Data Reviewed Labs: ordered. Radiology: ordered.  Risk Prescription drug management. Decision regarding hospitalization.   Patient presents to the emergency department with upper abdominal pain onset last night.  Denies sick contacts. Patient has a history of diabetes.  Denies excessive EtOH, Tylenol, history of GERD.  On exam patient with upper abdominal tenderness to palpation.  Remainder of exam without acute findings.  Vital signs stable, patient afebrile. Differential diagnosis includes pancreatitis, cholecystitis, choledocholithiasis, cholelithiasis, GERD.     Labs:  I ordered, and personally interpreted labs.  The pertinent results include:   Lipase elevated at 2651. CBC without leukocytosis. Urinalysis without concerns for UTI. CMP with slightly elevated glucose at 138, creatinine elevated 1.85, BUN elevated 35, elevated LFTs (AST elevated at 1164, ALT elevated 61, alkaline phosphatase elevated at 310), total bili elevated at 1.5, GFR decreased to 29.  No previous values to compare to.  Imaging: I ordered imaging studies including CT abdomen pelvis, right upper quadrant ultrasound I independently visualized and interpreted imaging which showed RUQ [place]:  1. No acute findings.  2. Cholelithiasis without evidence of acute cholecystitis.   CT AP:  No radiographic signs of acute pancreatitis or other acute findings.     Cholelithiasis. No radiographic evidence of cholecystitis.     Small hiatal hernia.   I agree with the radiologist interpretation  Medications:  I ordered medication including fentanyl, IV fluids for symptom management. Reevaluation of the patient after these medicines and interventions, I reevaluated the patient and found that they have improved I have reviewed the patients  home medicines and have made adjustments as needed   Consultations: I requested consultation with the Gastroenterologist, Dr. Paulita Fujita and discussed lab and imaging findings as well as pertinent plan - they recommend: Admission to the hospital and will evaluate patient tomorrow. -I requested consultation with the hospitalist, Dr. Hal Hope and discussed lab and imaging findings as well as pertinent plan-they recommend admission to the hospital.   Disposition: Presentation suspicious for gallbladder pancreatitis.  Doubt cholecystitis at this time.  CT and ultrasound notable for cholelithiasis.  Doubt GERD. After consideration of the diagnostic results and the patients response to treatment, I feel that the patient would benefit from Admission to the hospital.  Discussed with patient admission plans, patient agreeable this time.  Patient for safe for admission.   This chart was dictated using voice recognition software, Dragon. Despite the best efforts of this provider to proofread and correct errors, errors may still occur which can change documentation meaning.  Final Clinical Impression(s) / ED Diagnoses Final diagnoses:  Elevated liver enzymes  Elevated lipase  Abdominal pain, epigastric    Rx / DC Orders ED Discharge Orders     None         Penelope Fittro A, PA-C 84/66/59 [id number]    Lianne Cure, DO 01/77/93 2325

## 2021-10-05 NOTE — Assessment & Plan Note (Signed)
Holding statin 

## 2021-10-05 NOTE — ED Triage Notes (Signed)
Pt started with abd pain after eating ribs last night. Pt took pepto for discomfort with no relief. Pt had diarrhea and vomiting once this morning around 9am. Pain is in middle of abd worse with breathing.

## 2021-10-05 NOTE — Assessment & Plan Note (Signed)
Patient has known large multinodular goiter with tracheal and esophageal deviation.  Recently evaluated by general surgery, Dr. Harlow Asa, without worrisome features.

## 2021-10-05 NOTE — ED Notes (Signed)
[facility] arrived to transport pt. Pt stable at time of transfer

## 2021-10-05 NOTE — H&P (Signed)
History and Physical    Susan Nichols QJF:[id number] DOB: [birth year] DOA: 10/05/2021  PCP: Lucianne Lei, MD  Patient coming from: Home  I have personally briefly reviewed patient's old medical records in [place]  Chief Complaint: Abdominal pain with nausea and vomiting  HPI: Susan Nichols is a 71 y.o. female with medical history significant for history of CVA, HTN, HLD, multinodular goiter who presented to the ED for evaluation of abdominal pain associated with nausea and vomiting.  Patient states she was in her usual state of health until yesterday, 5/28.  In the afternoon she ate ribs from [place].  Afterwards she developed severe epigastric abdominal pain.  She had associated nausea with small amount of emesis and small volume diarrhea.  Pain did not radiate to her back.  Symptoms have persisted since yesterday.  She tried Pepto-Bismol and Alka-Seltzer without relief.  She denies any similar episodes in the past.  Due to uncontrolled pain she came to the ED for further evaluation and management.  [place] ED Course  Labs/Imaging on admission: I have personally reviewed following labs and imaging studies.  Initial vitals showed BP 143/57, pulse 70, RR 18, temp 97.0 F, SPO2 100% on room air.  Labs show lipase 2651, AST 1164, ALT 612, alk phos 310, T. bili 1.5, BUN 35, creatinine 1.85, serum glucose 138, sodium 139, potassium 4.4, bicarb 23, WBC 10.1, hemoglobin 10.3, platelets 273,000.  Urinalysis negative for UTI.  CT abdomen/pelvis without contrast negative for radiographic signs of acute pancreatitis or other acute findings.  Cholelithiasis without evidence of cholecystitis or biliary ductal dilatation noted.  RUQ ultrasound negative for acute findings.  Cholelithiasis again noted without evidence of cholecystitis.  CBD diameter 3 mm.  EDP discussed with on-call GI who will see in consultation.  Patient was given 1.5 L normal saline.  The  hospitalist service was consulted to admit for further evaluation and management.  Review of Systems: All systems reviewed and are negative except as documented in history of present illness above.   Past Medical History:  Diagnosis Date   Hyperlipemia    Hypertension    Stroke [facility])     Past Surgical History:  Procedure Laterality Date   TOTAL KNEE ARTHROPLASTY  2007   right knee    Social History:  reports that she has never smoked. She has never used smokeless tobacco. She reports that she does not drink alcohol and does not use drugs.  No Known Allergies  Family History  Problem Relation Age of Onset   Stroke Mother    Hypertension Mother    Hyperlipidemia Mother    Hypertension Father    Hyperlipidemia Father    Stroke Brother      Prior to Admission medications   Medication Sig Start Date End Date Taking? Authorizing Provider  acetaminophen (TYLENOL) 325 MG tablet Take 2 tablets (650 mg total) by mouth every 6 (six) hours as needed for mild pain or fever. 11/26/17   Roxan Hockey, MD  ALPRAZolam Duanne Moron) 0.25 MG tablet Take 1 tablet (0.25 mg total) by mouth daily as needed for anxiety. 11/26/17   Roxan Hockey, MD  amLODipine (NORVASC) 10 MG tablet Take 1 tablet (10 mg total) by mouth daily. 11/26/17   Roxan Hockey, MD  aspirin 81 MG tablet Take 1 tablet (81 mg total) by mouth daily. 03/29/18   Frann Rider, NP  atorvastatin (LIPITOR) 40 MG tablet Take 1 tablet (40 mg total) by mouth daily. 11/26/17   Emokpae,  Courage, MD  chlorthalidone (HYGROTON) 25 MG tablet Take 1 tablet (25 mg total) by mouth daily. 11/26/17   Roxan Hockey, MD  cloNIDine (CATAPRES) 0.2 MG tablet Take 1 tablet (0.2 mg total) by mouth 2 (two) times daily. 11/26/17 11/26/18  Roxan Hockey, MD  hydrALAZINE (APRESOLINE) 50 MG tablet Take 1 tablet (50 mg total) by mouth 2 (two) times daily. 11/26/17   Roxan Hockey, MD  HYDROcodone-acetaminophen (NORCO/VICODIN) 5-325 MG tablet Take 1  tablet by mouth at bedtime as needed for moderate pain. 11/26/17   Roxan Hockey, MD  isosorbide mononitrate (IMDUR) 60 MG 24 hr tablet Take 1 tablet (60 mg total) by mouth daily. 11/26/17   Roxan Hockey, MD  lisinopril (PRINIVIL,ZESTRIL) 20 MG tablet Take 1 tablet (20 mg total) by mouth daily. 11/26/17   Roxan Hockey, MD  metoprolol tartrate (LOPRESSOR) 50 MG tablet Take 1 tablet (50 mg total) by mouth 2 (two) times daily. 11/26/17 11/26/18  Roxan Hockey, MD  potassium chloride (MICRO-K) 10 MEQ CR capsule Take 1 capsule (10 mEq total) by mouth daily. 11/26/17   Roxan Hockey, MD    Physical Exam: Vitals:   10/05/21 2000 10/05/21 2103 10/05/21 2104 10/05/21 2105  BP: (!) 159/54 (!) 168/54  (!) 154/58  Pulse: 93 (!) 102  96  Resp: 18 20    Temp: 98.3 F (36.8 C) 98.6 F (37 C)    TempSrc: Oral Oral    SpO2: 94% 96%    Weight:   99.1 kg   Height:   '5\' 4"'$  (1.626 m)    Constitutional: Obese woman resting supine in bed, NAD, calm, comfortable Eyes: EOMI, lids and conjunctivae normal ENMT: Mucous membranes are moist. Posterior pharynx clear of any exudate or lesions.Normal dentition.  Neck: Large thyroid goiter Respiratory: clear to auscultation bilaterally, no wheezing, no crackles. Normal respiratory effort. No accessory muscle use.  Cardiovascular: Regular rate and rhythm, no murmurs / rubs / gallops. No extremity edema. 2+ pedal pulses. Abdomen: Epigastric tenderness, no masses palpated. No hepatosplenomegaly. Musculoskeletal: no clubbing / cyanosis. No joint deformity upper and lower extremities. Good ROM, no contractures. Normal muscle tone.  Skin: no rashes, lesions, ulcers. No induration Neurologic: Sensation intact. Strength 5/5 in all 4.  Psychiatric: Normal judgment and insight. Alert and oriented x 3. Normal mood.   EKG: Not performed.  Assessment/Plan Principal Problem:   Acute pancreatitis Active Problems:   Transaminitis   AKI (acute kidney injury) ([place])    Hyperlipemia   Hypertension   Thyroid goiter   History of CVA (cerebrovascular accident)   Susan Nichols is a 71 y.o. female with medical history significant for history of CVA, HTN, HLD, multinodular goiter who is admitted with acute pancreatitis associated with transaminitis.  Assessment and Plan: * Acute pancreatitis Transaminitis Lipase >2600 with AST 1164, ALT 612, alk phos 310, T. bili 1.5.  No prior history of pancreatitis.  CT A/P and RUQ U/S showed cholelithiasis without evidence of acute cholecystitis or CBD dilatation.  Patient reports symptoms began after eating ribs from a restaurant. -Keep n.p.o. tonight -Continue IV fluid hydration overnight -IV antiemetics and analgesics as needed -Follow triglyceride levels -Obtain MRCP  AKI (acute kidney injury) ([place]) Creatinine 1.85 on admission compared to prior 1.09 in July 2019.  Likely secondary to acute pancreatitis. -Continue IV fluid hydration and repeat labs in a.m.  History of CVA (cerebrovascular accident) Holding aspirin while NPO.  Statin also on hold with elevated LFTs.  Thyroid goiter Patient has known large multinodular goiter with  tracheal and esophageal deviation.  Recently evaluated by general surgery, Dr. Harlow Asa, without worrisome features.  Hypertension Holding home meds while NPO.  IV hydralazine as needed.  Hyperlipemia Holding statin.  DVT prophylaxis: heparin injection 5,000 Units Start: 10/05/21 2245 Code Status: Full code, confirmed on admission Family Communication: Discussed with patient, she has discussed with family Disposition Plan: From home and likely discharge to home pending clinical progress. Consults called: GI Severity of Illness: The appropriate patient status for this patient is INPATIENT. Inpatient status is judged to be reasonable and necessary in order to provide the required intensity of service to ensure the patient's safety. The patient's presenting symptoms, physical exam  findings, and initial radiographic and laboratory data in the context of their chronic comorbidities is felt to place them at high risk for further clinical deterioration. Furthermore, it is not anticipated that the patient will be medically stable for discharge from the hospital within 2 midnights of admission.   * I certify that at the point of admission it is my clinical judgment that the patient will require inpatient hospital care spanning beyond 2 midnights from the point of admission due to high intensity of service, high risk for further deterioration and high frequency of surveillance required.Zada Finders MD [facility]  If 7PM-7AM, please contact night-coverage [email or web address]  10/05/2021, 10:05 PM

## 2021-10-06 ENCOUNTER — Encounter (HOSPITAL_COMMUNITY): Payer: Self-pay | Admitting: Internal Medicine

## 2021-10-06 ENCOUNTER — Inpatient Hospital Stay (HOSPITAL_COMMUNITY): Payer: Medicare HMO

## 2021-10-06 DIAGNOSIS — R748 Abnormal levels of other serum enzymes: Secondary | ICD-10-CM | POA: Diagnosis not present

## 2021-10-06 DIAGNOSIS — N179 Acute kidney failure, unspecified: Secondary | ICD-10-CM | POA: Diagnosis not present

## 2021-10-06 DIAGNOSIS — I10 Essential (primary) hypertension: Secondary | ICD-10-CM | POA: Diagnosis not present

## 2021-10-06 DIAGNOSIS — K859 Acute pancreatitis without necrosis or infection, unspecified: Secondary | ICD-10-CM | POA: Diagnosis not present

## 2021-10-06 DIAGNOSIS — R7401 Elevation of levels of liver transaminase levels: Secondary | ICD-10-CM

## 2021-10-06 LAB — CBC
HCT: 29 % — ABNORMAL LOW (ref 36.0–46.0)
Hemoglobin: 9.1 g/dL — ABNORMAL LOW (ref 12.0–15.0)
MCH: 27.4 pg (ref 26.0–34.0)
MCHC: 31.4 g/dL (ref 30.0–36.0)
MCV: 87.3 fL (ref 80.0–100.0)
Platelets: 223 10*3/uL (ref 150–400)
RBC: 3.32 MIL/uL — ABNORMAL LOW (ref 3.87–5.11)
RDW: 15.1 % (ref 11.5–15.5)
WBC: 9.2 10*3/uL (ref 4.0–10.5)
nRBC: 0 % (ref 0.0–0.2)

## 2021-10-06 LAB — COMPREHENSIVE METABOLIC PANEL
ALT: 581 U/L — ABNORMAL HIGH (ref 0–44)
AST: 751 U/L — ABNORMAL HIGH (ref 15–41)
Albumin: 3.4 g/dL — ABNORMAL LOW (ref 3.5–5.0)
Alkaline Phosphatase: 371 U/L — ABNORMAL HIGH (ref 38–126)
Anion gap: 6 (ref 5–15)
BUN: 34 mg/dL — ABNORMAL HIGH (ref 8–23)
CO2: 23 mmol/L (ref 22–32)
Calcium: 9.1 mg/dL (ref 8.9–10.3)
Chloride: 111 mmol/L (ref 98–111)
Creatinine, Ser: 1.7 mg/dL — ABNORMAL HIGH (ref 0.44–1.00)
GFR, Estimated: 32 mL/min — ABNORMAL LOW (ref >60)
Glucose, Bld: 109 mg/dL — ABNORMAL HIGH (ref 70–99)
Potassium: 4.6 mmol/L (ref 3.5–5.1)
Sodium: 140 mmol/L (ref 135–145)
Total Bilirubin: 3.6 mg/dL — ABNORMAL HIGH (ref 0.3–1.2)
Total Protein: 6.9 g/dL (ref 6.5–8.1)

## 2021-10-06 LAB — MAGNESIUM: Magnesium: 2.2 mg/dL (ref 1.7–2.4)

## 2021-10-06 LAB — HEPATITIS PANEL, ACUTE
HCV Ab: NONREACTIVE
Hep A IgM: NONREACTIVE
Hep B C IgM: NONREACTIVE
Hepatitis B Surface Ag: NONREACTIVE

## 2021-10-06 LAB — PROTIME-INR
INR: 1.1 (ref 0.8–1.2)
Prothrombin Time: 13.8 seconds (ref 11.4–15.2)

## 2021-10-06 MED ORDER — ACETAMINOPHEN 500 MG PO TABS
500.0000 mg | ORAL_TABLET | Freq: Once | ORAL | Status: AC
  Administered 2021-10-06: 500 mg via ORAL
  Filled 2021-10-06: qty 1

## 2021-10-06 MED ORDER — METOPROLOL TARTRATE 50 MG PO TABS
50.0000 mg | ORAL_TABLET | Freq: Two times a day (BID) | ORAL | Status: DC
  Administered 2021-10-06 – 2021-10-09 (×7): 50 mg via ORAL
  Filled 2021-10-06 (×7): qty 1

## 2021-10-06 MED ORDER — HYDRALAZINE HCL 50 MG PO TABS
50.0000 mg | ORAL_TABLET | Freq: Two times a day (BID) | ORAL | Status: DC
  Administered 2021-10-06 – 2021-10-09 (×7): 50 mg via ORAL
  Filled 2021-10-06 (×7): qty 1

## 2021-10-06 MED ORDER — DAPAGLIFLOZIN PROPANEDIOL 10 MG PO TABS
10.0000 mg | ORAL_TABLET | Freq: Every day | ORAL | Status: DC
Start: 2021-10-06 — End: 2021-10-06

## 2021-10-06 MED ORDER — AMLODIPINE BESYLATE 10 MG PO TABS
10.0000 mg | ORAL_TABLET | Freq: Every day | ORAL | Status: DC
  Administered 2021-10-06 – 2021-10-09 (×4): 10 mg via ORAL
  Filled 2021-10-06 (×4): qty 1

## 2021-10-06 MED ORDER — CLONIDINE HCL 0.2 MG PO TABS
0.2000 mg | ORAL_TABLET | Freq: Two times a day (BID) | ORAL | Status: DC
  Administered 2021-10-06 – 2021-10-09 (×6): 0.2 mg via ORAL
  Filled 2021-10-06 (×7): qty 1

## 2021-10-06 MED ORDER — ISOSORBIDE MONONITRATE ER 60 MG PO TB24
60.0000 mg | ORAL_TABLET | Freq: Every day | ORAL | Status: DC
  Administered 2021-10-06 – 2021-10-09 (×3): 60 mg via ORAL
  Filled 2021-10-06 (×3): qty 1

## 2021-10-06 NOTE — Assessment & Plan Note (Signed)
Most likely secondary to above.

## 2021-10-06 NOTE — Progress Notes (Signed)
Progress Note   Patient: Susan Nichols EHU:[id number] DOB: 21-Jan-1951 DOA: 10/05/2021     1 DOS: the patient was seen and examined on 10/06/2021   Brief hospital course: Susan Nichols is a 71 y.o. female with medical history significant for history of CVA, HTN, HLD, multinodular goiter who is admitted with acute pancreatitis associated with transaminitis.  Susan Nichols ED Course  Labs/Imaging on admission: I have personally reviewed following labs and imaging studies.   Initial vitals showed BP 143/57, pulse 70, RR 18, temp 97.0 F, SPO2 100% on room air.   Labs show lipase 2651, AST 1164, ALT 612, alk phos 310, T. bili 1.5, BUN 35, creatinine 1.85, serum glucose 138, sodium 139, potassium 4.4, bicarb 23, WBC 10.1, hemoglobin 10.3, platelets 273,000.  Urinalysis negative for UTI.   CT abdomen/pelvis without contrast negative for radiographic signs of acute pancreatitis or other acute findings.  Cholelithiasis without evidence of cholecystitis or biliary ductal dilatation noted.   RUQ ultrasound negative for acute findings.  Cholelithiasis again noted without evidence of cholecystitis.  CBD diameter 3 mm.  MRCP also positive for cholelithiasis, no cholecystitis.  No choledocholithiasis or evidence of suggested biliary tract obstruction.Mild T2 hyperintensity adjacent to the pancreatic head. This is nonspecific, but could suggest acute inflammation. Correlation with lipase levels is recommended to exclude possible acute pancreatitis. Intrahepatic shunt with apparent communication between a branch of the right portal vein and the middle hepatic vein, as above.  5/30: Some improvement in AST and ALT, worsening of alkaline phosphatase to 371 and T. bili to 3.6.  Slight improvement of creatinine at 1.70, baseline around 1.3. GI consulted general surgery for laparoscopic cholecystectomy, general surgery wants cardiology clearance based on her recent CTA which shows aortic atherosclerosis.   Message sent to cardiology. Started on clear liquid and we will advance as tolerated.   Assessment and Plan: * Acute pancreatitis Transaminitis Lipase >2600 with AST 1164, ALT 612, alk phos 310, T. bili 1.5.  No prior history of pancreatitis.  CT A/P and RUQ U/S showed cholelithiasis without evidence of acute cholecystitis or CBD dilatation.  MRCP with concern of acute pancreatitis.  Patient reports symptoms began after eating ribs from a restaurant. General surgery was consulted, patient will most likely scopic cholecystectomy.  Surgery is asking for cardiology clearance. -Start him on clear liquid-as tolerated -IV antiemetics and analgesics as needed -Cardiology consulted for clearance  Transaminitis Most likely secondary to above.  AKI (acute kidney injury) ([place]) Creatinine 1.85 on admission compared to prior 1.09 in July 2019.  Likely secondary to acute pancreatitis.  Some improvement 1.7 today -Continue IV fluid hydration and repeat labs in a.m.  Hypertension Blood pressure mildly elevated, home meds were initially held as he was n.p.o. -Restart home amlodipine, clonidine and metoprolol -Keep holding lisinopril and chlorthalidone due to AKI -IV hydralazine as needed.  Thyroid goiter Patient has known large multinodular goiter with tracheal and esophageal deviation.  Recently evaluated by general surgery, Dr. Harlow Asa, without worrisome features.  History of CVA (cerebrovascular accident) Holding aspirin while NPO.  - Statin also on hold with elevated LFTs.  Hyperlipemia Holding statin.   Subjective: Patient was seen and examined today.  Denies any more nausea or vomiting, belly pain seems improving but continues to have some dull ache.   Physical Exam: Vitals:   10/06/21 0455 10/06/21 0721 10/06/21 0959 10/06/21 1358  BP:  (!) 148/68 (!) 152/55 (!) 134/47  Pulse:  (!) 106 98 (!) 109  Resp:  20 16 16  Temp:  99.7 F (37.6 C) 99.4 F (37.4 C) 99.3 F (37.4 C)   TempSrc:  Oral Oral Oral  SpO2:  99% 91% 91%  Weight: 99.1 kg     Height:       General.     In no acute distress. Pulmonary.  Lungs clear bilaterally, normal respiratory effort. CV.  Regular rate and rhythm, no JVD, rub or murmur. Abdomen.  Soft, nontender, nondistended, BS positive. CNS.  Alert and oriented .  No focal neurologic deficit. Extremities.  No edema, no cyanosis, pulses intact and symmetrical. Psychiatry.  Judgment and insight appears normal.  Data Reviewed: Notes, labs and images reviewed  Family Communication:   Disposition: Status is: Inpatient Remains inpatient appropriate because: Severity of illness   Planned Discharge Destination: Home  Time spent: 45 minutes  This record has been created using Systems analyst. Errors have been sought and corrected,but may not always be located. Such creation errors do not reflect on the standard of care.  Author: Lorella Nimrod, MD 10/06/2021 2:34 PM  For on call review [email or web address].

## 2021-10-06 NOTE — Progress Notes (Signed)
Anesthesia Eval: Called for evaluation as [name] will likely be scheduled for cholecystectomy during this admission. I evaluated her prior records that were available and CT scans, etc, and evaluated her at bedside. We will look for the cardiology evaluation as well. Once her surgery is scheduled, she will re-evaluated by one of my partners and the official plan will be formulated by who will care for her intraop. I spoke to the patient about the particular risks associated with her goiter and it's compression/distortion of her airway. I explained that, at the moment, that was the most concerning issue. But, given her multiple comorbidities, multidisciplinary involvement is wise. We will follow and await the results of her further workup.  Susan Nichols

## 2021-10-06 NOTE — Consult Note (Signed)
Referring Physician: Lorella Nimrod, MD  Susan Nichols is an 71 y.o. female.                       Chief Complaint: Epigastric pain  HPI: 71 years old female with PMH of HTN, HLD, CVA, multinodular goiter, Morbid obesity, dyspnea on exertion, bilateral lower extremities edema had epigastric pain post greasy food intake. She had Lipase over 2651 and elevated LFTs. She has gallstones without evidence of choledocholithiasis. She is awaiting lap chole.  Past Medical History:  Diagnosis Date   Coronary artery disease    Dyspnea    Hyperlipemia    Hypertension    Lower extremity edema    Multinodular goiter    causing tracheal deviation   Stroke [facility])       Past Surgical History:  Procedure Laterality Date   TOTAL KNEE ARTHROPLASTY  05/10/2005   right knee   TUBAL LIGATION      Family History  Problem Relation Age of Onset   Stroke Mother    Hypertension Mother    Hyperlipidemia Mother    Hypertension Father    Hyperlipidemia Father    Stroke Brother    Social History:  reports that she has never smoked. She has never used smokeless tobacco. She reports that she does not drink alcohol and does not use drugs.  Allergies: No Known Allergies  Medications Prior to Admission  Medication Sig Dispense Refill   amLODipine (NORVASC) 10 MG tablet Take 1 tablet (10 mg total) by mouth daily. (Patient taking differently: Take 10 mg by mouth every evening.) 30 tablet 3   aspirin 81 MG tablet Take 1 tablet (81 mg total) by mouth daily. 30 tablet 0   atorvastatin (LIPITOR) 40 MG tablet Take 1 tablet (40 mg total) by mouth daily. (Patient taking differently: Take 40 mg by mouth every evening.) 30 tablet 3   chlorthalidone (HYGROTON) 25 MG tablet Take 1 tablet (25 mg total) by mouth daily. 30 tablet 5   cholecalciferol (VITAMIN D3) 25 MCG (1000 UNIT) tablet Take 1,000 Units by mouth daily.     cloNIDine (CATAPRES) 0.2 MG tablet Take 0.2 mg by mouth 2 (two) times daily.      diphenhydramine-acetaminophen (TYLENOL PM) 25-500 MG TABS tablet Take 1 tablet by mouth in the morning and at bedtime.     FARXIGA 10 MG TABS tablet Take 10 mg by mouth daily.     hydrALAZINE (APRESOLINE) 50 MG tablet Take 1 tablet (50 mg total) by mouth 2 (two) times daily. 60 tablet 5   lisinopril (PRINIVIL,ZESTRIL) 20 MG tablet Take 1 tablet (20 mg total) by mouth daily. (Patient taking differently: Take 20 mg by mouth every evening.) 30 tablet 6   Magnesium 400 MG TABS Take 1 tablet by mouth daily.     metoprolol tartrate (LOPRESSOR) 50 MG tablet Take 1 tablet (50 mg total) by mouth 2 (two) times daily. 60 tablet 3   potassium chloride (KLOR-CON) 10 MEQ tablet Take 10 mEq by mouth daily.     isosorbide mononitrate (IMDUR) 60 MG 24 hr tablet Take 1 tablet (60 mg total) by mouth daily. (Patient not taking: Reported on 10/05/2021) 30 tablet 5    Results for orders placed or performed during the hospital encounter of 10/05/21 (from the past 48 hour(s))  Lipase, blood     Status: Abnormal   Collection Time: 10/05/21  1:28 PM  Result Value Ref Range   Lipase 2,651 (H) 11 -  51 U/L    Comment: Performed at [facility], [address] [place], [place] [address]  Comprehensive metabolic panel     Status: Abnormal   Collection Time: 10/05/21  1:28 PM  Result Value Ref Range   Sodium 139 135 - 145 mmol/L   Potassium 4.4 3.5 - 5.1 mmol/L   Chloride 105 98 - 111 mmol/L   CO2 23 22 - 32 mmol/L   Glucose, Bld 138 (H) 70 - 99 mg/dL    Comment: Glucose reference range applies only to samples taken after fasting for at least 8 hours.   BUN 35 (H) 8 - 23 mg/dL   Creatinine, Ser 1.85 (H) 0.44 - 1.00 mg/dL   Calcium 9.7 8.9 - 10.3 mg/dL   Total Protein 7.6 6.5 - 8.1 g/dL   Albumin 3.8 3.5 - 5.0 g/dL   AST 1,164 (H) 15 - 41 U/L   ALT 612 (H) 0 - 44 U/L   Alkaline Phosphatase 310 (H) 38 - 126 U/L   Total Bilirubin 1.5 (H) 0.3 - 1.2 mg/dL   GFR, Estimated 29 (L) >60 mL/min     Comment: (NOTE) Calculated using the CKD-EPI Creatinine Equation (2021)    Anion gap 11 5 - 15    Comment: Performed at [facility], [address], [place], [place] [address]  CBC     Status: Abnormal   Collection Time: 10/05/21  1:28 PM  Result Value Ref Range   WBC 10.1 4.0 - 10.5 K/uL   RBC 3.79 (L) 3.87 - 5.11 MIL/uL   Hemoglobin 10.3 (L) 12.0 - 15.0 g/dL   HCT 32.5 (L) 36.0 - 46.0 %   MCV 85.8 80.0 - 100.0 fL   MCH 27.2 26.0 - 34.0 pg   MCHC 31.7 30.0 - 36.0 g/dL   RDW 14.6 11.5 - 15.5 %   Platelets 273 150 - 400 K/uL   nRBC 0.0 0.0 - 0.2 %    Comment: Performed at [facility], [place], [place] [address]  Urinalysis, Routine w reflex microscopic Urine, Clean Catch     Status: Abnormal   Collection Time: 10/05/21  1:28 PM  Result Value Ref Range   Color, Urine YELLOW YELLOW   APPearance HAZY (A) CLEAR   Specific Gravity, Urine 1.022 1.005 - 1.030   pH 5.0 5.0 - 8.0   Glucose, UA NEGATIVE NEGATIVE mg/dL   Hgb urine dipstick TRACE (A) NEGATIVE   Bilirubin Urine NEGATIVE NEGATIVE   Ketones, ur NEGATIVE NEGATIVE mg/dL   Protein, ur 30 (A) NEGATIVE mg/dL   Nitrite NEGATIVE NEGATIVE   Leukocytes,Ua TRACE (A) NEGATIVE   RBC / HPF 0-5 0 - 5 RBC/hpf   WBC, UA 6-10 0 - 5 WBC/hpf   Squamous Epithelial / LPF 21-50 0 - 5   WBC Clumps PRESENT    Mucus PRESENT    Hyaline Casts, UA PRESENT     Comment: Performed at [facility], [address], [place], [place] [address]  Triglycerides     Status: None   Collection Time: 10/05/21  9:18 PM  Result Value Ref Range   Triglycerides 34 <150 mg/dL    Comment: Performed at [facility], [place] [address] [place], [place] [address]  Troponin I (High Sensitivity)     Status: None   Collection Time: 10/05/21  9:18 PM  Result Value Ref Range   Troponin I (High Sensitivity) 10 <18 ng/L    Comment: (NOTE) Elevated high  sensitivity troponin I  (hsTnI) values and significant  changes across serial measurements may suggest ACS but many other  chronic and acute conditions are known to elevate hsTnI results.  Refer to the "Links" section for chest pain algorithms and additional  guidance. Performed at [facility], [place] [address], [place], [place] [address]   Hepatitis panel, acute     Status: None   Collection Time: 10/05/21 10:47 PM  Result Value Ref Range   Hepatitis B Surface Ag NON REACTIVE NON REACTIVE   HCV Ab NON REACTIVE NON REACTIVE    Comment: (NOTE) Nonreactive HCV antibody screen is consistent with no HCV infections,  unless recent infection is suspected or other evidence exists to indicate HCV infection.     Hep A IgM NON REACTIVE NON REACTIVE   Hep B C IgM NON REACTIVE NON REACTIVE    Comment: Performed at [facility], [place] [address], [place], [place] [address]  Magnesium     Status: None   Collection Time: 10/06/21  4:40 AM  Result Value Ref Range   Magnesium 2.2 1.7 - 2.4 mg/dL    Comment: Performed at [facility], [place] [address], [place], [place] [address]  Comprehensive metabolic panel     Status: Abnormal   Collection Time: 10/06/21  4:40 AM  Result Value Ref Range   Sodium 140 135 - 145 mmol/L   Potassium 4.6 3.5 - 5.1 mmol/L   Chloride 111 98 - 111 mmol/L   CO2 23 22 - 32 mmol/L   Glucose, Bld 109 (H) 70 - 99 mg/dL    Comment: Glucose reference range applies only to samples taken after fasting for at least 8 hours.   BUN 34 (H) 8 - 23 mg/dL   Creatinine, Ser 1.70 (H) 0.44 - 1.00 mg/dL   Calcium 9.1 8.9 - 10.3 mg/dL   Total Protein 6.9 6.5 - 8.1 g/dL   Albumin 3.4 (L) 3.5 - 5.0 g/dL   AST 751 (H) 15 - 41 U/L   ALT 581 (H) 0 - 44 U/L   Alkaline Phosphatase 371 (H) 38 - 126 U/L   Total Bilirubin 3.6 (H) 0.3 - 1.2 mg/dL   GFR, Estimated 32 (L) >60 mL/min    Comment: (NOTE) Calculated using the CKD-EPI Creatinine Equation (2021)    Anion gap 6 5 -  15    Comment: Performed at [facility], [place] [address], [place], [place] [address]  CBC     Status: Abnormal   Collection Time: 10/06/21  4:40 AM  Result Value Ref Range   WBC 9.2 4.0 - 10.5 K/uL   RBC 3.32 (L) 3.87 - 5.11 MIL/uL   Hemoglobin 9.1 (L) 12.0 - 15.0 g/dL   HCT 29.0 (L) 36.0 - 46.0 %   MCV 87.3 80.0 - 100.0 fL   MCH 27.4 26.0 - 34.0 pg   MCHC 31.4 30.0 - 36.0 g/dL   RDW 15.1 11.5 - 15.5 %   Platelets 223 150 - 400 K/uL   nRBC 0.0 0.0 - 0.2 %    Comment: Performed at [facility], [place] [address] [place],  [address]  Protime-INR     Status: None   Collection Time: 10/06/21  4:40 AM  Result Value Ref Range   Prothrombin Time 13.8 11.4 - 15.2 seconds   INR 1.1 0.8 - 1.2    Comment: (NOTE) INR goal varies based on device and disease states. Performed at [facility]  [place] [address] [place], [place] [address]    CT ABDOMEN PELVIS WO CONTRAST  Result Date: 10/05/2021 CLINICAL DATA:  Central abdominal pain beginning yesterday. Pancreatitis. EXAM: CT ABDOMEN AND PELVIS WITHOUT CONTRAST TECHNIQUE: Multidetector CT imaging of the abdomen and pelvis was performed following the standard protocol without IV contrast. RADIATION DOSE REDUCTION: This exam was performed according to the departmental dose-optimization program which includes automated exposure control, adjustment of the mA and/or kV according to patient size and/or use of iterative reconstruction technique. COMPARISON:  None Available. FINDINGS: Lower chest: No acute findings. Hepatobiliary: No mass visualized on this unenhanced exam. Numerous tiny calcified gallstones are seen, however there is no evidence of cholecystitis or biliary ductal dilatation. Pancreas: No mass or inflammatory process visualized on this unenhanced exam. Spleen:  Within normal limits in size. Adrenals/Urinary tract: No evidence of urolithiasis or hydronephrosis. Unremarkable unopacified  urinary bladder. Stomach/Bowel: Small hiatal hernia noted. No evidence of obstruction, inflammatory process, or abnormal fluid collections. Vascular/Lymphatic: No pathologically enlarged lymph nodes identified. No evidence of abdominal aortic aneurysm. Aortic atherosclerotic calcification incidentally noted. Reproductive:  No mass or other significant abnormality. Other:  None. Musculoskeletal:  No suspicious bone lesions identified. IMPRESSION: No radiographic signs of acute pancreatitis or other acute findings. Cholelithiasis. No radiographic evidence of cholecystitis. Small hiatal hernia. Electronically Signed   By: Marlaine Hind M.D.   On: 10/05/2021 16:48   MR ABDOMEN MRCP WO CONTRAST  Result Date: 10/06/2021 CLINICAL DATA:  70 year old female with history of right upper quadrant abdominal pain with nausea and vomiting. Cholelithiasis noted on recent ultrasound examination. No prior abdominal MRI. EXAM: MRI ABDOMEN WITHOUT CONTRAST  (INCLUDING MRCP) TECHNIQUE: Multiplanar multisequence MR imaging of the abdomen was performed. Heavily T2-weighted images of the biliary and pancreatic ducts were obtained, and three-dimensional MRCP images were rendered by post processing. COMPARISON:  Right upper quadrant abdominal ultrasound 10/05/2021. CT the abdomen and pelvis 10/05/2021. FINDINGS: Comment: Today's study is limited for detection and characterization of visceral and/or vascular lesions by lack of IV gadolinium. Lower chest: Unremarkable. Hepatobiliary: Diffuse loss of signal intensity throughout the hepatic parenchyma on out of phase dual echo images, indicative of a background of hepatic steatosis. No definite suspicious appearing hepatic lesions are noted on today's noncontrast examination. There is what appears to be a flow void in the central aspect of the liver, likely direct communication between a branch of the right portal vein and the middle hepatic vein, suggesting an intrahepatic shunt. There are  some filling defects lying dependently within the gallbladder, compatible with tiny gallstones. Gallbladder is moderately distended. Gallbladder wall thickness is normal. No pericholecystic fluid or overt surrounding inflammatory changes. MRCP images demonstrate no intra or extrahepatic biliary ductal dilatation. Common bile duct is normal in caliber measuring up to 6 mm in the porta hepatis. No filling defect in the common bile duct to suggest choledocholithiasis. Pancreas: No definite pancreatic mass identified on today's noncontrast examination. No pancreatic ductal dilatation. Mild T2 hyperintensity is noted adjacent to the pancreatic head, which could suggest inflammation. No well-defined peripancreatic fluid collections are noted. Spleen:  Unremarkable. Adrenals/Urinary Tract: Subcentimeter T1 hypointense, T2 hyperintense lesions are noted in the left kidney, incompletely characterize, but statistically likely to represent small cysts (no imaging follow-up is recommended). Right kidney and bilateral adrenal glands are normal in appearance. No hydroureteronephrosis in the visualized portions of the abdomen. Stomach/Bowel: Visualized portions are unremarkable. Vascular/Lymphatic: No aneurysm identified in the visualized abdominal vasculature. No lymphadenopathy noted in the abdomen.  Other: No significant volume of ascites noted in the visualized portions of the peritoneal cavity. Musculoskeletal: No aggressive appearing osseous lesions are noted in the visualized portions of the skeleton. IMPRESSION: 1. Study is positive for cholelithiasis. There is moderate distension of the gallbladder, but no other overt imaging findings to clearly suggest an acute cholecystitis at this time. 2. No choledocholithiasis or evidence to suggest biliary tract obstruction. 3. Mild T2 hyperintensity adjacent to the pancreatic head. This is nonspecific, but could suggest acute inflammation. Correlation with lipase levels is  recommended to exclude possible acute pancreatitis. 4. Intrahepatic shunt with apparent communication between a branch of the right portal vein and the middle hepatic vein, as above. Electronically Signed   By: Vinnie Langton M.D.   On: 10/06/2021 07:26   MR 3D Recon At Scanner  Result Date: 10/06/2021 CLINICAL DATA:  71 year old female with history of right upper quadrant abdominal pain with nausea and vomiting. Cholelithiasis noted on recent ultrasound examination. No prior abdominal MRI. EXAM: MRI ABDOMEN WITHOUT CONTRAST  (INCLUDING MRCP) TECHNIQUE: Multiplanar multisequence MR imaging of the abdomen was performed. Heavily T2-weighted images of the biliary and pancreatic ducts were obtained, and three-dimensional MRCP images were rendered by post processing. COMPARISON:  Right upper quadrant abdominal ultrasound 10/05/2021. CT the abdomen and pelvis 10/05/2021. FINDINGS: Comment: Today's study is limited for detection and characterization of visceral and/or vascular lesions by lack of IV gadolinium. Lower chest: Unremarkable. Hepatobiliary: Diffuse loss of signal intensity throughout the hepatic parenchyma on out of phase dual echo images, indicative of a background of hepatic steatosis. No definite suspicious appearing hepatic lesions are noted on today's noncontrast examination. There is what appears to be a flow void in the central aspect of the liver, likely direct communication between a branch of the right portal vein and the middle hepatic vein, suggesting an intrahepatic shunt. There are some filling defects lying dependently within the gallbladder, compatible with tiny gallstones. Gallbladder is moderately distended. Gallbladder wall thickness is normal. No pericholecystic fluid or overt surrounding inflammatory changes. MRCP images demonstrate no intra or extrahepatic biliary ductal dilatation. Common bile duct is normal in caliber measuring up to 6 mm in the porta hepatis. No filling defect in  the common bile duct to suggest choledocholithiasis. Pancreas: No definite pancreatic mass identified on today's noncontrast examination. No pancreatic ductal dilatation. Mild T2 hyperintensity is noted adjacent to the pancreatic head, which could suggest inflammation. No well-defined peripancreatic fluid collections are noted. Spleen:  Unremarkable. Adrenals/Urinary Tract: Subcentimeter T1 hypointense, T2 hyperintense lesions are noted in the left kidney, incompletely characterize, but statistically likely to represent small cysts (no imaging follow-up is recommended). Right kidney and bilateral adrenal glands are normal in appearance. No hydroureteronephrosis in the visualized portions of the abdomen. Stomach/Bowel: Visualized portions are unremarkable. Vascular/Lymphatic: No aneurysm identified in the visualized abdominal vasculature. No lymphadenopathy noted in the abdomen. Other: No significant volume of ascites noted in the visualized portions of the peritoneal cavity. Musculoskeletal: No aggressive appearing osseous lesions are noted in the visualized portions of the skeleton. IMPRESSION: 1. Study is positive for cholelithiasis. There is moderate distension of the gallbladder, but no other overt imaging findings to clearly suggest an acute cholecystitis at this time. 2. No choledocholithiasis or evidence to suggest biliary tract obstruction. 3. Mild T2 hyperintensity adjacent to the pancreatic head. This is nonspecific, but could suggest acute inflammation. Correlation with lipase levels is recommended to exclude possible acute pancreatitis. 4. Intrahepatic shunt with apparent communication between a branch of the  right portal vein and the middle hepatic vein, as above. Electronically Signed   By: Vinnie Langton M.D.   On: 10/06/2021 07:26   US Abdomen Limited RUQ (LIVER/GB)  Result Date: 10/05/2021 CLINICAL DATA:  RUQ/midline abd pain x 1 day with nausea and vomiting. HTN on meds. EXAM: ULTRASOUND  ABDOMEN LIMITED RIGHT UPPER QUADRANT COMPARISON:  None Available. FINDINGS: Gallbladder: Moderately distended. Small dependent gallstones. No wall thickening. No pericholecystic fluid and no sonographic Murphy's sign. Common bile duct: Diameter: 3 mm. Liver: No focal lesion identified. Within normal limits in parenchymal echogenicity. Portal vein is patent on color Doppler imaging with normal direction of blood flow towards the liver. Other: None. IMPRESSION: 1. No acute findings. 2. Cholelithiasis without evidence of acute cholecystitis. Electronically Signed   By: Lajean Manes M.D.   On: 10/05/2021 17:04    Review Of Systems Constitutional: No fever, chills, weight loss or gain. Eyes: No vision change, wears glasses. No discharge or pain. Ears: No hearing loss, No tinnitus. Respiratory: No asthma, COPD, pneumonias. Positive shortness of breath. No hemoptysis. Cardiovascular: No chest pain, palpitation, positive leg edema. Gastrointestinal: Positive nausea, vomiting, no diarrhea, constipation. No GI bleed. No hepatitis. Genitourinary: No dysuria, hematuria, kidney stone. No incontinance. Neurological: Positive headache, stroke, no seizures.  Psychiatry: No psych facility admission for anxiety, depression, suicide. No detox. Skin: No rash. Musculoskeletal: Positive joint pain, fibromyalgia, neck pain, back pain. Lymphadenopathy: No lymphadenopathy. Hematology: Positive anemia or easy bruising.   Blood pressure (!) 134/47, pulse (!) 109, temperature 99.3 F (37.4 C), temperature source Oral, resp. rate 16, height '5\' 4"'$  (1.626 m), weight 99.1 kg, SpO2 91 %. Body mass index is 37.5 kg/m. General appearance: alert, cooperative, appears stated age and no distress Head: Normocephalic, atraumatic. Eyes: Brown eyes, Pale pink conjunctiva, corneas clear.  Neck: No adenopathy, no carotid bruit, no JVD, supple, symmetrical, trachea midline and thyroid goiter on left side. Resp: Clear to auscultation  bilaterally. Cardio: Regular rate and rhythm, S1, S2 normal, II/VI systolic murmur, no click, rub or gallop GI: Soft, epigastric tenderness; bowel sounds normal; no organomegaly. Extremities: 2 + edema, cyanosis or clubbing. Skin: Warm and dry.  Neurologic: Alert and oriented X 3, normal strength. Normal coordination and gait.  Assessment/Plan Gall stone pancreatitis HTN HLD Morbid obesity S/P stroke BLE Multinodular goiter Exertional dyspnea  Plan Echocardiogram for LV function. Home medications.  Time spent: Review of old records, Lab, x-rays, EKG, other cardiac tests, examination, discussion with patient/Family over 70 minutes.  Birdie Riddle, MD  10/06/2021, 7:31 PM

## 2021-10-06 NOTE — Consult Note (Signed)
Referring Provider: [facility] Primary Care Physician:  Lucianne Lei, MD Primary Gastroenterologist:  Althia Forts  Reason for Consultation:  Gallstone Pancreatitis  HPI: Susan Nichols is a 71 y.o. female medical history significant for history of CVA, HTN, HLD, multinodular goiter presents for evaluation of gallstone pancreatitis.  Patient presented to ED 5/29 for evaluation of abdominal pain associated with nausea and vomiting.  She ate ribs from [employer] 5/28 and then developed severe epigastric abdominal pain as well as nausea and small amount of emesis. This went on throughout the day and night. She took pepto and [employer] with no relief. After pepto use noticed black stools. Came to ED for continued pain. No previous episodes of this before. Denies hematochezia. Denies alcohol use, NSAID use, or tobacco use. Denies family history of colon cancer.  Work up includes lipase of 2,651, elevated LFTs and CT abdomen pelvis without contrast showed cholelithiasis, no signs of acute pancreatitis. RUQ ultrasound showed cholelithiasis without evidence of acute cholecystitis MRCP 10/06/2021: Shows cholelithiasis, moderate distention of the gallbladder.  No choledocholithiasis.  Possible acute inflammation of the pancreatic head.  Intrahepatic shunt with apparent communication between a branch of the right portal vein and middle hepatic vein.   Past Medical History:  Diagnosis Date   Hyperlipemia    Hypertension    Stroke [facility])     Past Surgical History:  Procedure Laterality Date   TOTAL KNEE ARTHROPLASTY  2007   right knee    Prior to Admission medications   Medication Sig Start Date End Date Taking? Authorizing Provider  amLODipine (NORVASC) 10 MG tablet Take 1 tablet (10 mg total) by mouth daily. Patient taking differently: Take 10 mg by mouth every evening. 11/26/17  Yes Emokpae, Courage, MD  aspirin 81 MG tablet Take 1 tablet (81 mg total) by mouth daily. 03/29/18  Yes McCue,  Janett Billow, NP  atorvastatin (LIPITOR) 40 MG tablet Take 1 tablet (40 mg total) by mouth daily. Patient taking differently: Take 40 mg by mouth every evening. 11/26/17  Yes Emokpae, Courage, MD  chlorthalidone (HYGROTON) 25 MG tablet Take 1 tablet (25 mg total) by mouth daily. 11/26/17  Yes Emokpae, Courage, MD  cholecalciferol (VITAMIN D3) 25 MCG (1000 UNIT) tablet Take 1,000 Units by mouth daily.   Yes [provider]  cloNIDine (CATAPRES) 0.2 MG tablet Take 0.2 mg by mouth 2 (two) times daily.   Yes [provider]  diphenhydramine-acetaminophen (TYLENOL PM) 25-500 MG TABS tablet Take 1 tablet by mouth in the morning and at bedtime.   Yes [provider]  FARXIGA 10 MG TABS tablet Take 10 mg by mouth daily. 06/19/21  Yes [provider]  hydrALAZINE (APRESOLINE) 50 MG tablet Take 1 tablet (50 mg total) by mouth 2 (two) times daily. 11/26/17  Yes Emokpae, Courage, MD  lisinopril (PRINIVIL,ZESTRIL) 20 MG tablet Take 1 tablet (20 mg total) by mouth daily. Patient taking differently: Take 20 mg by mouth every evening. 11/26/17  Yes Emokpae, Courage, MD  Magnesium 400 MG TABS Take 1 tablet by mouth daily.   Yes [provider]  metoprolol tartrate (LOPRESSOR) 50 MG tablet Take 1 tablet (50 mg total) by mouth 2 (two) times daily. 11/26/17 10/05/21 Yes Emokpae, Courage, MD  potassium chloride (KLOR-CON) 10 MEQ tablet Take 10 mEq by mouth daily. 07/29/21  Yes [provider]  isosorbide mononitrate (IMDUR) 60 MG 24 hr tablet Take 1 tablet (60 mg total) by mouth daily. Patient not taking: Reported on 10/05/2021 11/26/17  Roxan Hockey, MD    Scheduled Meds:  heparin  5,000 Units Subcutaneous Q8H   Continuous Infusions:  lactated ringers 125 mL/hr at 10/06/21 0718   PRN Meds:.hydrALAZINE, HYDROmorphone (DILAUDID) injection, ondansetron **OR** ondansetron (ZOFRAN) IV  Allergies as of 10/05/2021   (No Known Allergies)    Family History  Problem  Relation Age of Onset   Stroke Mother    Hypertension Mother    Hyperlipidemia Mother    Hypertension Father    Hyperlipidemia Father    Stroke Brother     Social History   Socioeconomic History   Marital status: Single    Spouse name: Not on file   Number of children: Not on file   Years of education: Not on file   Highest education level: Not on file  Occupational History   Not on file  Tobacco Use   Smoking status: Never   Smokeless tobacco: Never  Vaping Use   Vaping Use: Never used  Substance and Sexual Activity   Alcohol use: No   Drug use: No   Sexual activity: Not on file  Other Topics Concern   Not on file  Social History Narrative   Not on file   Social Determinants of Health   Financial Resource Strain: Not on file  Food Insecurity: Not on file  Transportation Needs: Not on file  Physical Activity: Not on file  Stress: Not on file  Social Connections: Not on file  Intimate Partner Violence: Not on file    Review of Systems: Review of Systems  Constitutional:  Negative for chills and fever.  HENT:  Negative for hearing loss and tinnitus.   Eyes:  Negative for blurred vision and double vision.  Respiratory:  Negative for cough and hemoptysis.   Cardiovascular:  Negative for chest pain and palpitations.  Gastrointestinal:  Positive for abdominal pain, melena (pepto use), nausea and vomiting. Negative for blood in stool, constipation, diarrhea and heartburn.  Genitourinary:  Negative for dysuria and urgency.  Musculoskeletal:  Negative for myalgias and neck pain.  Skin:  Negative for itching and rash.  Neurological:  Negative for seizures and loss of consciousness.  Psychiatric/Behavioral:  Negative for depression and suicidal ideas.     Physical Exam:Physical Exam Constitutional:      Appearance: She is obese.     Comments: Daughter at bedside  HENT:     Head: Normocephalic and atraumatic.     Nose: Nose normal. No congestion.     Mouth/Throat:      Mouth: Mucous membranes are moist.     Pharynx: Oropharynx is clear.  Eyes:     Extraocular Movements: Extraocular movements intact.     Conjunctiva/sclera: Conjunctivae normal.  Cardiovascular:     Rate and Rhythm: Normal rate and regular rhythm.  Pulmonary:     Effort: Pulmonary effort is normal. No respiratory distress.  Abdominal:     General: Bowel sounds are normal. There is no distension.     Palpations: Abdomen is soft. There is no mass.     Tenderness: There is abdominal tenderness (epigastric). There is no guarding or rebound.     Hernia: No hernia is present.  Musculoskeletal:        General: No swelling. Normal range of motion.     Cervical back: Normal range of motion and neck supple.  Skin:    General: Skin is warm and dry.  Neurological:     General: No focal deficit present.     Mental Status:  She is alert and oriented to person, place, and time.  Psychiatric:        Mood and Affect: Mood normal.        Behavior: Behavior normal.        Thought Content: Thought content normal.        Judgment: Judgment normal.    Vital signs: Vitals:   10/06/21 0212 10/06/21 0721  BP: (!) 144/38 (!) 148/68  Pulse: 99 (!) 106  Resp: 20 20  Temp: 99 F (37.2 C) 99.7 F (37.6 C)  SpO2: 92% 99%   Last BM Date : 10/05/21    GI:  Lab Results: Recent Labs    10/05/21 1328 10/06/21 0440  WBC 10.1 9.2  HGB 10.3* 9.1*  HCT 32.5* 29.0*  PLT 273 223   BMET Recent Labs    10/05/21 1328 10/06/21 0440  NA 139 140  K 4.4 4.6  CL 105 111  CO2 23 23  GLUCOSE 138* 109*  BUN 35* 34*  CREATININE 1.85* 1.70*  CALCIUM 9.7 9.1   LFT Recent Labs    10/06/21 0440  PROT 6.9  ALBUMIN 3.4*  AST 751*  ALT 581*  ALKPHOS 371*  BILITOT 3.6*   PT/INR Recent Labs    10/06/21 0440  LABPROT 13.8  INR 1.1     Studies/Results: CT ABDOMEN PELVIS WO CONTRAST  Result Date: 10/05/2021 CLINICAL DATA:  Central abdominal pain beginning yesterday. Pancreatitis. EXAM:  CT ABDOMEN AND PELVIS WITHOUT CONTRAST TECHNIQUE: Multidetector CT imaging of the abdomen and pelvis was performed following the standard protocol without IV contrast. RADIATION DOSE REDUCTION: This exam was performed according to the departmental dose-optimization program which includes automated exposure control, adjustment of the mA and/or kV according to patient size and/or use of iterative reconstruction technique. COMPARISON:  None Available. FINDINGS: Lower chest: No acute findings. Hepatobiliary: No mass visualized on this unenhanced exam. Numerous tiny calcified gallstones are seen, however there is no evidence of cholecystitis or biliary ductal dilatation. Pancreas: No mass or inflammatory process visualized on this unenhanced exam. Spleen:  Within normal limits in size. Adrenals/Urinary tract: No evidence of urolithiasis or hydronephrosis. Unremarkable unopacified urinary bladder. Stomach/Bowel: Small hiatal hernia noted. No evidence of obstruction, inflammatory process, or abnormal fluid collections. Vascular/Lymphatic: No pathologically enlarged lymph nodes identified. No evidence of abdominal aortic aneurysm. Aortic atherosclerotic calcification incidentally noted. Reproductive:  No mass or other significant abnormality. Other:  None. Musculoskeletal:  No suspicious bone lesions identified. IMPRESSION: No radiographic signs of acute pancreatitis or other acute findings. Cholelithiasis. No radiographic evidence of cholecystitis. Small hiatal hernia. Electronically Signed   By: Marlaine Hind M.D.   On: 10/05/2021 16:48   MR ABDOMEN MRCP WO CONTRAST  Result Date: 10/06/2021 CLINICAL DATA:  71 year old female with history of right upper quadrant abdominal pain with nausea and vomiting. Cholelithiasis noted on recent ultrasound examination. No prior abdominal MRI. EXAM: MRI ABDOMEN WITHOUT CONTRAST  (INCLUDING MRCP) TECHNIQUE: Multiplanar multisequence MR imaging of the abdomen was performed. Heavily  T2-weighted images of the biliary and pancreatic ducts were obtained, and three-dimensional MRCP images were rendered by post processing. COMPARISON:  Right upper quadrant abdominal ultrasound 10/05/2021. CT the abdomen and pelvis 10/05/2021. FINDINGS: Comment: Today's study is limited for detection and characterization of visceral and/or vascular lesions by lack of IV gadolinium. Lower chest: Unremarkable. Hepatobiliary: Diffuse loss of signal intensity throughout the hepatic parenchyma on out of phase dual echo images, indicative of a background of hepatic steatosis. No definite suspicious appearing hepatic  lesions are noted on today's noncontrast examination. There is what appears to be a flow void in the central aspect of the liver, likely direct communication between a branch of the right portal vein and the middle hepatic vein, suggesting an intrahepatic shunt. There are some filling defects lying dependently within the gallbladder, compatible with tiny gallstones. Gallbladder is moderately distended. Gallbladder wall thickness is normal. No pericholecystic fluid or overt surrounding inflammatory changes. MRCP images demonstrate no intra or extrahepatic biliary ductal dilatation. Common bile duct is normal in caliber measuring up to 6 mm in the porta hepatis. No filling defect in the common bile duct to suggest choledocholithiasis. Pancreas: No definite pancreatic mass identified on today's noncontrast examination. No pancreatic ductal dilatation. Mild T2 hyperintensity is noted adjacent to the pancreatic head, which could suggest inflammation. No well-defined peripancreatic fluid collections are noted. Spleen:  Unremarkable. Adrenals/Urinary Tract: Subcentimeter T1 hypointense, T2 hyperintense lesions are noted in the left kidney, incompletely characterize, but statistically likely to represent small cysts (no imaging follow-up is recommended). Right kidney and bilateral adrenal glands are normal in  appearance. No hydroureteronephrosis in the visualized portions of the abdomen. Stomach/Bowel: Visualized portions are unremarkable. Vascular/Lymphatic: No aneurysm identified in the visualized abdominal vasculature. No lymphadenopathy noted in the abdomen. Other: No significant volume of ascites noted in the visualized portions of the peritoneal cavity. Musculoskeletal: No aggressive appearing osseous lesions are noted in the visualized portions of the skeleton. IMPRESSION: 1. Study is positive for cholelithiasis. There is moderate distension of the gallbladder, but no other overt imaging findings to clearly suggest an acute cholecystitis at this time. 2. No choledocholithiasis or evidence to suggest biliary tract obstruction. 3. Mild T2 hyperintensity adjacent to the pancreatic head. This is nonspecific, but could suggest acute inflammation. Correlation with lipase levels is recommended to exclude possible acute pancreatitis. 4. Intrahepatic shunt with apparent communication between a branch of the right portal vein and the middle hepatic vein, as above. Electronically Signed   By: Vinnie Langton M.D.   On: 10/06/2021 07:26   MR 3D Recon At Scanner  Result Date: 10/06/2021 CLINICAL DATA:  71 year old female with history of right upper quadrant abdominal pain with nausea and vomiting. Cholelithiasis noted on recent ultrasound examination. No prior abdominal MRI. EXAM: MRI ABDOMEN WITHOUT CONTRAST  (INCLUDING MRCP) TECHNIQUE: Multiplanar multisequence MR imaging of the abdomen was performed. Heavily T2-weighted images of the biliary and pancreatic ducts were obtained, and three-dimensional MRCP images were rendered by post processing. COMPARISON:  Right upper quadrant abdominal ultrasound 10/05/2021. CT the abdomen and pelvis 10/05/2021. FINDINGS: Comment: Today's study is limited for detection and characterization of visceral and/or vascular lesions by lack of IV gadolinium. Lower chest: Unremarkable.  Hepatobiliary: Diffuse loss of signal intensity throughout the hepatic parenchyma on out of phase dual echo images, indicative of a background of hepatic steatosis. No definite suspicious appearing hepatic lesions are noted on today's noncontrast examination. There is what appears to be a flow void in the central aspect of the liver, likely direct communication between a branch of the right portal vein and the middle hepatic vein, suggesting an intrahepatic shunt. There are some filling defects lying dependently within the gallbladder, compatible with tiny gallstones. Gallbladder is moderately distended. Gallbladder wall thickness is normal. No pericholecystic fluid or overt surrounding inflammatory changes. MRCP images demonstrate no intra or extrahepatic biliary ductal dilatation. Common bile duct is normal in caliber measuring up to 6 mm in the porta hepatis. No filling defect in the common bile duct  to suggest choledocholithiasis. Pancreas: No definite pancreatic mass identified on today's noncontrast examination. No pancreatic ductal dilatation. Mild T2 hyperintensity is noted adjacent to the pancreatic head, which could suggest inflammation. No well-defined peripancreatic fluid collections are noted. Spleen:  Unremarkable. Adrenals/Urinary Tract: Subcentimeter T1 hypointense, T2 hyperintense lesions are noted in the left kidney, incompletely characterize, but statistically likely to represent small cysts (no imaging follow-up is recommended). Right kidney and bilateral adrenal glands are normal in appearance. No hydroureteronephrosis in the visualized portions of the abdomen. Stomach/Bowel: Visualized portions are unremarkable. Vascular/Lymphatic: No aneurysm identified in the visualized abdominal vasculature. No lymphadenopathy noted in the abdomen. Other: No significant volume of ascites noted in the visualized portions of the peritoneal cavity. Musculoskeletal: No aggressive appearing osseous lesions are  noted in the visualized portions of the skeleton. IMPRESSION: 1. Study is positive for cholelithiasis. There is moderate distension of the gallbladder, but no other overt imaging findings to clearly suggest an acute cholecystitis at this time. 2. No choledocholithiasis or evidence to suggest biliary tract obstruction. 3. Mild T2 hyperintensity adjacent to the pancreatic head. This is nonspecific, but could suggest acute inflammation. Correlation with lipase levels is recommended to exclude possible acute pancreatitis. 4. Intrahepatic shunt with apparent communication between a branch of the right portal vein and the middle hepatic vein, as above. Electronically Signed   By: Vinnie Langton M.D.   On: 10/06/2021 07:26   US Abdomen Limited RUQ (LIVER/GB)  Result Date: 10/05/2021 CLINICAL DATA:  RUQ/midline abd pain x 1 day with nausea and vomiting. HTN on meds. EXAM: ULTRASOUND ABDOMEN LIMITED RIGHT UPPER QUADRANT COMPARISON:  None Available. FINDINGS: Gallbladder: Moderately distended. Small dependent gallstones. No wall thickening. No pericholecystic fluid and no sonographic Murphy's sign. Common bile duct: Diameter: 3 mm. Liver: No focal lesion identified. Within normal limits in parenchymal echogenicity. Portal vein is patent on color Doppler imaging with normal direction of blood flow towards the liver. Other: None. IMPRESSION: 1. No acute findings. 2. Cholelithiasis without evidence of acute cholecystitis. Electronically Signed   By: Lajean Manes M.D.   On: 10/05/2021 17:04    Impression: Gallstone pancreatitis - MRCP 10/06/2021: Shows cholelithiasis, moderate distention of the gallbladder.  No choledocholithiasis.  Possible acute inflammation of the pancreatic head.  Intrahepatic shunt with apparent communication between a branch of the right portal vein and middle hepatic vein - Lipase 2,651 -No leukocytosis -Hgb 9.1, MCV 87.3 -AST 751 (1,164)/ ALT 581 (612)/ Alk phos 371 (310) -T. bili 3.6  (1.5) -BUN 34, creatinine 1.70 - ECHO 2019: EF 60-65%  Plan: MRCP and lipase indicate pancreatitis, likely from gallstones. Elevated LFTs likely secondary to gallstone pancreatitis.  Will increase lactated ringers to 150 mL/hr Continue NPO. Can considering advancing to clear liquids as tolerated if patient is interested. Will get surgical consult as patient will need cholecystectomy at some point (whether inpatient or outpatient). Continue supportive care and pain management [facility] will follow    LOS: 1 day   Garnette Scheuermann  PA-C 10/06/2021, 7:46 AM  Contact #  [phone]

## 2021-10-06 NOTE — Consult Note (Signed)
Susan Nichols [birth year]  [id number].    Requesting MD: Dr. Jolaine Artist Chief Complaint/Reason for Consult: gallstone pancreatitis  HPI:  This is a pleasant obese 71 yo black female with a history of HTN, HLD, CVA (2019), multinodular goiter with tracheal deviation (no dysphonia, etc), dyspnea with exertion (SOB when walking to her car), BLE, as well as new found atherosclerotic disease (severe) in her thoracic aorta with some mild dilation of her main pulmonary artery who was eating ribs and a salad on Sunday when she developed an acute onset of epigastric abdominal pain after eating.  She developed some nausea and one episode of emesis on Monday.  Her pain persisted and nothing made it better.  She denies any fevers or diarrhea.  She does admit to some SOB.  She states that she gets SOB just walking to her care.  She admits to BLE edema as well which does not go away when she puts her feet up.  She was seen by her cardiologist several weeks ago.  She thinks her last echo was 2-3 months ago, but we can not see this in the chart.  Her last echo in our system was in 2019 at the time of her CVA.    Due to persistent pain, she came to the ED where she was found to have a lipase>2000 and elevated LFTs.  She has undergone [place], CT, and MRCP.  She does have gallstones, but no evidence of choledocholithiasis.  Her LFTs have actually trended up today, despite her negative MRCP.  She states her pain is improving.  We have been asked to see her for evaluation and consideration of lap chole.  ROS: ROS: Please see HPI  Family History  Problem Relation Age of Onset   Stroke Mother    Hypertension Mother    Hyperlipidemia Mother    Hypertension Father    Hyperlipidemia Father    Stroke Brother     Past Medical History:  Diagnosis Date   Coronary artery disease    Dyspnea    Hyperlipemia    Hypertension    Lower extremity edema    Multinodular goiter    causing tracheal deviation    Stroke [facility])     Past Surgical History:  Procedure Laterality Date   TOTAL KNEE ARTHROPLASTY  05/10/2005   right knee   TUBAL LIGATION      Social History:  reports that she has never smoked. She has never used smokeless tobacco. She reports that she does not drink alcohol and does not use drugs.  Allergies: No Known Allergies  Medications Prior to Admission  Medication Sig Dispense Refill   amLODipine (NORVASC) 10 MG tablet Take 1 tablet (10 mg total) by mouth daily. (Patient taking differently: Take 10 mg by mouth every evening.) 30 tablet 3   aspirin 81 MG tablet Take 1 tablet (81 mg total) by mouth daily. 30 tablet 0   atorvastatin (LIPITOR) 40 MG tablet Take 1 tablet (40 mg total) by mouth daily. (Patient taking differently: Take 40 mg by mouth every evening.) 30 tablet 3   chlorthalidone (HYGROTON) 25 MG tablet Take 1 tablet (25 mg total) by mouth daily. 30 tablet 5   cholecalciferol (VITAMIN D3) 25 MCG (1000 UNIT) tablet Take 1,000 Units by mouth daily.     cloNIDine (CATAPRES) 0.2 MG tablet Take 0.2 mg by mouth 2 (two) times daily.     diphenhydramine-acetaminophen (TYLENOL PM) 25-500 MG TABS tablet Take 1 tablet by mouth  in the morning and at bedtime.     FARXIGA 10 MG TABS tablet Take 10 mg by mouth daily.     hydrALAZINE (APRESOLINE) 50 MG tablet Take 1 tablet (50 mg total) by mouth 2 (two) times daily. 60 tablet 5   lisinopril (PRINIVIL,ZESTRIL) 20 MG tablet Take 1 tablet (20 mg total) by mouth daily. (Patient taking differently: Take 20 mg by mouth every evening.) 30 tablet 6   Magnesium 400 MG TABS Take 1 tablet by mouth daily.     metoprolol tartrate (LOPRESSOR) 50 MG tablet Take 1 tablet (50 mg total) by mouth 2 (two) times daily. 60 tablet 3   potassium chloride (KLOR-CON) 10 MEQ tablet Take 10 mEq by mouth daily.     isosorbide mononitrate (IMDUR) 60 MG 24 hr tablet Take 1 tablet (60 mg total) by mouth daily. (Patient not taking: Reported on 10/05/2021) 30 tablet 5      Physical Exam: Blood pressure (!) 134/47, pulse (!) 109, temperature 99.3 F (37.4 C), temperature source Oral, resp. rate 16, height '5\' 4"'$  (1.626 m), weight 99.1 kg, SpO2 91 %. General: pleasant, obese black female who is laying in bed in NAD HEENT: head is normocephalic, atraumatic.  Sclera are noninjected.  PERRL.  Ears and nose without any masses or lesions.  Mouth is pink and moist.  Neck is supple but with obvious abnormality on the left side c/w known goiter.  Unable to visualize trachea due to body habitus. Heart: regular rhythm, but slightly tachy.  Normal s1,s2. No obvious gallops, or rubs noted, may have a very soft murmur.  Palpable radial and pedal pulses bilaterally Lungs: CTAB, no wheezes, rhonchi, or rales noted.  Respiratory effort nonlabored Abd: soft, mild epigastric and RUQ abdominal tenderness, ND, but obese, +BS, no masses, hernias, or organomegaly MS: all 4 extremities are symmetrical with no cyanosis, clubbing, but does have 2+ pedal edema bilaterally Skin: warm and dry with no masses, lesions, or rashes Neuro: Cranial nerves 2-12 grossly intact, sensation is normal throughout Psych: A&Ox3 with an appropriate affect.   Results for orders placed or performed during the hospital encounter of 10/05/21 (from the past 48 hour(s))  Lipase, blood     Status: Abnormal   Collection Time: 10/05/21  1:28 PM  Result Value Ref Range   Lipase 2,651 (H) 11 - 51 U/L    Comment: Performed at [facility], [address] [place], [place] [address]  Comprehensive metabolic panel     Status: Abnormal   Collection Time: 10/05/21  1:28 PM  Result Value Ref Range   Sodium 139 135 - 145 mmol/L   Potassium 4.4 3.5 - 5.1 mmol/L   Chloride 105 98 - 111 mmol/L   CO2 23 22 - 32 mmol/L   Glucose, Bld 138 (H) 70 - 99 mg/dL    Comment: Glucose reference range applies only to samples taken after fasting for at least 8 hours.   BUN 35 (H) 8 - 23 mg/dL   Creatinine,  Ser 1.85 (H) 0.44 - 1.00 mg/dL   Calcium 9.7 8.9 - 10.3 mg/dL   Total Protein 7.6 6.5 - 8.1 g/dL   Albumin 3.8 3.5 - 5.0 g/dL   AST 1,164 (H) 15 - 41 U/L   ALT 612 (H) 0 - 44 U/L   Alkaline Phosphatase 310 (H) 38 - 126 U/L   Total Bilirubin 1.5 (H) 0.3 - 1.2 mg/dL   GFR, Estimated 29 (L) >60 mL/min    Comment: (NOTE) Calculated  using the CKD-EPI Creatinine Equation (2021)    Anion gap 11 5 - 15    Comment: Performed at [facility], [address] [place], [place] [address]  CBC     Status: Abnormal   Collection Time: 10/05/21  1:28 PM  Result Value Ref Range   WBC 10.1 4.0 - 10.5 K/uL   RBC 3.79 (L) 3.87 - 5.11 MIL/uL   Hemoglobin 10.3 (L) 12.0 - 15.0 g/dL   HCT 32.5 (L) 36.0 - 46.0 %   MCV 85.8 80.0 - 100.0 fL   MCH 27.2 26.0 - 34.0 pg   MCHC 31.7 30.0 - 36.0 g/dL   RDW 14.6 11.5 - 15.5 %   Platelets 273 150 - 400 K/uL   nRBC 0.0 0.0 - 0.2 %    Comment: Performed at [facility], [place], [place] [address]  Urinalysis, Routine w reflex microscopic Urine, Clean Catch     Status: Abnormal   Collection Time: 10/05/21  1:28 PM  Result Value Ref Range   Color, Urine YELLOW YELLOW   APPearance HAZY (A) CLEAR   Specific Gravity, Urine 1.022 1.005 - 1.030   pH 5.0 5.0 - 8.0   Glucose, UA NEGATIVE NEGATIVE mg/dL   Hgb urine dipstick TRACE (A) NEGATIVE   Bilirubin Urine NEGATIVE NEGATIVE   Ketones, ur NEGATIVE NEGATIVE mg/dL   Protein, ur 30 (A) NEGATIVE mg/dL   Nitrite NEGATIVE NEGATIVE   Leukocytes,Ua TRACE (A) NEGATIVE   RBC / HPF 0-5 0 - 5 RBC/hpf   WBC, UA 6-10 0 - 5 WBC/hpf   Squamous Epithelial / LPF 21-50 0 - 5   WBC Clumps PRESENT    Mucus PRESENT    Hyaline Casts, UA PRESENT     Comment: Performed at [facility], [address], [place], [place] [address]  Triglycerides     Status: None   Collection Time: 10/05/21  9:18 PM  Result Value Ref Range   Triglycerides 34 <150 mg/dL     Comment: Performed at [facility], [place] [address], [place], [place] [address]  Troponin I (High Sensitivity)     Status: None   Collection Time: 10/05/21  9:18 PM  Result Value Ref Range   Troponin I (High Sensitivity) 10 <18 ng/L    Comment: (NOTE) Elevated high sensitivity troponin I (hsTnI) values and significant  changes across serial measurements may suggest ACS but many other  chronic and acute conditions are known to elevate hsTnI results.  Refer to the "Links" section for chest pain algorithms and additional  guidance. Performed at [facility], [place] [address], [place], [place] [address]   Hepatitis panel, acute     Status: None   Collection Time: 10/05/21 10:47 PM  Result Value Ref Range   Hepatitis B Surface Ag NON REACTIVE NON REACTIVE   HCV Ab NON REACTIVE NON REACTIVE    Comment: (NOTE) Nonreactive HCV antibody screen is consistent with no HCV infections,  unless recent infection is suspected or other evidence exists to indicate HCV infection.     Hep A IgM NON REACTIVE NON REACTIVE   Hep B C IgM NON REACTIVE NON REACTIVE    Comment: Performed at [facility], [place] [address], [place], [place] [address]  Magnesium     Status: None   Collection Time: 10/06/21  4:40 AM  Result Value Ref Range   Magnesium 2.2 1.7 - 2.4 mg/dL    Comment: Performed  at [facility], [place] [address], [place], [place] [address]  Comprehensive metabolic panel     Status: Abnormal   Collection Time: 10/06/21  4:40 AM  Result Value Ref Range   Sodium 140 135 - 145 mmol/L   Potassium 4.6 3.5 - 5.1 mmol/L   Chloride 111 98 - 111 mmol/L   CO2 23 22 - 32 mmol/L   Glucose, Bld 109 (H) 70 - 99 mg/dL    Comment: Glucose reference range applies only to samples taken after fasting for at least 8 hours.   BUN 34 (H) 8 - 23 mg/dL   Creatinine, Ser 1.70 (H) 0.44 - 1.00 mg/dL   Calcium 9.1 8.9 - 10.3 mg/dL   Total Protein 6.9 6.5 - 8.1  g/dL   Albumin 3.4 (L) 3.5 - 5.0 g/dL   AST 751 (H) 15 - 41 U/L   ALT 581 (H) 0 - 44 U/L   Alkaline Phosphatase 371 (H) 38 - 126 U/L   Total Bilirubin 3.6 (H) 0.3 - 1.2 mg/dL   GFR, Estimated 32 (L) >60 mL/min    Comment: (NOTE) Calculated using the CKD-EPI Creatinine Equation (2021)    Anion gap 6 5 - 15    Comment: Performed at [facility], [place] [address], [place], [place] [address]  CBC     Status: Abnormal   Collection Time: 10/06/21  4:40 AM  Result Value Ref Range   WBC 9.2 4.0 - 10.5 K/uL   RBC 3.32 (L) 3.87 - 5.11 MIL/uL   Hemoglobin 9.1 (L) 12.0 - 15.0 g/dL   HCT 29.0 (L) 36.0 - 46.0 %   MCV 87.3 80.0 - 100.0 fL   MCH 27.4 26.0 - 34.0 pg   MCHC 31.4 30.0 - 36.0 g/dL   RDW 15.1 11.5 - 15.5 %   Platelets 223 150 - 400 K/uL   nRBC 0.0 0.0 - 0.2 %    Comment: Performed at [facility], [place] [address], [place], [place] [address]  Protime-INR     Status: None   Collection Time: 10/06/21  4:40 AM  Result Value Ref Range   Prothrombin Time 13.8 11.4 - 15.2 seconds   INR 1.1 0.8 - 1.2    Comment: (NOTE) INR goal varies based on device and disease states. Performed at [facility], [place] [address] [place], [place] [address]    CT ABDOMEN PELVIS WO CONTRAST  Result Date: 10/05/2021 CLINICAL DATA:  Central abdominal pain beginning yesterday. Pancreatitis. EXAM: CT ABDOMEN AND PELVIS WITHOUT CONTRAST TECHNIQUE: Multidetector CT imaging of the abdomen and pelvis was performed following the standard protocol without IV contrast. RADIATION DOSE REDUCTION: This exam was performed according to the departmental dose-optimization program which includes automated exposure control, adjustment of the mA and/or kV according to patient size and/or use of iterative reconstruction technique. COMPARISON:  None Available. FINDINGS: Lower chest: No acute findings. Hepatobiliary: No mass visualized on this unenhanced exam. Numerous tiny  calcified gallstones are seen, however there is no evidence of cholecystitis or biliary ductal dilatation. Pancreas: No mass or inflammatory process visualized on this unenhanced exam. Spleen:  Within normal limits in size. Adrenals/Urinary tract: No evidence of urolithiasis or hydronephrosis. Unremarkable unopacified urinary bladder. Stomach/Bowel: Small hiatal hernia noted. No evidence of obstruction, inflammatory process, or abnormal fluid collections. Vascular/Lymphatic: No pathologically enlarged lymph nodes identified. No evidence of abdominal aortic aneurysm. Aortic atherosclerotic calcification incidentally noted. Reproductive:  No mass or other significant abnormality. Other:  None. Musculoskeletal:  No suspicious bone lesions identified. IMPRESSION: No radiographic signs of acute pancreatitis or other acute findings. Cholelithiasis. No radiographic evidence of cholecystitis. Small hiatal hernia. Electronically Signed   By: Marlaine Hind M.D.   On: 10/05/2021 16:48   MR ABDOMEN MRCP WO CONTRAST  Result Date: 10/06/2021 CLINICAL DATA:  71 year old female with history of right upper quadrant abdominal pain with nausea and vomiting. Cholelithiasis noted on recent ultrasound examination. No prior abdominal MRI. EXAM: MRI ABDOMEN WITHOUT CONTRAST  (INCLUDING MRCP) TECHNIQUE: Multiplanar multisequence MR imaging of the abdomen was performed. Heavily T2-weighted images of the biliary and pancreatic ducts were obtained, and three-dimensional MRCP images were rendered by post processing. COMPARISON:  Right upper quadrant abdominal ultrasound 10/05/2021. CT the abdomen and pelvis 10/05/2021. FINDINGS: Comment: Today's study is limited for detection and characterization of visceral and/or vascular lesions by lack of IV gadolinium. Lower chest: Unremarkable. Hepatobiliary: Diffuse loss of signal intensity throughout the hepatic parenchyma on out of phase dual echo images, indicative of a background of hepatic  steatosis. No definite suspicious appearing hepatic lesions are noted on today's noncontrast examination. There is what appears to be a flow void in the central aspect of the liver, likely direct communication between a branch of the right portal vein and the middle hepatic vein, suggesting an intrahepatic shunt. There are some filling defects lying dependently within the gallbladder, compatible with tiny gallstones. Gallbladder is moderately distended. Gallbladder wall thickness is normal. No pericholecystic fluid or overt surrounding inflammatory changes. MRCP images demonstrate no intra or extrahepatic biliary ductal dilatation. Common bile duct is normal in caliber measuring up to 6 mm in the porta hepatis. No filling defect in the common bile duct to suggest choledocholithiasis. Pancreas: No definite pancreatic mass identified on today's noncontrast examination. No pancreatic ductal dilatation. Mild T2 hyperintensity is noted adjacent to the pancreatic head, which could suggest inflammation. No well-defined peripancreatic fluid collections are noted. Spleen:  Unremarkable. Adrenals/Urinary Tract: Subcentimeter T1 hypointense, T2 hyperintense lesions are noted in the left kidney, incompletely characterize, but statistically likely to represent small cysts (no imaging follow-up is recommended). Right kidney and bilateral adrenal glands are normal in appearance. No hydroureteronephrosis in the visualized portions of the abdomen. Stomach/Bowel: Visualized portions are unremarkable. Vascular/Lymphatic: No aneurysm identified in the visualized abdominal vasculature. No lymphadenopathy noted in the abdomen. Other: No significant volume of ascites noted in the visualized portions of the peritoneal cavity. Musculoskeletal: No aggressive appearing osseous lesions are noted in the visualized portions of the skeleton. IMPRESSION: 1. Study is positive for cholelithiasis. There is moderate distension of the gallbladder, but  no other overt imaging findings to clearly suggest an acute cholecystitis at this time. 2. No choledocholithiasis or evidence to suggest biliary tract obstruction. 3. Mild T2 hyperintensity adjacent to the pancreatic head. This is nonspecific, but could suggest acute inflammation. Correlation with lipase levels is recommended to exclude possible acute pancreatitis. 4. Intrahepatic shunt with apparent communication between a branch of the right portal vein and the middle hepatic vein, as above. Electronically Signed   By: Vinnie Langton M.D.   On: 10/06/2021 07:26   MR 3D Recon At Scanner  Result Date: 10/06/2021 CLINICAL DATA:  71 year old female with history of right upper quadrant abdominal pain with nausea and vomiting. Cholelithiasis noted on recent ultrasound examination. No prior abdominal MRI. EXAM: MRI ABDOMEN WITHOUT CONTRAST  (INCLUDING MRCP) TECHNIQUE: Multiplanar multisequence MR imaging of the abdomen was performed. Heavily T2-weighted images of the biliary and pancreatic ducts were  obtained, and three-dimensional MRCP images were rendered by post processing. COMPARISON:  Right upper quadrant abdominal ultrasound 10/05/2021. CT the abdomen and pelvis 10/05/2021. FINDINGS: Comment: Today's study is limited for detection and characterization of visceral and/or vascular lesions by lack of IV gadolinium. Lower chest: Unremarkable. Hepatobiliary: Diffuse loss of signal intensity throughout the hepatic parenchyma on out of phase dual echo images, indicative of a background of hepatic steatosis. No definite suspicious appearing hepatic lesions are noted on today's noncontrast examination. There is what appears to be a flow void in the central aspect of the liver, likely direct communication between a branch of the right portal vein and the middle hepatic vein, suggesting an intrahepatic shunt. There are some filling defects lying dependently within the gallbladder, compatible with tiny gallstones.  Gallbladder is moderately distended. Gallbladder wall thickness is normal. No pericholecystic fluid or overt surrounding inflammatory changes. MRCP images demonstrate no intra or extrahepatic biliary ductal dilatation. Common bile duct is normal in caliber measuring up to 6 mm in the porta hepatis. No filling defect in the common bile duct to suggest choledocholithiasis. Pancreas: No definite pancreatic mass identified on today's noncontrast examination. No pancreatic ductal dilatation. Mild T2 hyperintensity is noted adjacent to the pancreatic head, which could suggest inflammation. No well-defined peripancreatic fluid collections are noted. Spleen:  Unremarkable. Adrenals/Urinary Tract: Subcentimeter T1 hypointense, T2 hyperintense lesions are noted in the left kidney, incompletely characterize, but statistically likely to represent small cysts (no imaging follow-up is recommended). Right kidney and bilateral adrenal glands are normal in appearance. No hydroureteronephrosis in the visualized portions of the abdomen. Stomach/Bowel: Visualized portions are unremarkable. Vascular/Lymphatic: No aneurysm identified in the visualized abdominal vasculature. No lymphadenopathy noted in the abdomen. Other: No significant volume of ascites noted in the visualized portions of the peritoneal cavity. Musculoskeletal: No aggressive appearing osseous lesions are noted in the visualized portions of the skeleton. IMPRESSION: 1. Study is positive for cholelithiasis. There is moderate distension of the gallbladder, but no other overt imaging findings to clearly suggest an acute cholecystitis at this time. 2. No choledocholithiasis or evidence to suggest biliary tract obstruction. 3. Mild T2 hyperintensity adjacent to the pancreatic head. This is nonspecific, but could suggest acute inflammation. Correlation with lipase levels is recommended to exclude possible acute pancreatitis. 4. Intrahepatic shunt with apparent communication  between a branch of the right portal vein and the middle hepatic vein, as above. Electronically Signed   By: Vinnie Langton M.D.   On: 10/06/2021 07:26   US Abdomen Limited RUQ (LIVER/GB)  Result Date: 10/05/2021 CLINICAL DATA:  RUQ/midline abd pain x 1 day with nausea and vomiting. HTN on meds. EXAM: ULTRASOUND ABDOMEN LIMITED RIGHT UPPER QUADRANT COMPARISON:  None Available. FINDINGS: Gallbladder: Moderately distended. Small dependent gallstones. No wall thickening. No pericholecystic fluid and no sonographic Murphy's sign. Common bile duct: Diameter: 3 mm. Liver: No focal lesion identified. Within normal limits in parenchymal echogenicity. Portal vein is patent on color Doppler imaging with normal direction of blood flow towards the liver. Other: None. IMPRESSION: 1. No acute findings. 2. Cholelithiasis without evidence of acute cholecystitis. Electronically Signed   By: Lajean Manes M.D.   On: 10/05/2021 17:04      Assessment/Plan Gallstone pancreatitis The patient has been seen, examined, labs, vitals, care everywhere, etc reviewed and patient appears to have pancreatitis likely related to her gallstones given her LFT elevation as well as no other risk factors for any of the other etiologies.  Her MRCP is negative for choledocholithiasis; however,  her LFTs are actually trending up today instead of down.  Will plan to recheck her labs in the morning, including her lipase.  I have discussed surgery with her as well as her daughter who is a Marine scientist and is at bedside.  We also discussed her need for anesthesia to weigh in about her tracheal deviation to assure safe airway during surgery.  We also discussed the need for cardiology to see her and likely an new echo, unless she has had one recently in the cardiology office.  Her atherosclerotic disease noted in her aorta, history of CVA, SOB, and BLE edema, warranted cards evaluation for perioperative clearance.  No new EKG since 2019 in our systems, so  will obtain a new pre-op EKG as well.  I have discussed her case with Dr. Lissa Hoard with anesthesia and the primary service has called cardiology at our request. Her pain is overall improving, but still present today.  Will make her NPO p MN and plan for OR tomorrow pending her physical exam as well as pending cardiac/anesthesia clearance.   FEN - FLD/NPO p MN/IVFs VTE - ok for chemical prophylaxis from our standpoint ID - none currently needed  Multi-nodular goiter with tracheal deviation  HTN HLD CAD with dilation of pulm artery (on recent CTA of chest) Dyspnea on exertion BLE edema  I reviewed Consultant GI notes, hospitalist notes, last 24 h vitals and pain scores, last 48 h intake and output, last 24 h labs and trends, and last 24 h imaging results.  Henreitta Cea, [facility] Surgery 10/06/2021, 2:52 PM Please see Amion for pager number during day hours 7:00am-4:30pm or 7:00am -11:30am on weekends

## 2021-10-07 ENCOUNTER — Inpatient Hospital Stay (HOSPITAL_COMMUNITY): Payer: Medicare HMO

## 2021-10-07 DIAGNOSIS — R1013 Epigastric pain: Secondary | ICD-10-CM | POA: Diagnosis not present

## 2021-10-07 DIAGNOSIS — N179 Acute kidney failure, unspecified: Secondary | ICD-10-CM | POA: Diagnosis not present

## 2021-10-07 DIAGNOSIS — R748 Abnormal levels of other serum enzymes: Secondary | ICD-10-CM | POA: Diagnosis not present

## 2021-10-07 LAB — ECHOCARDIOGRAM COMPLETE
Area-P 1/2: 5.68 cm2
Calc EF: 64.2 %
Height: 64 in
MV VTI: 3.18 cm2
S' Lateral: 2.85 cm
Single Plane A2C EF: 66.5 %
Single Plane A4C EF: 60.9 %
Weight: 3495.61 oz

## 2021-10-07 LAB — HEPATIC FUNCTION PANEL
ALT: 351 U/L — ABNORMAL HIGH (ref 0–44)
AST: 303 U/L — ABNORMAL HIGH (ref 15–41)
Albumin: 3.1 g/dL — ABNORMAL LOW (ref 3.5–5.0)
Alkaline Phosphatase: 349 U/L — ABNORMAL HIGH (ref 38–126)
Bilirubin, Direct: 0.5 mg/dL — ABNORMAL HIGH (ref 0.0–0.2)
Indirect Bilirubin: 1.1 mg/dL — ABNORMAL HIGH (ref 0.3–0.9)
Total Bilirubin: 1.6 mg/dL — ABNORMAL HIGH (ref 0.3–1.2)
Total Protein: 6.7 g/dL (ref 6.5–8.1)

## 2021-10-07 LAB — BASIC METABOLIC PANEL
Anion gap: 5 (ref 5–15)
BUN: 30 mg/dL — ABNORMAL HIGH (ref 8–23)
CO2: 25 mmol/L (ref 22–32)
Calcium: 9.1 mg/dL (ref 8.9–10.3)
Chloride: 109 mmol/L (ref 98–111)
Creatinine, Ser: 1.74 mg/dL — ABNORMAL HIGH (ref 0.44–1.00)
GFR, Estimated: 31 mL/min — ABNORMAL LOW (ref >60)
Glucose, Bld: 88 mg/dL (ref 70–99)
Potassium: 4.7 mmol/L (ref 3.5–5.1)
Sodium: 139 mmol/L (ref 135–145)

## 2021-10-07 LAB — CBC
HCT: 27.2 % — ABNORMAL LOW (ref 36.0–46.0)
Hemoglobin: 8.5 g/dL — ABNORMAL LOW (ref 12.0–15.0)
MCH: 27.6 pg (ref 26.0–34.0)
MCHC: 31.3 g/dL (ref 30.0–36.0)
MCV: 88.3 fL (ref 80.0–100.0)
Platelets: 184 10*3/uL (ref 150–400)
RBC: 3.08 MIL/uL — ABNORMAL LOW (ref 3.87–5.11)
RDW: 15.3 % (ref 11.5–15.5)
WBC: 8.1 10*3/uL (ref 4.0–10.5)
nRBC: 0 % (ref 0.0–0.2)

## 2021-10-07 LAB — LIPASE, BLOOD: Lipase: 80 U/L — ABNORMAL HIGH (ref 11–51)

## 2021-10-07 MED ORDER — PIPERACILLIN-TAZOBACTAM 3.375 G IVPB
3.3750 g | Freq: Three times a day (TID) | INTRAVENOUS | Status: DC
  Administered 2021-10-07 – 2021-10-08 (×4): 3.375 g via INTRAVENOUS
  Filled 2021-10-07 (×4): qty 50

## 2021-10-07 MED ORDER — PERFLUTREN LIPID MICROSPHERE
1.0000 mL | INTRAVENOUS | Status: AC | PRN
  Administered 2021-10-07: 2 mL via INTRAVENOUS

## 2021-10-07 NOTE — Assessment & Plan Note (Signed)
Likely secondary to gallstone pancreatitis LFT's trending down Repeat LFT's in AM

## 2021-10-07 NOTE — Assessment & Plan Note (Signed)
Creatinine 1.85 on admission compared to prior 1.09 in July 2019.  Likely secondary to acute pancreatitis.   -Cr stable at 1.74 -Continue IV fluid hydration and repeat labs in a.m.

## 2021-10-07 NOTE — Progress Notes (Signed)
Hospitalist aware

## 2021-10-07 NOTE — Progress Notes (Signed)
Progress Note   Patient: Susan Nichols [id number]:[id number] DOB: Oct 07, 1950 DOA: 10/05/2021     2 DOS: the patient was seen and examined on 10/07/2021   Brief hospital course: Susan Nichols is a 71 y.o. female with medical history significant for history of CVA, HTN, HLD, multinodular goiter who is admitted with acute pancreatitis associated with transaminitis.  Susan Nichols ED Course  Labs/Imaging on admission: I have personally reviewed following labs and imaging studies.   Initial vitals showed BP 143/57, pulse 70, RR 18, temp 97.0 F, SPO2 100% on room air.   Labs show lipase 2651, AST 1164, ALT 612, alk phos 310, T. bili 1.5, BUN 35, creatinine 1.85, serum glucose 138, sodium 139, potassium 4.4, bicarb 23, WBC 10.1, hemoglobin 10.3, platelets 273,000.  Urinalysis negative for UTI.   CT abdomen/pelvis without contrast negative for radiographic signs of acute pancreatitis or other acute findings.  Cholelithiasis without evidence of cholecystitis or biliary ductal dilatation noted.   RUQ ultrasound negative for acute findings.  Cholelithiasis again noted without evidence of cholecystitis.  CBD diameter 3 mm.  MRCP also positive for cholelithiasis, no cholecystitis.  No choledocholithiasis or evidence of suggested biliary tract obstruction.Mild T2 hyperintensity adjacent to the pancreatic head. This is nonspecific, but could suggest acute inflammation. Correlation with lipase levels is recommended to exclude possible acute pancreatitis. Intrahepatic shunt with apparent communication between a branch of the right portal vein and the middle hepatic vein, as above.  5/30: Some improvement in AST and ALT, worsening of alkaline phosphatase to 371 and T. bili to 3.6.  Slight improvement of creatinine at 1.70, baseline around 1.3. GI consulted general surgery for laparoscopic cholecystectomy, general surgery wants cardiology clearance based on her recent CTA which shows aortic atherosclerosis.   Message sent to cardiology. Started on clear liquid and we will advance as tolerated.  Assessment and Plan: * Acute pancreatitis Transaminitis Lipase >2600 with AST 1164, ALT 612, alk phos 310, T. bili 1.5.  No prior history of pancreatitis.  CT A/P and RUQ U/S showed cholelithiasis without evidence of acute cholecystitis or CBD dilatation.  MRCP with concern of acute pancreatitis.  Patient reports symptoms began after eating ribs from a restaurant. General surgery was consulted, pending completion of cardiac clearance, plan for cholecystectomy 6/1 -IV antiemetics and analgesics as needed -Cardiology following for Cardiac clearance. Pending echo result  Elevated lipase Secondary to acute pancreatitis Trending down  AKI (acute kidney injury) ([place]) Creatinine 1.85 on admission compared to prior 1.09 in July 2019.  Likely secondary to acute pancreatitis.   -Cr stable at 1.74 -Continue IV fluid hydration and repeat labs in a.m.  Hypertension home meds were initially held as he was n.p.o. -Continued on home amlodipine, clonidine and metoprolol -currently holding lisinopril and chlorthalidone due to AKI -IV hydralazine as needed.  Thyroid goiter Patient has known large multinodular goiter with tracheal and esophageal deviation.  Recently evaluated by general surgery, Dr. Harlow Asa, without worrisome features. -would f/u as outpatient  History of CVA (cerebrovascular accident) Holding aspirin in anticipation for surgery - Statin also on hold with elevated LFTs.  Hyperlipemia Holding statin.  Elevated liver enzymes Likely secondary to gallstone pancreatitis LFT's trending down Repeat LFT's in AM  Anxiety -Seems stable at this time        Subjective: Tolerating current diet, however is still having mild abd discomfort. Eager to have surgery performed  Physical Exam: Vitals:   10/07/21 0620 10/07/21 0856 10/07/21 0910 10/07/21 1151  BP: (!) 131/43  (!) 153/53 Marland Kitchen)  144/53   Pulse: 94 89 88 79  Resp: (!) _0 Temp: (!) 101 F (38.3 C) 99.8 F (37.7 C)  99.5 F (37.5 C)  TempSrc: Oral Oral  Oral  SpO2: 97% 91%  94%  Weight:      Height:       General exam: Awake, laying in bed, in nad Respiratory system: Normal respiratory effort, no wheezing Cardiovascular system: regular rate, s1, s2 Gastrointestinal system: Soft, nondistended, positive BS Central nervous system: CN2-12 grossly intact, strength intact Extremities: Perfused, no clubbing Skin: Normal skin turgor, no notable skin lesions seen Psychiatry: Mood normal // no visual hallucinations   Data Reviewed:  Labs reviewed: K 4.7, Lipase 80, Total bili 1.6, Cr 1.74  Family Communication: Pt in room, family at bedside  Disposition: Status is: Inpatient Remains inpatient appropriate because: Severity of illness  Planned Discharge Destination: Home    Author: Marylu Lund, MD 10/07/2021 12:49 PM  For on call review [email or web address].

## 2021-10-07 NOTE — Assessment & Plan Note (Signed)
Patient has known large multinodular goiter with tracheal and esophageal deviation.  Recently evaluated by general surgery, Dr. Harlow Asa, without worrisome features. -would f/u as outpatient

## 2021-10-07 NOTE — Progress Notes (Signed)
  Transition of Care [facility]) Screening Note   Patient Details  Name: Jhada Risk Date of Birth: 1951/05/08   Transition of Care [facility]) CM/SW Contact:    Dessa Phi, RN Phone Number: 10/07/2021, 12:30 PM    Transition of Care Department [facility]) has reviewed patient and no TOC needs have been identified at this time. We will continue to monitor patient advancement through interdisciplinary progression rounds. If new patient transition needs arise, please place a TOC consult.

## 2021-10-07 NOTE — Progress Notes (Signed)
  Echocardiogram 2D Echocardiogram has been performed.  Bobbye Charleston 10/07/2021, 9:01 AM

## 2021-10-07 NOTE — Anesthesia Preprocedure Evaluation (Signed)
Anesthesia Evaluation  Patient identified by MRN, date of birth, ID band Patient awake    Reviewed: Allergy & Precautions, NPO status , Patient's Chart, lab work & pertinent test results  Airway Mallampati: II  TM Distance: >3 FB Neck ROM: Full   Comment: Pt w large Goiter on L neck. Able to lie flat no SOB Dental no notable dental hx. (+) Edentulous Upper, Edentulous Lower   Pulmonary shortness of breath,    Pulmonary exam normal breath sounds clear to auscultation       Cardiovascular hypertension, Pt. on medications Normal cardiovascular exam Rhythm:Regular Rate:Normal  10/07/2021 TTE  1. Left ventricular ejection fraction, by estimation, is 60 to 65%. The  left ventricle has normal function. The left ventricle has no regional  wall motion abnormalities. Left ventricular diastolic parameters are  consistent with Grade I diastolic  dysfunction (impaired relaxation).  2. Right ventricular systolic function is normal. The right ventricular  size is normal. There is normal pulmonary artery systolic pressure.  3. Left atrial size was moderately dilated.  4. The pericardial effusion is circumferential. There is no evidence of  cardiac tamponade.  5. The mitral valve is degenerative. Mild mitral valve regurgitation.  6. The aortic valve is tricuspid. There is mild calcification of the  aortic valve. There is mild thickening of the aortic valve. Aortic valve  regurgitation is not visualized. Aortic valve sclerosis is present, with  no evidence of aortic valve stenosis.  7. The inferior vena cava is dilated in size with >50% respiratory  variability, suggesting right atrial pressure of    Neuro/Psych CVA    GI/Hepatic Lab Results      Component                Value               Date                      ALT                      351 (H)             10/07/2021                AST                      303 (H)              10/07/2021                ALKPHOS                  349 (H)             10/07/2021                BILITOT                  1.6 (H)             10/07/2021              Endo/Other  Morbid obesity  Renal/GU Renal diseaseLab Results      Component                Value               Date  CREATININE               1.74 (H)            10/07/2021            K                        4.7                 10/07/2021                     Musculoskeletal   Abdominal   Peds  Hematology  (+) Blood dyscrasia, anemia , Lab Results      Component                Value               Date                      WBC                      8.1                 10/07/2021                HGB                      8.5 (L)             10/07/2021                HCT                      27.2 (L)            10/07/2021                MCV                      88.3                10/07/2021                PLT                      184                 10/07/2021              Anesthesia Other Findings Large Goiter   Reproductive/Obstetrics                            Anesthesia Physical Anesthesia Plan  ASA: 3  Anesthesia Plan: General   Post-op Pain Management:    Induction:   PONV Risk Score and Plan: Treatment may vary due to age or medical condition  Airway Management Planned: Video Laryngoscope Planned, Awake Intubation Planned, Fiberoptic Intubation Planned and Oral ETT  Additional Equipment: None  Intra-op Plan:   Post-operative Plan: Extubation in OR  Informed Consent: I have reviewed the patients History and Physical, chart, labs and discussed the procedure including the risks, benefits and alternatives for the proposed anesthesia with the patient or authorized representative who has indicated his/her understanding and acceptance.     Dental advisory given  Plan Discussed with:   Anesthesia Plan Comments:  Anesthesia Quick Evaluation  

## 2021-10-07 NOTE — Progress Notes (Signed)
Pharmacy Antibiotic Note  Susan Nichols is a 71 y.o. female admitted on 10/05/2021 with  IAI .  Pharmacy has been consulted for Zosyn dosing.  Plan: Zosyn 3.375g IV q8h (4 hour infusion).  Height: '5\' 4"'$  (162.6 cm) Weight: 99.1 kg (218 lb 7.6 oz) IBW/kg (Calculated) : 54.7  Temp (24hrs), Avg:100.1 F (37.8 C), Min:99.3 F (37.4 C), Max:101 F (38.3 C)  Recent Labs  Lab 10/05/21 1328 10/06/21 0440 10/07/21 0448  WBC 10.1 9.2 8.1  CREATININE 1.85* 1.70* 1.74*    Estimated Creatinine Clearance: 33.9 mL/min (A) (by C-G formula based on SCr of 1.74 mg/dL (H)).    No Known Allergies   Thank you for allowing pharmacy to be a part of this patient's care.  Kara Mead 10/07/2021 8:04 AM

## 2021-10-07 NOTE — Progress Notes (Addendum)
[facility] Gastroenterology Progress Note  Susan Nichols 71 y.o. Aug 05, 1950  CC:  Acute gallstone pancreatitis   Subjective: Patient states she feels "much better" compared to yesterday. Has been tolerating clear liquid diet well. Even had noodle soup yesterday with no nausea or vomiting. Reports improvement in her abdominal pain as well.  ROS : Review of Systems  Constitutional:  Negative for chills, fever and weight loss.  Gastrointestinal:  Negative for abdominal pain, blood in stool, constipation, diarrhea, heartburn, melena, nausea and vomiting.  Genitourinary:  Negative for dysuria and urgency.     Objective: Vital signs in last 24 hours: Vitals:   10/07/21 0856 10/07/21 0910  BP:  (!) 153/53  Pulse: 89 88  Resp: 16   Temp: 99.8 F (37.7 C)   SpO2: 91%     Physical Exam:  General:  Alert, cooperative, no distress, appears stated age  Head:  Normocephalic, without obvious abnormality, atraumatic  Eyes:  Anicteric sclera, EOM's intact  Lungs:   Clear to auscultation bilaterally, respirations unlabored  Heart:  Regular rate and rhythm, S1, S2 normal  Abdomen:   Soft, mild epigastric tenderness, bowel sounds active all four quadrants,  no masses,     Lab Results: Recent Labs    10/06/21 0440 10/07/21 0448  NA 140 139  K 4.6 4.7  CL 111 109  CO2 23 25  GLUCOSE 109* 88  BUN 34* 30*  CREATININE 1.70* 1.74*  CALCIUM 9.1 9.1  MG 2.2  --    Recent Labs    10/06/21 0440 10/07/21 0448  AST 751* 303*  ALT 581* 351*  ALKPHOS 371* 349*  BILITOT 3.6* 1.6*  PROT 6.9 6.7  ALBUMIN 3.4* 3.1*   Recent Labs    10/06/21 0440 10/07/21 0448  WBC 9.2 8.1  HGB 9.1* 8.5*  HCT 29.0* 27.2*  MCV 87.3 88.3  PLT 223 184   Recent Labs    10/06/21 0440  LABPROT 13.8  INR 1.1      Assessment Gallstone pancreatitis - MRCP 10/06/2021: Shows cholelithiasis, moderate distention of the gallbladder.  No choledocholithiasis.  Possible acute inflammation of the pancreatic  head.  Intrahepatic shunt with apparent communication between a branch of the right portal vein and middle hepatic vein - Lipase 2,651 -No leukocytosis -Hgb 8.5, MCV 88.3 -AST 303 (751)/ ALT 351 (581)/ Alk phos 349 (371) -T. bili 1.6 (3.6) -BUN 34, creatinine 1.70 - ECHO 2019: EF 60-65% (ECHO completed today pending) - temp 101 overnight, primary added zosyn    Plan: Continue supportive care with IV hydration. LFTs trending down and symptoms improving. Planned for cholecystectomy tomorrow with surgery pending cardiac clearance from repeat ECHO today. Continue diet as tolerated. [facility] will sign off. Please contact us if we can be of any further assistance during this hospital stay.   Garnette Scheuermann PA-C 10/07/2021, 9:14 AM  Contact #  [phone]

## 2021-10-07 NOTE — Assessment & Plan Note (Signed)
Transaminitis Lipase >2600 with AST 1164, ALT 612, alk phos 310, T. bili 1.5.  No prior history of pancreatitis.  CT A/P and RUQ U/S showed cholelithiasis without evidence of acute cholecystitis or CBD dilatation.  MRCP with concern of acute pancreatitis.  Patient reports symptoms began after eating ribs from a restaurant. General surgery was consulted, pending completion of cardiac clearance, plan for cholecystectomy 6/1 -IV antiemetics and analgesics as needed -Cardiology following for Cardiac clearance. Pending echo result

## 2021-10-07 NOTE — Assessment & Plan Note (Signed)
Holding statin 

## 2021-10-07 NOTE — Assessment & Plan Note (Signed)
home meds were initially held as he was n.p.o. -Continued on home amlodipine, clonidine and metoprolol -held lisinopril and chlorthalidone due to AKI -Blood pressure overall stable

## 2021-10-07 NOTE — Assessment & Plan Note (Signed)
Secondary to acute pancreatitis Trending down

## 2021-10-07 NOTE — Assessment & Plan Note (Signed)
Initially held aspirin in anticipation for surgery, would resume when okay with surgery - Statin held with elevated LFTs.

## 2021-10-07 NOTE — Progress Notes (Signed)
Subjective: No pain today.  Feeling better  ROS: See above, otherwise other systems negative  Objective: Vital signs in last 24 hours: Temp:  [99.3 F (37.4 C)-101 F (38.3 C)] 99.8 F (37.7 C) (05/31 0856) Pulse Rate:  [87-109] 88 (05/31 0910) Resp:  [16-22] 16 (05/31 0856) BP: (129-153)/(43-64) 153/53 (05/31 0910) SpO2:  [91 %-97 %] 91 % (05/31 0856) Last BM Date : 10/05/21  Intake/Output from previous day: 05/30 0701 - 05/31 0700 In: 2600.1 [P.O.:1440; I.V.:1160.1] Out: -  Intake/Output this shift: No intake/output data recorded.  PE: Gen: NAD Heart: regular, + murmur Abd: soft, NT, ND  Lab Results:  Recent Labs    10/06/21 0440 10/07/21 0448  WBC 9.2 8.1  HGB 9.1* 8.5*  HCT 29.0* 27.2*  PLT 223 184   BMET Recent Labs    10/06/21 0440 10/07/21 0448  NA 140 139  K 4.6 4.7  CL 111 109  CO2 23 25  GLUCOSE 109* 88  BUN 34* 30*  CREATININE 1.70* 1.74*  CALCIUM 9.1 9.1   PT/INR Recent Labs    10/06/21 0440  LABPROT 13.8  INR 1.1   CMP     Component Value Date/Time   NA 139 10/07/2021 0448   NA 141 04/24/2014 1115   K 4.7 10/07/2021 0448   K 3.9 04/24/2014 1115   CL 109 10/07/2021 0448   CO2 25 10/07/2021 0448   CO2 23 04/24/2014 1115   GLUCOSE 88 10/07/2021 0448   GLUCOSE 102 04/24/2014 1115   BUN 30 (H) 10/07/2021 0448   BUN 25.2 04/24/2014 1115   CREATININE 1.74 (H) 10/07/2021 0448   CREATININE 1.3 (H) 04/24/2014 1115   CALCIUM 9.1 10/07/2021 0448   CALCIUM 11.3 (H) 04/24/2014 1115   PROT 6.7 10/07/2021 0448   PROT 7.4 04/24/2014 1115   ALBUMIN 3.1 (L) 10/07/2021 0448   ALBUMIN 3.8 04/24/2014 1115   AST 303 (H) 10/07/2021 0448   AST 19 04/24/2014 1115   ALT 351 (H) 10/07/2021 0448   ALT 13 04/24/2014 1115   ALKPHOS 349 (H) 10/07/2021 0448   ALKPHOS 79 04/24/2014 1115   BILITOT 1.6 (H) 10/07/2021 0448   BILITOT 0.49 04/24/2014 1115   GFRNONAA 31 (L) 10/07/2021 0448   GFRAA 59 (L) 11/24/2017 1856   Lipase      Component Value Date/Time   LIPASE 80 (H) 10/07/2021 0448       Studies/Results: CT ABDOMEN PELVIS WO CONTRAST  Result Date: 10/05/2021 CLINICAL DATA:  Central abdominal pain beginning yesterday. Pancreatitis. EXAM: CT ABDOMEN AND PELVIS WITHOUT CONTRAST TECHNIQUE: Multidetector CT imaging of the abdomen and pelvis was performed following the standard protocol without IV contrast. RADIATION DOSE REDUCTION: This exam was performed according to the departmental dose-optimization program which includes automated exposure control, adjustment of the mA and/or kV according to patient size and/or use of iterative reconstruction technique. COMPARISON:  None Available. FINDINGS: Lower chest: No acute findings. Hepatobiliary: No mass visualized on this unenhanced exam. Numerous tiny calcified gallstones are seen, however there is no evidence of cholecystitis or biliary ductal dilatation. Pancreas: No mass or inflammatory process visualized on this unenhanced exam. Spleen:  Within normal limits in size. Adrenals/Urinary tract: No evidence of urolithiasis or hydronephrosis. Unremarkable unopacified urinary bladder. Stomach/Bowel: Small hiatal hernia noted. No evidence of obstruction, inflammatory process, or abnormal fluid collections. Vascular/Lymphatic: No pathologically enlarged lymph nodes identified. No evidence of abdominal aortic aneurysm. Aortic atherosclerotic calcification incidentally noted. Reproductive:  No mass or  other significant abnormality. Other:  None. Musculoskeletal:  No suspicious bone lesions identified. IMPRESSION: No radiographic signs of acute pancreatitis or other acute findings. Cholelithiasis. No radiographic evidence of cholecystitis. Small hiatal hernia. Electronically Signed   By: Marlaine Hind M.D.   On: 10/05/2021 16:48   MR ABDOMEN MRCP WO CONTRAST  Result Date: 10/06/2021 CLINICAL DATA:  71 year old female with history of right upper quadrant abdominal pain with nausea and  vomiting. Cholelithiasis noted on recent ultrasound examination. No prior abdominal MRI. EXAM: MRI ABDOMEN WITHOUT CONTRAST  (INCLUDING MRCP) TECHNIQUE: Multiplanar multisequence MR imaging of the abdomen was performed. Heavily T2-weighted images of the biliary and pancreatic ducts were obtained, and three-dimensional MRCP images were rendered by post processing. COMPARISON:  Right upper quadrant abdominal ultrasound 10/05/2021. CT the abdomen and pelvis 10/05/2021. FINDINGS: Comment: Today's study is limited for detection and characterization of visceral and/or vascular lesions by lack of IV gadolinium. Lower chest: Unremarkable. Hepatobiliary: Diffuse loss of signal intensity throughout the hepatic parenchyma on out of phase dual echo images, indicative of a background of hepatic steatosis. No definite suspicious appearing hepatic lesions are noted on today's noncontrast examination. There is what appears to be a flow void in the central aspect of the liver, likely direct communication between a branch of the right portal vein and the middle hepatic vein, suggesting an intrahepatic shunt. There are some filling defects lying dependently within the gallbladder, compatible with tiny gallstones. Gallbladder is moderately distended. Gallbladder wall thickness is normal. No pericholecystic fluid or overt surrounding inflammatory changes. MRCP images demonstrate no intra or extrahepatic biliary ductal dilatation. Common bile duct is normal in caliber measuring up to 6 mm in the porta hepatis. No filling defect in the common bile duct to suggest choledocholithiasis. Pancreas: No definite pancreatic mass identified on today's noncontrast examination. No pancreatic ductal dilatation. Mild T2 hyperintensity is noted adjacent to the pancreatic head, which could suggest inflammation. No well-defined peripancreatic fluid collections are noted. Spleen:  Unremarkable. Adrenals/Urinary Tract: Subcentimeter T1 hypointense, T2  hyperintense lesions are noted in the left kidney, incompletely characterize, but statistically likely to represent small cysts (no imaging follow-up is recommended). Right kidney and bilateral adrenal glands are normal in appearance. No hydroureteronephrosis in the visualized portions of the abdomen. Stomach/Bowel: Visualized portions are unremarkable. Vascular/Lymphatic: No aneurysm identified in the visualized abdominal vasculature. No lymphadenopathy noted in the abdomen. Other: No significant volume of ascites noted in the visualized portions of the peritoneal cavity. Musculoskeletal: No aggressive appearing osseous lesions are noted in the visualized portions of the skeleton. IMPRESSION: 1. Study is positive for cholelithiasis. There is moderate distension of the gallbladder, but no other overt imaging findings to clearly suggest an acute cholecystitis at this time. 2. No choledocholithiasis or evidence to suggest biliary tract obstruction. 3. Mild T2 hyperintensity adjacent to the pancreatic head. This is nonspecific, but could suggest acute inflammation. Correlation with lipase levels is recommended to exclude possible acute pancreatitis. 4. Intrahepatic shunt with apparent communication between a branch of the right portal vein and the middle hepatic vein, as above. Electronically Signed   By: Vinnie Langton M.D.   On: 10/06/2021 07:26   MR 3D Recon At Scanner  Result Date: 10/06/2021 CLINICAL DATA:  72 year old female with history of right upper quadrant abdominal pain with nausea and vomiting. Cholelithiasis noted on recent ultrasound examination. No prior abdominal MRI. EXAM: MRI ABDOMEN WITHOUT CONTRAST  (INCLUDING MRCP) TECHNIQUE: Multiplanar multisequence MR imaging of the abdomen was performed. Heavily T2-weighted images of the  biliary and pancreatic ducts were obtained, and three-dimensional MRCP images were rendered by post processing. COMPARISON:  Right upper quadrant abdominal ultrasound  10/05/2021. CT the abdomen and pelvis 10/05/2021. FINDINGS: Comment: Today's study is limited for detection and characterization of visceral and/or vascular lesions by lack of IV gadolinium. Lower chest: Unremarkable. Hepatobiliary: Diffuse loss of signal intensity throughout the hepatic parenchyma on out of phase dual echo images, indicative of a background of hepatic steatosis. No definite suspicious appearing hepatic lesions are noted on today's noncontrast examination. There is what appears to be a flow void in the central aspect of the liver, likely direct communication between a branch of the right portal vein and the middle hepatic vein, suggesting an intrahepatic shunt. There are some filling defects lying dependently within the gallbladder, compatible with tiny gallstones. Gallbladder is moderately distended. Gallbladder wall thickness is normal. No pericholecystic fluid or overt surrounding inflammatory changes. MRCP images demonstrate no intra or extrahepatic biliary ductal dilatation. Common bile duct is normal in caliber measuring up to 6 mm in the porta hepatis. No filling defect in the common bile duct to suggest choledocholithiasis. Pancreas: No definite pancreatic mass identified on today's noncontrast examination. No pancreatic ductal dilatation. Mild T2 hyperintensity is noted adjacent to the pancreatic head, which could suggest inflammation. No well-defined peripancreatic fluid collections are noted. Spleen:  Unremarkable. Adrenals/Urinary Tract: Subcentimeter T1 hypointense, T2 hyperintense lesions are noted in the left kidney, incompletely characterize, but statistically likely to represent small cysts (no imaging follow-up is recommended). Right kidney and bilateral adrenal glands are normal in appearance. No hydroureteronephrosis in the visualized portions of the abdomen. Stomach/Bowel: Visualized portions are unremarkable. Vascular/Lymphatic: No aneurysm identified in the visualized  abdominal vasculature. No lymphadenopathy noted in the abdomen. Other: No significant volume of ascites noted in the visualized portions of the peritoneal cavity. Musculoskeletal: No aggressive appearing osseous lesions are noted in the visualized portions of the skeleton. IMPRESSION: 1. Study is positive for cholelithiasis. There is moderate distension of the gallbladder, but no other overt imaging findings to clearly suggest an acute cholecystitis at this time. 2. No choledocholithiasis or evidence to suggest biliary tract obstruction. 3. Mild T2 hyperintensity adjacent to the pancreatic head. This is nonspecific, but could suggest acute inflammation. Correlation with lipase levels is recommended to exclude possible acute pancreatitis. 4. Intrahepatic shunt with apparent communication between a branch of the right portal vein and the middle hepatic vein, as above. Electronically Signed   By: Vinnie Langton M.D.   On: 10/06/2021 07:26   US Abdomen Limited RUQ (LIVER/GB)  Result Date: 10/05/2021 CLINICAL DATA:  RUQ/midline abd pain x 1 day with nausea and vomiting. HTN on meds. EXAM: ULTRASOUND ABDOMEN LIMITED RIGHT UPPER QUADRANT COMPARISON:  None Available. FINDINGS: Gallbladder: Moderately distended. Small dependent gallstones. No wall thickening. No pericholecystic fluid and no sonographic Murphy's sign. Common bile duct: Diameter: 3 mm. Liver: No focal lesion identified. Within normal limits in parenchymal echogenicity. Portal vein is patent on color Doppler imaging with normal direction of blood flow towards the liver. Other: None. IMPRESSION: 1. No acute findings. 2. Cholelithiasis without evidence of acute cholecystitis. Electronically Signed   By: Lajean Manes M.D.   On: 10/05/2021 17:04    Anti-infectives: Anti-infectives (From admission, onward)    Start     Dose/Rate Route Frequency Ordered Stop   10/07/21 1000  piperacillin-tazobactam (ZOSYN) IVPB 3.375 g        3.375 g 12.5 mL/hr over  240 Minutes Intravenous Every 8 hours 10/07/21  0805          Assessment/Plan Gallstone pancreatitis -HH diet today and await echo and cards recommendations -if cleared, NPO p MN for lap chole tomorrow -pre-op EKG completed -temp of 101 overnight, primary add zosyn -anesthesia aware of goiter -appreciate cards eval and recommends    FEN - HH/NPO p MN/IVFs VTE - heparin ID - zosyn started due to temp overnight   Multi-nodular goiter with tracheal deviation  HTN HLD CAD with dilation of pulm artery (on recent CTA of chest) Dyspnea on exertion BLE edema  I reviewed Consultant cardiology, anesthesia notes, hospitalist notes, last 24 h vitals and pain scores, last 48 h intake and output, last 24 h labs and trends, and last 24 h imaging results.   LOS: 2 days    Henreitta Cea , [facility] 10/07/2021, 9:13 AM Please see Amion for pager number during day hours 7:00am-4:30pm or 7:00am -11:30am on weekends

## 2021-10-07 NOTE — Assessment & Plan Note (Signed)
-  Seems stable at this time

## 2021-10-07 NOTE — Consult Note (Signed)
Ref: Susan Lei, MD   Subjective:  Awake. No chest pain.  VS stable. Echocardiogram with normal LV systolic function. LFTs are improving. Awaiting lap chole.  Objective:  Vital Signs in the last 24 hours: Temp:  [99 F (37.2 C)-101 F (38.3 C)] 99 F (37.2 C) (05/31 1546) Pulse Rate:  [79-94] 80 (05/31 1546) Cardiac Rhythm: Normal sinus rhythm (05/31 1205) Resp:  [18-22] 18 (05/31 1546) BP: (129-153)/(43-64) 131/49 (05/31 1546) SpO2:  [91 %-97 %] 92 % (05/31 1546)  Physical Exam: BP Readings from Last 1 Encounters:  10/07/21 (!) 131/49     Wt Readings from Last 1 Encounters:  10/06/21 99.1 kg    Weight change:  Body mass index is 37.5 kg/m. HEENT: [place]ck/AT, Eyes-Brown, Conjunctiva-Pink, Sclera-Non-icteric Neck: No JVD, No bruit, Trachea midline. Lungs:  Clear, Bilateral. Cardiac:  Regular rhythm, normal S1 and S2, no S3. II/VI systolic murmur. Abdomen:  Soft, non-tender. BS present. Extremities:  Trace edema present. No cyanosis. No clubbing. CNS: AxOx3, Cranial nerves grossly intact, moves all 4 extremities.  Skin: Warm and dry.   Intake/Output from previous day: 05/30 0701 - 05/31 0700 In: 2600.1 [P.O.:1440; I.V.:1160.1] Out: -     Lab Results: BMET    Component Value Date/Time   NA 139 10/07/2021 0448   NA 140 10/06/2021 0440   NA 139 10/05/2021 1328   NA 141 04/24/2014 1115   K 4.7 10/07/2021 0448   K 4.6 10/06/2021 0440   K 4.4 10/05/2021 1328   K 3.9 04/24/2014 1115   CL 109 10/07/2021 0448   CL 111 10/06/2021 0440   CL 105 10/05/2021 1328   CO2 25 10/07/2021 0448   CO2 23 10/06/2021 0440   CO2 23 10/05/2021 1328   CO2 23 04/24/2014 1115   GLUCOSE 88 10/07/2021 0448   GLUCOSE 109 (H) 10/06/2021 0440   GLUCOSE 138 (H) 10/05/2021 1328   GLUCOSE 102 04/24/2014 1115   BUN 30 (H) 10/07/2021 0448   BUN 34 (H) 10/06/2021 0440   BUN 35 (H) 10/05/2021 1328   BUN 25.2 04/24/2014 1115   CREATININE 1.74 (H) 10/07/2021 0448   CREATININE 1.70 (H)  10/06/2021 0440   CREATININE 1.85 (H) 10/05/2021 1328   CREATININE 1.3 (H) 04/24/2014 1115   CALCIUM 9.1 10/07/2021 0448   CALCIUM 9.1 10/06/2021 0440   CALCIUM 9.7 10/05/2021 1328   CALCIUM 11.3 (H) 04/24/2014 1115   GFRNONAA 31 (L) 10/07/2021 0448   GFRNONAA 32 (L) 10/06/2021 0440   GFRNONAA 29 (L) 10/05/2021 1328   GFRAA 59 (L) 11/24/2017 1856   CBC    Component Value Date/Time   WBC 8.1 10/07/2021 0448   RBC 3.08 (L) 10/07/2021 0448   HGB 8.5 (L) 10/07/2021 0448   HGB 11.1 (L) 09/04/2014 0932   HCT 27.2 (L) 10/07/2021 0448   HCT 33.7 (L) 09/04/2014 0932   PLT 184 10/07/2021 0448   PLT 208 09/04/2014 0932   MCV 88.3 10/07/2021 0448   MCV 84.7 09/04/2014 0932   MCH 27.6 10/07/2021 0448   MCHC 31.3 10/07/2021 0448   RDW 15.3 10/07/2021 0448   RDW 13.8 09/04/2014 0932   LYMPHSABS 1.9 09/04/2014 0932   MONOABS 0.5 09/04/2014 0932   EOSABS 0.1 09/04/2014 0932   BASOSABS 0.1 09/04/2014 0932   HEPATIC Function Panel Recent Labs    10/05/21 1328 10/06/21 0440 10/07/21 0448  PROT 7.6 6.9 6.7   HEMOGLOBIN A1C No components found for: HGA1C,  MPG CARDIAC ENZYMES Lab Results  Component Value  Date   CKTOTAL 370 (H) 03/09/2009   CKMB 2.7 03/09/2009   TROPONINI 0.02        NO INDICATION OF MYOCARDIAL INJURY. 03/09/2009   TROPONINI 0.02        NO INDICATION OF MYOCARDIAL INJURY. 03/09/2009   TROPONINI 0.03        NO INDICATION OF MYOCARDIAL INJURY. 03/09/2009   BNP No results for input(s): PROBNP in the last 8760 hours. TSH No results for input(s): TSH in the last 8760 hours. CHOLESTEROL No results for input(s): CHOL in the last 8760 hours.  Scheduled Meds:  amLODipine  10 mg Oral Daily   cloNIDine  0.2 mg Oral BID   heparin  5,000 Units Subcutaneous Q8H   hydrALAZINE  50 mg Oral BID   isosorbide mononitrate  60 mg Oral Daily   metoprolol tartrate  50 mg Oral BID   Continuous Infusions:  piperacillin-tazobactam (ZOSYN)  IV 3.375 g (10/07/21 0935)   PRN  Meds:.hydrALAZINE, HYDROmorphone (DILAUDID) injection, ondansetron **OR** ondansetron (ZOFRAN) IV  Assessment/Plan: Gall Stone Pancreatitis HTN HLD Morbid obesity S/P stroke BLE, resolving Multinodular goiter Exertional dyspnea  Plan: May undergo surgery with usual risks.   LOS: 2 days   Time spent including chart review, lab review, examination, discussion with patient : 30 min   Dixie Dials  MD  10/07/2021, 5:54 PM

## 2021-10-08 ENCOUNTER — Encounter (HOSPITAL_COMMUNITY): Payer: Self-pay | Admitting: Internal Medicine

## 2021-10-08 ENCOUNTER — Inpatient Hospital Stay (HOSPITAL_COMMUNITY): Payer: Medicare HMO | Admitting: Anesthesiology

## 2021-10-08 ENCOUNTER — Encounter (HOSPITAL_COMMUNITY)
Admission: EM | Disposition: A | Discharge status: Home / Self Care | Payer: Self-pay | Source: Home / Self Care | Attending: Internal Medicine

## 2021-10-08 ENCOUNTER — Other Ambulatory Visit: Payer: Self-pay

## 2021-10-08 ENCOUNTER — Inpatient Hospital Stay (HOSPITAL_COMMUNITY): Payer: Medicare HMO

## 2021-10-08 DIAGNOSIS — K851 Biliary acute pancreatitis without necrosis or infection: Secondary | ICD-10-CM

## 2021-10-08 DIAGNOSIS — R1013 Epigastric pain: Secondary | ICD-10-CM | POA: Diagnosis not present

## 2021-10-08 DIAGNOSIS — R748 Abnormal levels of other serum enzymes: Secondary | ICD-10-CM | POA: Diagnosis not present

## 2021-10-08 DIAGNOSIS — Z6838 Body mass index (BMI) 38.0-38.9, adult: Secondary | ICD-10-CM

## 2021-10-08 DIAGNOSIS — I10 Essential (primary) hypertension: Secondary | ICD-10-CM

## 2021-10-08 DIAGNOSIS — E669 Obesity, unspecified: Secondary | ICD-10-CM

## 2021-10-08 HISTORY — PX: CHOLECYSTECTOMY: SHX55

## 2021-10-08 LAB — BLOOD CULTURE ID PANEL (REFLEXED) - BCID2

## 2021-10-08 LAB — COMPREHENSIVE METABOLIC PANEL
ALT: 259 U/L — ABNORMAL HIGH (ref 0–44)
AST: 145 U/L — ABNORMAL HIGH (ref 15–41)
Albumin: 3.4 g/dL — ABNORMAL LOW (ref 3.5–5.0)
Alkaline Phosphatase: 322 U/L — ABNORMAL HIGH (ref 38–126)
Anion gap: 11 (ref 5–15)
BUN: 27 mg/dL — ABNORMAL HIGH (ref 8–23)
CO2: 22 mmol/L (ref 22–32)
Calcium: 9.2 mg/dL (ref 8.9–10.3)
Chloride: 106 mmol/L (ref 98–111)
Creatinine, Ser: 1.69 mg/dL — ABNORMAL HIGH (ref 0.44–1.00)
GFR, Estimated: 32 mL/min — ABNORMAL LOW (ref >60)
Glucose, Bld: 97 mg/dL (ref 70–99)
Potassium: 4.1 mmol/L (ref 3.5–5.1)
Sodium: 139 mmol/L (ref 135–145)
Total Bilirubin: 1.3 mg/dL — ABNORMAL HIGH (ref 0.3–1.2)
Total Protein: 7.4 g/dL (ref 6.5–8.1)

## 2021-10-08 LAB — CBC
HCT: 29.5 % — ABNORMAL LOW (ref 36.0–46.0)
Hemoglobin: 9.4 g/dL — ABNORMAL LOW (ref 12.0–15.0)
MCH: 27.9 pg (ref 26.0–34.0)
MCHC: 31.9 g/dL (ref 30.0–36.0)
MCV: 87.5 fL (ref 80.0–100.0)
Platelets: 202 10*3/uL (ref 150–400)
RBC: 3.37 MIL/uL — ABNORMAL LOW (ref 3.87–5.11)
RDW: 15.2 % (ref 11.5–15.5)
WBC: 9 10*3/uL (ref 4.0–10.5)
nRBC: 0 % (ref 0.0–0.2)

## 2021-10-08 SURGERY — LAPAROSCOPIC CHOLECYSTECTOMY WITH INTRAOPERATIVE CHOLANGIOGRAM
Anesthesia: General

## 2021-10-08 MED ORDER — DEXAMETHASONE SODIUM PHOSPHATE 10 MG/ML IJ SOLN
INTRAMUSCULAR | Status: AC
  Filled 2021-10-08: qty 1

## 2021-10-08 MED ORDER — FENTANYL CITRATE PF 50 MCG/ML IJ SOSY
PREFILLED_SYRINGE | INTRAMUSCULAR | Status: AC
  Filled 2021-10-08: qty 1

## 2021-10-08 MED ORDER — PIPERACILLIN-TAZOBACTAM 3.375 G IVPB
3.3750 g | Freq: Three times a day (TID) | INTRAVENOUS | Status: DC
  Administered 2021-10-08 – 2021-10-09 (×3): 3.375 g via INTRAVENOUS
  Filled 2021-10-08 (×3): qty 50

## 2021-10-08 MED ORDER — BUPIVACAINE HCL (PF) 0.5 % IJ SOLN
INTRAMUSCULAR | Status: DC | PRN
  Administered 2021-10-08: 20 mL

## 2021-10-08 MED ORDER — IOHEXOL 300 MG/ML  SOLN
INTRAMUSCULAR | Status: DC | PRN
  Administered 2021-10-08: 6 mL

## 2021-10-08 MED ORDER — BUPIVACAINE HCL (PF) 0.5 % IJ SOLN
INTRAMUSCULAR | Status: AC
Start: 2021-10-08 — End: ?
  Filled 2021-10-08: qty 30

## 2021-10-08 MED ORDER — LIDOCAINE HCL (PF) 2 % IJ SOLN
INTRAMUSCULAR | Status: AC
  Filled 2021-10-08: qty 5

## 2021-10-08 MED ORDER — FENTANYL CITRATE (PF) 100 MCG/2ML IJ SOLN
INTRAMUSCULAR | Status: DC | PRN
  Administered 2021-10-08: 25 ug via INTRAVENOUS
  Administered 2021-10-08: 75 ug via INTRAVENOUS
  Administered 2021-10-08: 50 ug via INTRAVENOUS

## 2021-10-08 MED ORDER — ONDANSETRON HCL 4 MG/2ML IJ SOLN
INTRAMUSCULAR | Status: DC | PRN
  Administered 2021-10-08: 4 mg via INTRAVENOUS

## 2021-10-08 MED ORDER — PROPOFOL 10 MG/ML IV BOLUS
INTRAVENOUS | Status: AC
  Filled 2021-10-08: qty 20

## 2021-10-08 MED ORDER — LACTATED RINGERS IR SOLN
Status: DC | PRN
  Administered 2021-10-08: 1000 mL

## 2021-10-08 MED ORDER — POLYETHYLENE GLYCOL 3350 17 G PO PACK: 17.0000 g | PACK | Freq: Every day | ORAL | Status: DC | PRN

## 2021-10-08 MED ORDER — DEXAMETHASONE SODIUM PHOSPHATE 10 MG/ML IJ SOLN
INTRAMUSCULAR | Status: DC | PRN
  Administered 2021-10-08: 10 mg via INTRAVENOUS

## 2021-10-08 MED ORDER — CHLORHEXIDINE GLUCONATE 0.12 % MT SOLN
15.0000 mL | Freq: Once | OROMUCOSAL | Status: AC
  Administered 2021-10-08: 15 mL via OROMUCOSAL

## 2021-10-08 MED ORDER — LIDOCAINE HCL 4 % EX SOLN
CUTANEOUS | Status: DC | PRN
  Administered 2021-10-08 (×2): 10 mL via TOPICAL

## 2021-10-08 MED ORDER — DOCUSATE SODIUM 100 MG PO CAPS: 100.0000 mg | ORAL_CAPSULE | Freq: Two times a day (BID) | ORAL | Status: DC

## 2021-10-08 MED ORDER — LIDOCAINE HCL (PF) 4 % IJ SOLN
10.0000 mL | Freq: Once | INTRAMUSCULAR | Status: DC
  Filled 2021-10-08: qty 10

## 2021-10-08 MED ORDER — ACETAMINOPHEN 325 MG PO TABS
650.0000 mg | ORAL_TABLET | Freq: Once | ORAL | Status: AC
  Administered 2021-10-08: 650 mg via ORAL
  Filled 2021-10-08: qty 2

## 2021-10-08 MED ORDER — SUGAMMADEX SODIUM 200 MG/2ML IV SOLN
INTRAVENOUS | Status: DC | PRN
  Administered 2021-10-08: 300 mg via INTRAVENOUS

## 2021-10-08 MED ORDER — SUGAMMADEX SODIUM 500 MG/5ML IV SOLN
INTRAVENOUS | Status: AC
  Filled 2021-10-08: qty 5

## 2021-10-08 MED ORDER — OXYCODONE HCL 5 MG PO TABS
5.0000 mg | ORAL_TABLET | ORAL | Status: DC | PRN
  Administered 2021-10-08: 10 mg via ORAL
  Administered 2021-10-08 – 2021-10-09 (×3): 5 mg via ORAL
  Filled 2021-10-08: qty 1
  Filled 2021-10-08: qty 2
  Filled 2021-10-08: qty 1
  Filled 2021-10-08: qty 2

## 2021-10-08 MED ORDER — ACETAMINOPHEN 325 MG PO TABS: 650.0000 mg | ORAL_TABLET | Freq: Four times a day (QID) | ORAL | Status: DC | PRN

## 2021-10-08 MED ORDER — LACTATED RINGERS IV SOLN: INTRAVENOUS | Status: DC

## 2021-10-08 MED ORDER — DEXMEDETOMIDINE (PRECEDEX) IN NS 20 MCG/5ML (4 MCG/ML) IV SYRINGE
PREFILLED_SYRINGE | INTRAVENOUS | Status: DC | PRN
  Administered 2021-10-08: 8 ug via INTRAVENOUS

## 2021-10-08 MED ORDER — FENTANYL CITRATE (PF) 250 MCG/5ML IJ SOLN
INTRAMUSCULAR | Status: AC
  Filled 2021-10-08: qty 5

## 2021-10-08 MED ORDER — ROCURONIUM BROMIDE 10 MG/ML (PF) SYRINGE
PREFILLED_SYRINGE | INTRAVENOUS | Status: AC
  Filled 2021-10-08: qty 10

## 2021-10-08 MED ORDER — FENTANYL CITRATE PF 50 MCG/ML IJ SOSY
25.0000 ug | PREFILLED_SYRINGE | INTRAMUSCULAR | Status: DC | PRN
  Administered 2021-10-08: 25 ug via INTRAVENOUS

## 2021-10-08 MED ORDER — HYDROMORPHONE HCL 1 MG/ML IJ SOLN
0.5000 mg | INTRAMUSCULAR | Status: DC | PRN
  Administered 2021-10-08 – 2021-10-09 (×2): 0.5 mg via INTRAVENOUS
  Filled 2021-10-08 (×2): qty 0.5

## 2021-10-08 MED ORDER — ROCURONIUM BROMIDE 10 MG/ML (PF) SYRINGE
PREFILLED_SYRINGE | INTRAVENOUS | Status: DC | PRN
  Administered 2021-10-08: 50 mg via INTRAVENOUS

## 2021-10-08 MED ORDER — ONDANSETRON HCL 4 MG/2ML IJ SOLN
INTRAMUSCULAR | Status: AC
  Filled 2021-10-08: qty 2

## 2021-10-08 MED ORDER — DOCUSATE SODIUM 100 MG PO CAPS
100.0000 mg | ORAL_CAPSULE | Freq: Two times a day (BID) | ORAL | Status: DC
  Administered 2021-10-08 – 2021-10-09 (×3): 100 mg via ORAL
  Filled 2021-10-08 (×3): qty 1

## 2021-10-08 MED ORDER — MIDAZOLAM HCL 2 MG/2ML IJ SOLN
INTRAMUSCULAR | Status: AC
  Filled 2021-10-08: qty 2

## 2021-10-08 MED ORDER — DEXMEDETOMIDINE (PRECEDEX) IN NS 20 MCG/5ML (4 MCG/ML) IV SYRINGE
PREFILLED_SYRINGE | INTRAVENOUS | Status: AC
  Filled 2021-10-08: qty 5

## 2021-10-08 MED ORDER — PROPOFOL 10 MG/ML IV BOLUS
INTRAVENOUS | Status: DC | PRN
  Administered 2021-10-08: 130 mg via INTRAVENOUS

## 2021-10-08 MED ORDER — SODIUM CHLORIDE 0.9 % IR SOLN
Status: DC | PRN
  Administered 2021-10-08: 1000 mL

## 2021-10-08 MED ORDER — MIDAZOLAM HCL 5 MG/5ML IJ SOLN
INTRAMUSCULAR | Status: DC | PRN
  Administered 2021-10-08 (×2): 1 mg via INTRAVENOUS

## 2021-10-08 SURGICAL SUPPLY — 36 items
APPLIER CLIP 5 13 M/L LIGAMAX5 (MISCELLANEOUS) ×2
BAG COUNTER SPONGE SURGICOUNT (BAG) ×1 IMPLANT
BAG RETRIEVAL 10 (BASKET) ×1
CABLE HIGH FREQUENCY MONO STRZ (ELECTRODE) ×2 IMPLANT
CHLORAPREP W/TINT 26 (MISCELLANEOUS) ×2 IMPLANT
CLIP APPLIE 5 13 M/L LIGAMAX5 (MISCELLANEOUS) ×1 IMPLANT
COVER MAYO STAND XLG (MISCELLANEOUS) ×2 IMPLANT
DERMABOND ADVANCED (GAUZE/BANDAGES/DRESSINGS) ×1
DERMABOND ADVANCED .7 DNX12 (GAUZE/BANDAGES/DRESSINGS) ×1 IMPLANT
DRAPE C-ARM 42X120 X-RAY (DRAPES) ×1 IMPLANT
ELECT REM PT RETURN 15FT ADLT (MISCELLANEOUS) ×2 IMPLANT
ENDOLOOP SUT PDS II  0 18 (SUTURE) ×2
ENDOLOOP SUT PDS II 0 18 (SUTURE) IMPLANT
GLOVE BIO SURGEON STRL SZ7.5 (GLOVE) ×2 IMPLANT
GOWN STRL REUS W/ TWL XL LVL3 (GOWN DISPOSABLE) ×2 IMPLANT
GOWN STRL REUS W/TWL XL LVL3 (GOWN DISPOSABLE) ×4
HEMOSTAT SNOW SURGICEL 2X4 (HEMOSTASIS) ×1 IMPLANT
HEMOSTAT SURGICEL 4X8 (HEMOSTASIS) IMPLANT
IRRIG SUCT STRYKERFLOW 2 WTIP (MISCELLANEOUS) ×2
IRRIGATION SUCT STRKRFLW 2 WTP (MISCELLANEOUS) ×1 IMPLANT
KIT BASIN OR (CUSTOM PROCEDURE TRAY) ×2 IMPLANT
KIT TURNOVER KIT A (KITS) ×1 IMPLANT
PENCIL SMOKE EVACUATOR (MISCELLANEOUS) IMPLANT
SCISSORS LAP 5X35 DISP (ENDOMECHANICALS) ×2 IMPLANT
SET CHOLANGIOGRAPH MIX (MISCELLANEOUS) ×1 IMPLANT
SET TUBE SMOKE EVAC HIGH FLOW (TUBING) ×2 IMPLANT
SLEEVE Z-THREAD 5X100MM (TROCAR) ×4 IMPLANT
SPIKE FLUID TRANSFER (MISCELLANEOUS) ×2 IMPLANT
SUT MNCRL AB 4-0 PS2 18 (SUTURE) ×2 IMPLANT
SYS BAG RETRIEVAL 10MM (BASKET) ×1
SYSTEM BAG RETRIEVAL 10MM (BASKET) ×1 IMPLANT
TOWEL OR 17X26 10 PK STRL BLUE (TOWEL DISPOSABLE) ×2 IMPLANT
TOWEL OR NON WOVEN STRL DISP B (DISPOSABLE) ×2 IMPLANT
TRAY LAPAROSCOPIC (CUSTOM PROCEDURE TRAY) ×2 IMPLANT
TROCAR BALLN 12MMX100 BLUNT (TROCAR) ×2 IMPLANT
TROCAR Z-THREAD OPTICAL 5X100M (TROCAR) ×2 IMPLANT

## 2021-10-08 NOTE — Anesthesia Postprocedure Evaluation (Signed)
Anesthesia Post Note  Patient: Susan Nichols  Procedure(s) Performed: LAPAROSCOPIC CHOLECYSTECTOMY WITH INTRAOPERATIVE CHOLANGIOGRAM     Patient location during evaluation: PACU Anesthesia Type: General Level of consciousness: awake and alert Pain management: pain level controlled Vital Signs Assessment: post-procedure vital signs reviewed and stable Respiratory status: spontaneous breathing, nonlabored ventilation, respiratory function stable and patient connected to nasal cannula oxygen Cardiovascular status: blood pressure returned to baseline and stable Postop Assessment: no apparent nausea or vomiting Anesthetic complications: no   No notable events documented.  Last Vitals:  Vitals:   10/08/21 1146 10/08/21 1444  BP: (!) 166/68 (!) 156/62  Pulse: 84   Resp: 14   Temp: 37.2 C   SpO2: 95%     Last Pain:  Vitals:   10/08/21 1336  TempSrc:   PainSc: 5                  Barnet Glasgow

## 2021-10-08 NOTE — Progress Notes (Signed)
Patient ID: Susan Nichols, female   DOB: [birth year], 71 y.o.   MRN: [id number]   Pre Procedure note for inpatients:   Susan Nichols has been scheduled for Procedure(s): LAPAROSCOPIC CHOLECYSTECTOMY WITH INTRAOPERATIVE CHOLANGIOGRAM (N/A) today. The various methods of treatment have been discussed with the patient. After consideration of the risks, benefits and treatment options the patient has consented to the planned procedure.   The patient has been seen and labs reviewed. There are no changes in the patient's condition to prevent proceeding with the planned procedure today.  Recent labs:  Lab Results  Component Value Date   WBC 9.0 10/08/2021   HGB 9.4 (L) 10/08/2021   HCT 29.5 (L) 10/08/2021   PLT 202 10/08/2021   GLUCOSE 97 10/08/2021   CHOL 113 11/25/2017   TRIG 34 10/05/2021   HDL 35 (L) 11/25/2017   LDLCALC 58 11/25/2017   ALT 259 (H) 10/08/2021   AST 145 (H) 10/08/2021   NA 139 10/08/2021   K 4.1 10/08/2021   CL 106 10/08/2021   CREATININE 1.69 (H) 10/08/2021   BUN 27 (H) 10/08/2021   CO2 22 10/08/2021   TSH 0.502 11/25/2017   INR 1.1 10/06/2021   HGBA1C 5.9 (H) 11/25/2017    Coralie Keens, MD 10/08/2021 6:51 AM

## 2021-10-08 NOTE — Progress Notes (Signed)
[place] for report from Milderd Meager, Susan Nichols is alert and oriented, can come by stretcher to Short Stay.  Asked to give the 6 CHG wipes to use before coming to Short Stay, give her beta blocker, remove all clothing and personal items rings, watch and dentures.  IV is in the left upper  arm. Made aware the Short Stay will come for Susan Nichols around [id number].

## 2021-10-08 NOTE — Op Note (Signed)
Susan Nichols 10/08/2021   Pre-op Diagnosis: gallbladder pancreatitis     Post-op Diagnosis: game  Procedure(s): LAPAROSCOPIC CHOLECYSTECTOMY  ATTEMPTED INTRAOPERATIVE CHOLANGIOGRAM  Surgeon(s): Coralie Keens, MD  Anesthesia: General  Staff:  Circulator: Thana Ates, RN Physician Assistant: Saverio Danker, PA-C Relief Scrub: Alena Bills, RN Scrub Person: Jilda Roche M, CST  Estimated Blood Loss: Minimal               Specimens: sent to path  Indications: This is a 71 year old female who presented with gallstone pancreatitis.  Her pancreatitis is improved along with the liver function tests so decision has been made to proceed to the operating room for cholecystectomy  Findings: The patient found to have an acutely inflamed gallbladder with gallstones.  A cholangiogram was attempted but contrast Leaking around the cholangiocatheter with only some aching down the bile duct.  Given the tenuous nature of the cystic stump the decision was made to abort the cholangiogram.  Procedure: The patient is brought to operating identifies correct patient.  She is placed upon the operating table and general anesthesia was induced.  Her abdomen was then prepped and draped in usual sterile fashion.  I made a small vertical incision below the umbilicus with a scalpel.  I carried this down to the fascia which was then opened with a scalpel as well.  Hemostat was then used to pass to the peritoneal cavity under direct vision.  A 0 Vicryl pursestring suture was placed around the fascial opening.  The [facility] port was placed the opening and insufflation of the abdomen was begun.  I next placed a 5 mm trocar in the patient's epigastrium and 2 more in the right upper quadrant all under direct vision.  The patient had a very large left lobe of her liver.  I was able to tilt the table enough to move the left lobe out of the way.  The gallbladder was distended and appeared to be inflamed.   It was grasped and retracted above the liver bed.  Several lesions of the gallbladder were then taken down the base.  A small bridging vessel was clipped with surgical ellipse and transected.  I was then able to dissect out the cystic duct and cystic artery and achieved a critical window around both.  The artery was clipped twice proximally once distally.  The cystic duct was also clipped once distally and then opened the laparoscopic scissors.  I placed a cholangiocatheter through a small incision in the right upper quadrant under direct vision.  I was able to get the cholangiocatheter into the small opening the cystic duct but despite several clips still has some leaking around it.  I attempted a cholangiogram but contrast continued to leak out around the catheter.  Some the contrast to go down into the common duct.  I was, however, unable to complete the cholangiogram and for fear of tearing the tenuous cystic stump, the decision was made to discontinue cholangiogram.  I was then able to close the cystic stump with a surgical clip and an Endoloop.  We then transected the cystic artery.  The gallbladder was then slowly dissected free from the liver bed with the electrocautery.  Once it was freed from the liver bed hemostasis peer to be achieved with the cautery and a piece of surgical snow as well as a small tear in the left lobe liver.  The gallbladder is placed in an Endosac.  Several stones and fallen out and were suctioned out  with a large sucker.  The gallbladder was removed and then dissected the umbilical incision.  We again irrigated the abdomen with saline.  Liver bed was again evaluated and hemostasis felt to be achieved.  All ports were removed and direct vision the abdomen was deflated.  The 0 Vicryl at the umbilicus was tied in place closing the fascial defect.  All incisions were then anesthetized with Marcaine and closed with 4-0 Monocryl sutures.  Dermabond was then applied.  The patient tolerated  the procedure well.  All the counts were correct at the end of the procedure.  The patient was then extubated in the operating room and taken in stable condition to the recovery room.          Coralie Keens   Date: 10/08/2021  Time: 10:09 AM

## 2021-10-08 NOTE — Progress Notes (Signed)
Progress Note   Patient: Susan Nichols MWU:[id number] DOB: [birth year] DOA: 10/05/2021     3 DOS: the patient was seen and examined on 10/08/2021   Brief hospital course: Susan Nichols is a 71 y.o. female with medical history significant for history of CVA, HTN, HLD, multinodular goiter who is admitted with acute pancreatitis associated with transaminitis.  [facility] ED Course  Labs/Imaging on admission: I have personally reviewed following labs and imaging studies.   Initial vitals showed BP 143/57, pulse 70, RR 18, temp 97.0 F, SPO2 100% on room air.   Labs show lipase 2651, AST 1164, ALT 612, alk phos 310, T. bili 1.5, BUN 35, creatinine 1.85, serum glucose 138, sodium 139, potassium 4.4, bicarb 23, WBC 10.1, hemoglobin 10.3, platelets 273,000.  Urinalysis negative for UTI.   CT abdomen/pelvis without contrast negative for radiographic signs of acute pancreatitis or other acute findings.  Cholelithiasis without evidence of cholecystitis or biliary ductal dilatation noted.   RUQ ultrasound negative for acute findings.  Cholelithiasis again noted without evidence of cholecystitis.  CBD diameter 3 mm.  MRCP also positive for cholelithiasis, no cholecystitis.  No choledocholithiasis or evidence of suggested biliary tract obstruction.Mild T2 hyperintensity adjacent to the pancreatic head. This is nonspecific, but could suggest acute inflammation. Correlation with lipase levels is recommended to exclude possible acute pancreatitis. Intrahepatic shunt with apparent communication between a branch of the right portal vein and the middle hepatic vein, as above.  5/30: Some improvement in AST and ALT, worsening of alkaline phosphatase to 371 and T. bili to 3.6.  Slight improvement of creatinine at 1.70, baseline around 1.3. GI consulted general surgery for laparoscopic cholecystectomy, general surgery wants cardiology clearance based on her recent CTA which shows aortic atherosclerosis.   Message sent to cardiology. Started on clear liquid and we will advance as tolerated.  Assessment and Plan: * Acute pancreatitis Transaminitis Lipase >2600 with AST 1164, ALT 612, alk phos 310, T. bili 1.5.  No prior history of pancreatitis.  CT A/P and RUQ U/S showed cholelithiasis without evidence of acute cholecystitis or CBD dilatation.  MRCP with concern of acute pancreatitis.  Patient reports symptoms began after eating ribs from a restaurant. General surgery was consulted and patient has since undergone cholecystectomy on 10/08/2021 -Continue with analgesia as needed  Elevated lipase Secondary to acute pancreatitis Lipase had trended down   AKI (acute kidney injury) ([facility]) Creatinine 1.85 on admission compared to prior 1.09 in July 2019.  Likely secondary to acute pancreatitis.   -Cr stable at 1.69 -Patient has been continued on IV fluid hydration -Repeat basic metabolic panel in the morning.  Hypertension home meds were initially held as he was n.p.o. -Continued on home amlodipine, clonidine and metoprolol -currently holding lisinopril and chlorthalidone due to AKI -IV hydralazine as needed. -Blood pressure overall stable  Thyroid goiter Patient has known large multinodular goiter with tracheal and esophageal deviation.  Recently evaluated by general surgery, Dr. Harlow Asa, without worrisome features. -would f/u as outpatient  History of CVA (cerebrovascular accident) Initially held aspirin in anticipation for surgery, would resume when okay with surgery - Statin also on hold with elevated LFTs.  Hyperlipemia Holding statin.  Elevated liver enzymes Likely secondary to gallstone pancreatitis LFT's trending down Recheck LFTs in the morning  Anxiety -Seems stable at this time        Subjective: Complaining of post-op abd pain. Pt seen after surgery  Physical Exam: Vitals:   10/08/21 1100 10/08/21 1115 10/08/21 1146 10/08/21 1444  BP: Marland Kitchen)  175/71 (!) 175/67 (!)  166/68 (!) 156/62  Pulse: 80 83 84   Resp: 12 16 14    Temp:  98.5 F (36.9 C) 99 F (37.2 C)   TempSrc:   Oral   SpO2: 99% 94% 95%   Weight:      Height:       General exam: Conversant, in no acute distress Respiratory system: normal chest rise, clear, no audible wheezing Cardiovascular system: regular rhythm, s1-s2 Gastrointestinal system: Nondistended, nontender, pos BS Central nervous system: No seizures, no tremors Extremities: No cyanosis, no joint deformities Skin: No rashes, no pallor Psychiatry: Affect normal // no auditory hallucinations    Data Reviewed:  Labs reviewed: Potassium 4.1, creatinine 1.69, alk phos 322, AST 145, ALT 259, hemoglobin 9.4, WBC 9.0  Family Communication: Pt in room, family at bedside  Disposition: Status is: Inpatient Remains inpatient appropriate because: Severity of illness  Planned Discharge Destination: Home    Author: Marylu Lund, MD 10/08/2021 5:30 PM  For on call review [email or web address].

## 2021-10-08 NOTE — Transfer of Care (Signed)
Immediate Anesthesia Transfer of Care Note  Patient: Susan Nichols  Procedure(s) Performed: LAPAROSCOPIC CHOLECYSTECTOMY WITH INTRAOPERATIVE CHOLANGIOGRAM  Patient Location: PACU  Anesthesia Type:General  Level of Consciousness: awake, alert  and oriented  Airway & Oxygen Therapy: Patient Spontanous Breathing and Patient connected to face mask oxygen  Post-op Assessment: Report given to RN and Post -op Vital signs reviewed and stable  Post vital signs: Reviewed and stable  Last Vitals:  Vitals Value Taken Time  BP 163/83 10/08/21 1025  Temp    Pulse 89 10/08/21 1031  Resp 17 10/08/21 1031  SpO2 100 % 10/08/21 1031  Vitals shown include unvalidated device data.  Last Pain:  Vitals:   10/08/21 0728  TempSrc:   PainSc: 4       Patients Stated Pain Goal: 2 (81/85/63 1497)  Complications: No notable events documented.

## 2021-10-08 NOTE — Anesthesia Procedure Notes (Signed)
Procedure Name: Intubation Date/Time: 10/08/2021 9:07 AM Performed by: Maxwell Caul, CRNA Pre-anesthesia Checklist: Patient identified, Emergency Drugs available, Suction available and Patient being monitored Patient Re-evaluated:Patient Re-evaluated prior to induction Oxygen Delivery Method: Circle system utilized Preoxygenation: Pre-oxygenation with 100% oxygen Induction Type: IV induction and Combination inhalational/ intravenous induction Laryngoscope Size: Glidescope and 4 Grade View: Grade I Tube type: Oral Tube size: 7.5 mm Number of attempts: 1 Airway Equipment and Method: Stylet Placement Confirmation: ETT inserted through vocal cords under direct vision, positive ETCO2 and breath sounds checked- equal and bilateral Secured at: 21 cm Tube secured with: Tape Dental Injury: Teeth and Oropharynx as per pre-operative assessment  Difficulty Due To: Difficulty was anticipated Future Recommendations: Recommend- induction with short-acting agent, and alternative techniques readily available and Recommend- awake intubation Comments: Due to known airway concerns, anesthesia plan for is for an awake intubation. After pre-medication, Patient spontaneously breathing and DLx 1 with Glidescpe 4. Noted airway deviation but grade 1 view observed with Glidescope view.  ETT 7.0 gently passed through cords and placement confirmed.

## 2021-10-08 NOTE — Discharge Instructions (Signed)
[facility]  Please arrive at least 30 min before your appointment to complete your check in paperwork.  If you are unable to arrive 30 min prior to your appointment time we may have to cancel or reschedule you. LAPAROSCOPIC SURGERY: POST OP INSTRUCTIONS Always review your discharge instruction sheet given to you by the facility where your surgery was performed. IF YOU HAVE DISABILITY OR FAMILY LEAVE FORMS, YOU MUST BRING THEM TO THE OFFICE FOR PROCESSING.   DO NOT GIVE THEM TO YOUR DOCTOR.  PAIN CONTROL  First take acetaminophen (Tylenol) AND/or ibuprofen (Advil) to control your pain after surgery.  Follow directions on package.  Taking acetaminophen (Tylenol) and/or ibuprofen (Advil) regularly after surgery will help to control your pain and lower the amount of prescription pain medication you may need.  You should not take more than 4,000 mg (4 grams) of acetaminophen (Tylenol) in 24 hours.  You should not take ibuprofen (Advil), aleve, motrin, naprosyn or other NSAIDS if you have a history of stomach ulcers or chronic kidney disease.  A prescription for pain medication may be given to you upon discharge.  Take your pain medication as prescribed, if you still have uncontrolled pain after taking acetaminophen (Tylenol) or ibuprofen (Advil). Use ice packs to help control pain. If you need a refill on your pain medication, please contact your pharmacy.  They will contact our office to request authorization. Prescriptions will not be filled after 5pm or on week-ends.  HOME MEDICATIONS Take your usually prescribed medications unless otherwise directed.  DIET You should follow a light diet the first few days after arrival home.  Be sure to include lots of fluids daily. Avoid fatty, fried foods.   CONSTIPATION It is common to experience some constipation after surgery and if you are taking pain medication.  Increasing fluid intake and taking a stool softener (such as Colace)  will usually help or prevent this problem from occurring.  A mild laxative (Milk of Magnesia or Miralax) should be taken according to package instructions if there are no bowel movements after 48 hours.  WOUND/INCISION CARE Most patients will experience some swelling and bruising in the area of the incisions.  Ice packs will help.  Swelling and bruising can take several days to resolve.  Unless discharge instructions indicate otherwise, follow guidelines below  STERI-STRIPS - you may remove your outer bandages 48 hours after surgery, and you may shower at that time.  You have steri-strips (small skin tapes) in place directly over the incision.  These strips should be left on the skin for 7-10 days.   DERMABOND/SKIN GLUE - you may shower in 24 hours.  The glue will flake off over the next 2-3 weeks. Any sutures or staples will be removed at the office during your follow-up visit.  ACTIVITIES You may resume regular (light) daily activities beginning the next day--such as daily self-care, walking, climbing stairs--gradually increasing activities as tolerated.  You may have sexual intercourse when it is comfortable.  Refrain from any heavy lifting or straining until approved by your doctor. You may drive when you are no longer taking prescription pain medication, you can comfortably wear a seatbelt, and you can safely maneuver your car and apply brakes.  FOLLOW-UP You should see your doctor in the office for a follow-up appointment approximately 2-3 weeks after your surgery.  You should have been given your post-op/follow-up appointment when your surgery was scheduled.  If you did not receive a post-op/follow-up appointment, make sure   that you call for this appointment within a day or two after you arrive home to insure a convenient appointment time.   WHEN TO CALL YOUR DOCTOR: Fever over 101.0 Inability to urinate Continued bleeding from incision. Increased pain, redness, or drainage from the  incision. Increasing abdominal pain  The clinic staff is available to answer your questions during regular business hours.  Please don't hesitate to call and ask to speak to one of the nurses for clinical concerns.  If you have a medical emergency, go to the nearest emergency room or call 911.  A surgeon from [facility] is always on call at the hospital. [address], [address], [place], [place]  [address] ? [address] [address], [place], [place]   [address] [phone] ? [phone] ? FAX [phone]  

## 2021-10-08 NOTE — Progress Notes (Signed)
PHARMACY - PHYSICIAN COMMUNICATION CRITICAL VALUE ALERT - BLOOD CULTURE IDENTIFICATION (BCID)  Susan Nichols is an 71 y.o. female who presented to [facility] on 10/05/2021 with acute pancreatitis associated with transaminitis.  Assessment:  1/4 bottles staph species, likely contaminant Started on Zosyn 5/31 for fever   Name of physician (or Provider) Contacted: Dr. Wyline Copas  Current antibiotics: Zosyn  Changes to prescribed antibiotics recommended: none, most likely contaminant. Continue to monitor on current antibiotics for now.  Results for orders placed or performed during the hospital encounter of 10/05/21  Blood Culture ID Panel (Reflexed) (Collected: 10/07/2021 11:05 AM)  Result Value Ref Range   Enterococcus faecalis NOT DETECTED NOT DETECTED   Enterococcus Faecium NOT DETECTED NOT DETECTED   Listeria monocytogenes NOT DETECTED NOT DETECTED   Staphylococcus species DETECTED (A) NOT DETECTED   Staphylococcus aureus (BCID) NOT DETECTED NOT DETECTED   Staphylococcus epidermidis NOT DETECTED NOT DETECTED   Staphylococcus lugdunensis NOT DETECTED NOT DETECTED   Streptococcus species NOT DETECTED NOT DETECTED   Streptococcus agalactiae NOT DETECTED NOT DETECTED   Streptococcus pneumoniae NOT DETECTED NOT DETECTED   Streptococcus pyogenes NOT DETECTED NOT DETECTED   A.calcoaceticus-baumannii NOT DETECTED NOT DETECTED   Bacteroides fragilis NOT DETECTED NOT DETECTED   Enterobacterales NOT DETECTED NOT DETECTED   Enterobacter cloacae complex NOT DETECTED NOT DETECTED   Escherichia coli NOT DETECTED NOT DETECTED   Klebsiella aerogenes NOT DETECTED NOT DETECTED   Klebsiella oxytoca NOT DETECTED NOT DETECTED   Klebsiella pneumoniae NOT DETECTED NOT DETECTED   Proteus species NOT DETECTED NOT DETECTED   Salmonella species NOT DETECTED NOT DETECTED   Serratia marcescens NOT DETECTED NOT DETECTED   Haemophilus influenzae NOT DETECTED NOT DETECTED   Neisseria meningitidis NOT DETECTED NOT  DETECTED   Pseudomonas aeruginosa NOT DETECTED NOT DETECTED   Stenotrophomonas maltophilia NOT DETECTED NOT DETECTED   Candida albicans NOT DETECTED NOT DETECTED   Candida auris NOT DETECTED NOT DETECTED   Candida glabrata NOT DETECTED NOT DETECTED   Candida krusei NOT DETECTED NOT DETECTED   Candida parapsilosis NOT DETECTED NOT DETECTED   Candida tropicalis NOT DETECTED NOT DETECTED   Cryptococcus neoformans/gattii NOT DETECTED NOT DETECTED    Tawnya Crook, PharmD, BCPS Clinical Pharmacist 10/08/2021 1:14 PM

## 2021-10-09 ENCOUNTER — Encounter (HOSPITAL_COMMUNITY): Payer: Self-pay | Admitting: Surgery

## 2021-10-09 DIAGNOSIS — R748 Abnormal levels of other serum enzymes: Secondary | ICD-10-CM | POA: Diagnosis not present

## 2021-10-09 DIAGNOSIS — R1013 Epigastric pain: Secondary | ICD-10-CM | POA: Diagnosis not present

## 2021-10-09 DIAGNOSIS — N179 Acute kidney failure, unspecified: Secondary | ICD-10-CM | POA: Diagnosis not present

## 2021-10-09 LAB — CBC
HCT: 28.3 % — ABNORMAL LOW (ref 36.0–46.0)
Hemoglobin: 9.1 g/dL — ABNORMAL LOW (ref 12.0–15.0)
MCH: 27.8 pg (ref 26.0–34.0)
MCHC: 32.2 g/dL (ref 30.0–36.0)
MCV: 86.5 fL (ref 80.0–100.0)
Platelets: 207 10*3/uL (ref 150–400)
RBC: 3.27 MIL/uL — ABNORMAL LOW (ref 3.87–5.11)
RDW: 15.3 % (ref 11.5–15.5)
WBC: 9.3 10*3/uL (ref 4.0–10.5)
nRBC: 0 % (ref 0.0–0.2)

## 2021-10-09 LAB — COMPREHENSIVE METABOLIC PANEL
ALT: 199 U/L — ABNORMAL HIGH (ref 0–44)
AST: 118 U/L — ABNORMAL HIGH (ref 15–41)
Albumin: 3.2 g/dL — ABNORMAL LOW (ref 3.5–5.0)
Alkaline Phosphatase: 246 U/L — ABNORMAL HIGH (ref 38–126)
Anion gap: 9 (ref 5–15)
BUN: 24 mg/dL — ABNORMAL HIGH (ref 8–23)
CO2: 25 mmol/L (ref 22–32)
Calcium: 8.9 mg/dL (ref 8.9–10.3)
Chloride: 104 mmol/L (ref 98–111)
Creatinine, Ser: 1.45 mg/dL — ABNORMAL HIGH (ref 0.44–1.00)
GFR, Estimated: 39 mL/min — ABNORMAL LOW (ref >60)
Glucose, Bld: 153 mg/dL — ABNORMAL HIGH (ref 70–99)
Potassium: 4.3 mmol/L (ref 3.5–5.1)
Sodium: 138 mmol/L (ref 135–145)
Total Bilirubin: 0.8 mg/dL (ref 0.3–1.2)
Total Protein: 6.9 g/dL (ref 6.5–8.1)

## 2021-10-09 LAB — CULTURE, BLOOD (ROUTINE X 2): Special Requests: ADEQUATE

## 2021-10-09 LAB — SURGICAL PATHOLOGY

## 2021-10-09 MED ORDER — PHENOL 1.4 % MT LIQD: 1.0000 | OROMUCOSAL | Status: DC | PRN

## 2021-10-09 MED ORDER — OXYCODONE HCL 5 MG PO TABS: 5.0000 mg | ORAL_TABLET | Freq: Four times a day (QID) | ORAL | 0 refills | Status: DC | PRN

## 2021-10-09 NOTE — Care Management Important Message (Signed)
Important Message  Patient Details IM Letter given to the Patient. Name: Susan Nichols MRN: [id number] Date of Birth: 1951/02/26   Medicare Important Message Given:  Yes     Kerin Salen 10/09/2021, 2:52 PM

## 2021-10-09 NOTE — Discharge Summary (Signed)
Physician Discharge Summary   Patient: Susan Nichols MRN: [id number] DOB: [birth year]  Admit date:     10/05/2021  Discharge date: 10/09/21  Discharge Physician: Marylu Lund   PCP: Lucianne Lei, MD   Recommendations at discharge:    Follow up with PCP in 1-2 weeks Follow up with General Surgery as scheduled  Discharge Diagnoses: Principal Problem:   Acute pancreatitis Active Problems:   Elevated lipase   AKI (acute kidney injury) ([place])   Hypertension   Thyroid goiter   History of CVA (cerebrovascular accident)   Hyperlipemia   Elevated liver enzymes  Resolved Problems:   * No resolved hospital problems. *  Hospital Course: Susan Nichols is a 71 y.o. female with medical history significant for history of CVA, HTN, HLD, multinodular goiter who is admitted with acute pancreatitis associated with transaminitis.  [facility] ED Course  Labs/Imaging on admission: I have personally reviewed following labs and imaging studies.   Initial vitals showed BP 143/57, pulse 70, RR 18, temp 97.0 F, SPO2 100% on room air.   Labs show lipase 2651, AST 1164, ALT 612, alk phos 310, T. bili 1.5, BUN 35, creatinine 1.85, serum glucose 138, sodium 139, potassium 4.4, bicarb 23, WBC 10.1, hemoglobin 10.3, platelets 273,000.  Urinalysis negative for UTI.   CT abdomen/pelvis without contrast negative for radiographic signs of acute pancreatitis or other acute findings.  Cholelithiasis without evidence of cholecystitis or biliary ductal dilatation noted.   RUQ ultrasound negative for acute findings.  Cholelithiasis again noted without evidence of cholecystitis.  CBD diameter 3 mm.  MRCP also positive for cholelithiasis, no cholecystitis.  No choledocholithiasis or evidence of suggested biliary tract obstruction.Mild T2 hyperintensity adjacent to the pancreatic head. This is nonspecific, but could suggest acute inflammation. Correlation with lipase levels is recommended to exclude  possible acute pancreatitis. Intrahepatic shunt with apparent communication between a branch of the right portal vein and the middle hepatic vein, as above.  5/30: Some improvement in AST and ALT, worsening of alkaline phosphatase to 371 and T. bili to 3.6.  Slight improvement of creatinine at 1.70, baseline around 1.3. GI consulted general surgery for laparoscopic cholecystectomy, general surgery wants cardiology clearance based on her recent CTA which shows aortic atherosclerosis.  Message sent to cardiology. Started on clear liquid and we will advance as tolerated.  Assessment and Plan: * Acute pancreatitis Transaminitis Lipase >2600 with AST 1164, ALT 612, alk phos 310, T. bili 1.5.  No prior history of pancreatitis.  CT A/P and RUQ U/S showed cholelithiasis without evidence of acute cholecystitis or CBD dilatation.  MRCP with concern of acute pancreatitis.  Patient reports symptoms began after eating ribs from a restaurant. General surgery was consulted and patient has since undergone cholecystectomy on 10/08/2021 -Continue with analgesia as needed  Elevated lipase Secondary to acute pancreatitis Lipase had trended down   AKI (acute kidney injury) ([place]) Creatinine 1.85 on admission compared to prior 1.09 in July 2019.  Likely secondary to acute pancreatitis.   -Cr stable at 1.45 -Patient was continued on IV fluid hydration  Hypertension home meds were initially held as he was n.p.o. -Continued on home amlodipine, clonidine and metoprolol -held lisinopril and chlorthalidone due to AKI -Blood pressure overall stable  Thyroid goiter Patient has known large multinodular goiter with tracheal and esophageal deviation.  Recently evaluated by general surgery, Dr. Harlow Asa, without worrisome features. -would f/u as outpatient  History of CVA (cerebrovascular accident) Initially held aspirin in anticipation for surgery, would resume when okay  with surgery - Statin held with elevated  LFTs.  Hyperlipemia Held statin.  Elevated liver enzymes Likely secondary to gallstone pancreatitis LFT's trending down  Anxiety -Seems stable at this time         Consultants: General Surgery, GI Procedures performed: Cholecystectomy 10/08/21  Disposition: Home Diet recommendation:  Soft DISCHARGE MEDICATION: Allergies as of 10/09/2021   No Known Allergies      Medication List     STOP taking these medications    atorvastatin 40 MG tablet Commonly known as: LIPITOR   Farxiga 10 MG Tabs tablet Generic drug: dapagliflozin propanediol   lisinopril 20 MG tablet Commonly known as: ZESTRIL   potassium chloride 10 MEQ tablet Commonly known as: KLOR-CON       TAKE these medications    amLODipine 10 MG tablet Commonly known as: NORVASC Take 1 tablet (10 mg total) by mouth daily. What changed: when to take this   aspirin 81 MG tablet Take 1 tablet (81 mg total) by mouth daily.   chlorthalidone 25 MG tablet Commonly known as: HYGROTON Take 1 tablet (25 mg total) by mouth daily.   cholecalciferol 25 MCG (1000 UNIT) tablet Commonly known as: VITAMIN D3 Take 1,000 Units by mouth daily.   cloNIDine 0.2 MG tablet Commonly known as: CATAPRES Take 0.2 mg by mouth 2 (two) times daily.   diphenhydramine-acetaminophen 25-500 MG Tabs tablet Commonly known as: TYLENOL PM Take 1 tablet by mouth in the morning and at bedtime.   hydrALAZINE 50 MG tablet Commonly known as: APRESOLINE Take 1 tablet (50 mg total) by mouth 2 (two) times daily.   isosorbide mononitrate 60 MG 24 hr tablet Commonly known as: IMDUR Take 1 tablet (60 mg total) by mouth daily.   Magnesium 400 MG Tabs Take 1 tablet by mouth daily.   metoprolol tartrate 50 MG tablet Commonly known as: LOPRESSOR Take 1 tablet (50 mg total) by mouth 2 (two) times daily.   oxyCODONE 5 MG immediate release tablet Commonly known as: Oxy IR/ROXICODONE Take 1 tablet (5 mg total) by mouth every 6 (six)  hours as needed for breakthrough pain.        Follow-up Information     [facility] [place] Follow up on 10/29/2021.   Specialty: General Surgery Why: 6/22 at 2:45. Please bring a copy of your photo ID and insurance card. Please arrive 30 minutes prior to your appointment for paperwork. Contact information: [address] [place] [address] [phone]                Discharge Exam: Danley Danker Weights   10/08/21 0500 10/08/21 0728 10/09/21 0500  Weight: 100.5 kg 100.5 kg 102.8 kg   General exam: Awake, laying in bed, in nad Respiratory system: Normal respiratory effort, no wheezing Cardiovascular system: regular rate, s1, s2 Gastrointestinal system: Soft, nondistended, positive BS Central nervous system: CN2-12 grossly intact, strength intact Extremities: Perfused, no clubbing Skin: Normal skin turgor, no notable skin lesions seen Psychiatry: Mood normal // no visual hallucinations   Condition at discharge: fair  The results of significant diagnostics from this hospitalization (including imaging, microbiology, ancillary and laboratory) are listed below for reference.   Imaging Studies: CT ABDOMEN PELVIS WO CONTRAST  Result Date: 10/05/2021 CLINICAL DATA:  Central abdominal pain beginning yesterday. Pancreatitis. EXAM: CT ABDOMEN AND PELVIS WITHOUT CONTRAST TECHNIQUE: Multidetector CT imaging of the abdomen and pelvis was performed following the standard protocol without IV contrast. RADIATION DOSE REDUCTION: This exam was performed according to  the departmental dose-optimization program which includes automated exposure control, adjustment of the mA and/or kV according to patient size and/or use of iterative reconstruction technique. COMPARISON:  None Available. FINDINGS: Lower chest: No acute findings. Hepatobiliary: No mass visualized on this unenhanced exam. Numerous tiny calcified gallstones are seen, however there is no evidence of cholecystitis or  biliary ductal dilatation. Pancreas: No mass or inflammatory process visualized on this unenhanced exam. Spleen:  Within normal limits in size. Adrenals/Urinary tract: No evidence of urolithiasis or hydronephrosis. Unremarkable unopacified urinary bladder. Stomach/Bowel: Small hiatal hernia noted. No evidence of obstruction, inflammatory process, or abnormal fluid collections. Vascular/Lymphatic: No pathologically enlarged lymph nodes identified. No evidence of abdominal aortic aneurysm. Aortic atherosclerotic calcification incidentally noted. Reproductive:  No mass or other significant abnormality. Other:  None. Musculoskeletal:  No suspicious bone lesions identified. IMPRESSION: No radiographic signs of acute pancreatitis or other acute findings. Cholelithiasis. No radiographic evidence of cholecystitis. Small hiatal hernia. Electronically Signed   By: Marlaine Hind M.D.   On: 10/05/2021 16:48   DG Cholangiogram Operative  Result Date: 10/08/2021 CLINICAL DATA:  Right upper quadrant pain, nausea, vomiting Cholelithiasis EXAM: INTRAOPERATIVE CHOLANGIOGRAM TECHNIQUE: Cholangiographic images from the C-arm fluoroscopic device were submitted for interpretation post-operatively. Please see the procedural report for the amount of contrast and the fluoroscopy time utilized. FLUOROSCOPY: Radiation Exposure Index (as provided by the fluoroscopic device): 9.7 mGy Kerma COMPARISON:  None Available. FINDINGS: Single intraoperative image and 1 cine clip of intraoperative cholangiogram were provided for interpretation. The cine clip shows diffuse extravasation of contrast in the right upper quadrant. IMPRESSION: Intraoperative cholangiogram as above. Electronically Signed   By: Miachel Roux M.D.   On: 10/08/2021 10:00   CT SOFT TISSUE NECK W WO CONTRAST  Result Date: 09/22/2021 CLINICAL DATA:  Left neck mass EXAM: CT NECK WITH AND WITHOUT CONTRAST TECHNIQUE: Multidetector CT imaging of the neck was performed without and  with intravenous contrast. RADIATION DOSE REDUCTION: This exam was performed according to the departmental dose-optimization program which includes automated exposure control, adjustment of the mA and/or kV according to patient size and/or use of iterative reconstruction technique. CONTRAST:  65m ISOVUE-370 IOPAMIDOL (ISOVUE-370) INJECTION 76% COMPARISON:  11/25/2027 CTA head neck, correlation is made with 05/25/2021 thyroid ultrasound FINDINGS: Pharynx and larynx: Normal. No mass or swelling. The larynx and trachea are deviated to the right, secondary to the thyroid mass, described below. Compression of the esophagus. Salivary glands: No inflammation, mass, or stone. The left submandibular gland is somewhat anteriorly displaced by the superior margin the left thyroid. Thyroid: Redemonstrated multinodular goiter, with the left lobe now measuring up to 7.3 x 6.6 x 10.5 cm (AP x TR x CC) (series 16, image 92 and series 27, image 55), previously 4.5 x 5.4 x 7.5 cm when remeasured similarly. The isthmus is enlarged, measuring up to 3.7 cm in craniocaudal dimension (series 27, image 48), previously 2.9 cm. The right thyroid lobe measures up to 2.3 x 3.2 x 5.8 cm (AP x TR x CC) (series 16, image 103 and series 27, image 52), previously 2.9 x 2.7 x 6.1 cm. Lymph nodes: None enlarged or abnormal density. Vascular: Patent. The left internal jugular vein is laterally displaced by the thyroid, but remains patent. Aortic atherosclerotic calcifications, which extend into the branch vessels. Limited intracranial: Negative. Visualized orbits: Negative. Mastoids and visualized paranasal sinuses: Clear. Skeleton: No acute osseous abnormality. Upper chest: Focal pulmonary opacity.  No pleural effusion. Other: None. IMPRESSION: 1. Multinodular goiter, with significant increase in  the size of the left thyroid lobe and isthmus compared to the 2019 CT, but likely similar to the 05/25/2021 ultrasound. This causes rightward deviation of  the larynx and trachea, as well as compression of the esophagus. 2. No other acute finding in the neck. Electronically Signed   By: Merilyn Baba M.D.   On: 09/22/2021 14:33   MR ABDOMEN MRCP WO CONTRAST  Result Date: 10/06/2021 CLINICAL DATA:  71 year old female with history of right upper quadrant abdominal pain with nausea and vomiting. Cholelithiasis noted on recent ultrasound examination. No prior abdominal MRI. EXAM: MRI ABDOMEN WITHOUT CONTRAST  (INCLUDING MRCP) TECHNIQUE: Multiplanar multisequence MR imaging of the abdomen was performed. Heavily T2-weighted images of the biliary and pancreatic ducts were obtained, and three-dimensional MRCP images were rendered by post processing. COMPARISON:  Right upper quadrant abdominal ultrasound 10/05/2021. CT the abdomen and pelvis 10/05/2021. FINDINGS: Comment: Today's study is limited for detection and characterization of visceral and/or vascular lesions by lack of IV gadolinium. Lower chest: Unremarkable. Hepatobiliary: Diffuse loss of signal intensity throughout the hepatic parenchyma on out of phase dual echo images, indicative of a background of hepatic steatosis. No definite suspicious appearing hepatic lesions are noted on today's noncontrast examination. There is what appears to be a flow void in the central aspect of the liver, likely direct communication between a branch of the right portal vein and the middle hepatic vein, suggesting an intrahepatic shunt. There are some filling defects lying dependently within the gallbladder, compatible with tiny gallstones. Gallbladder is moderately distended. Gallbladder wall thickness is normal. No pericholecystic fluid or overt surrounding inflammatory changes. MRCP images demonstrate no intra or extrahepatic biliary ductal dilatation. Common bile duct is normal in caliber measuring up to 6 mm in the porta hepatis. No filling defect in the common bile duct to suggest choledocholithiasis. Pancreas: No definite  pancreatic mass identified on today's noncontrast examination. No pancreatic ductal dilatation. Mild T2 hyperintensity is noted adjacent to the pancreatic head, which could suggest inflammation. No well-defined peripancreatic fluid collections are noted. Spleen:  Unremarkable. Adrenals/Urinary Tract: Subcentimeter T1 hypointense, T2 hyperintense lesions are noted in the left kidney, incompletely characterize, but statistically likely to represent small cysts (no imaging follow-up is recommended). Right kidney and bilateral adrenal glands are normal in appearance. No hydroureteronephrosis in the visualized portions of the abdomen. Stomach/Bowel: Visualized portions are unremarkable. Vascular/Lymphatic: No aneurysm identified in the visualized abdominal vasculature. No lymphadenopathy noted in the abdomen. Other: No significant volume of ascites noted in the visualized portions of the peritoneal cavity. Musculoskeletal: No aggressive appearing osseous lesions are noted in the visualized portions of the skeleton. IMPRESSION: 1. Study is positive for cholelithiasis. There is moderate distension of the gallbladder, but no other overt imaging findings to clearly suggest an acute cholecystitis at this time. 2. No choledocholithiasis or evidence to suggest biliary tract obstruction. 3. Mild T2 hyperintensity adjacent to the pancreatic head. This is nonspecific, but could suggest acute inflammation. Correlation with lipase levels is recommended to exclude possible acute pancreatitis. 4. Intrahepatic shunt with apparent communication between a branch of the right portal vein and the middle hepatic vein, as above. Electronically Signed   By: Vinnie Langton M.D.   On: 10/06/2021 07:26   MR 3D Recon At Scanner  Result Date: 10/06/2021 CLINICAL DATA:  71 year old female with history of right upper quadrant abdominal pain with nausea and vomiting. Cholelithiasis noted on recent ultrasound examination. No prior abdominal MRI.  EXAM: MRI ABDOMEN WITHOUT CONTRAST  (INCLUDING MRCP) TECHNIQUE: Multiplanar  multisequence MR imaging of the abdomen was performed. Heavily T2-weighted images of the biliary and pancreatic ducts were obtained, and three-dimensional MRCP images were rendered by post processing. COMPARISON:  Right upper quadrant abdominal ultrasound 10/05/2021. CT the abdomen and pelvis 10/05/2021. FINDINGS: Comment: Today's study is limited for detection and characterization of visceral and/or vascular lesions by lack of IV gadolinium. Lower chest: Unremarkable. Hepatobiliary: Diffuse loss of signal intensity throughout the hepatic parenchyma on out of phase dual echo images, indicative of a background of hepatic steatosis. No definite suspicious appearing hepatic lesions are noted on today's noncontrast examination. There is what appears to be a flow void in the central aspect of the liver, likely direct communication between a branch of the right portal vein and the middle hepatic vein, suggesting an intrahepatic shunt. There are some filling defects lying dependently within the gallbladder, compatible with tiny gallstones. Gallbladder is moderately distended. Gallbladder wall thickness is normal. No pericholecystic fluid or overt surrounding inflammatory changes. MRCP images demonstrate no intra or extrahepatic biliary ductal dilatation. Common bile duct is normal in caliber measuring up to 6 mm in the porta hepatis. No filling defect in the common bile duct to suggest choledocholithiasis. Pancreas: No definite pancreatic mass identified on today's noncontrast examination. No pancreatic ductal dilatation. Mild T2 hyperintensity is noted adjacent to the pancreatic head, which could suggest inflammation. No well-defined peripancreatic fluid collections are noted. Spleen:  Unremarkable. Adrenals/Urinary Tract: Subcentimeter T1 hypointense, T2 hyperintense lesions are noted in the left kidney, incompletely characterize, but  statistically likely to represent small cysts (no imaging follow-up is recommended). Right kidney and bilateral adrenal glands are normal in appearance. No hydroureteronephrosis in the visualized portions of the abdomen. Stomach/Bowel: Visualized portions are unremarkable. Vascular/Lymphatic: No aneurysm identified in the visualized abdominal vasculature. No lymphadenopathy noted in the abdomen. Other: No significant volume of ascites noted in the visualized portions of the peritoneal cavity. Musculoskeletal: No aggressive appearing osseous lesions are noted in the visualized portions of the skeleton. IMPRESSION: 1. Study is positive for cholelithiasis. There is moderate distension of the gallbladder, but no other overt imaging findings to clearly suggest an acute cholecystitis at this time. 2. No choledocholithiasis or evidence to suggest biliary tract obstruction. 3. Mild T2 hyperintensity adjacent to the pancreatic head. This is nonspecific, but could suggest acute inflammation. Correlation with lipase levels is recommended to exclude possible acute pancreatitis. 4. Intrahepatic shunt with apparent communication between a branch of the right portal vein and the middle hepatic vein, as above. Electronically Signed   By: Vinnie Langton M.D.   On: 10/06/2021 07:26   CT ANGIO CHEST AORTA W/CM & OR WO/CM  Result Date: 09/21/2021 CLINICAL DATA:  Left neck mass for the past 8 months with tracheal deviation, dysphagia with food getting stuck. History of stroke. EXAM: CT ANGIOGRAPHY CHEST WITH CONTRAST TECHNIQUE: Multidetector CT imaging of the chest was performed using the standard protocol during bolus administration of intravenous contrast. Multiplanar CT image reconstructions and MIPs were obtained to evaluate the vascular anatomy. RADIATION DOSE REDUCTION: This exam was performed according to the departmental dose-optimization program which includes automated exposure control, adjustment of the mA and/or kV  according to patient size and/or use of iterative reconstruction technique. CONTRAST:  27m ISOVUE-370 IOPAMIDOL (ISOVUE-370) INJECTION 76% COMPARISON:  January 04, 2006 FINDINGS: Study is slightly suboptimal due to likely respiratory motion artifacts. Cardiovascular: Heart is borderline. No pericardial effusion. Severe atheromatous calcifications of the thoracic aorta. Coronary artery calcifications. Main pulmonary artery measures 3.3 cm and  is mildly dilated. Mediastinum/Nodes: No mediastinal mass or significant lymphadenopathy. Heterogeneous enlargement of the left thyroid lobe. The conglomerated thyroid nodules measure 6.5 cm AP x 6.9 cm transverse in comparison to 3.4 x 3.8 cm diffuse enlargement of the left thyroid lobe. The craniocaudad measurements cannot be made as the upper aspect of the thyroid is not included in the study. Thyroid extends into the superior mediastinum. There are some coarse calcifications seen within the thyroid mass. There is rightward deviation of the trachea seen. There is apparent compression of the esophagus seen. Small hiatal hernia. Lungs/Pleura: Lungs are clear. No pleural effusion or pneumothorax. Upper Abdomen: Biliary sludge in the dependent gallbladder without cholecystitis. Mild steatosis. Spleen, pancreas, adrenals and kidneys have a normal appearance. Small hiatal hernia. Musculoskeletal: Mild thoracic spondylosis Review of the MIP images confirms the above findings. IMPRESSION: Multinodular left thyroid mass and has significantly increased in the interim. Rightward deviation of the trachea. Esophagus at the region of the thyroid mass appears to be compressed. Recommend ultrasound examination and ultrasound guided biopsy of the left thyroid mass. Severe atheromatous calcifications of the thoracic aorta. Mild dilation of the main pulmonary artery. Electronically Signed   By: Frazier Richards M.D.   On: 09/21/2021 14:02   ECHOCARDIOGRAM COMPLETE  Result Date: 10/07/2021     ECHOCARDIOGRAM REPORT   Patient Name:   GEORGANNA MAXSON Date of Exam: 10/07/2021 Medical Rec #:  [id number]       Height:       64.0 in Accession #:    [id number]      Weight:       218.5 lb Date of Birth:  1951-02-13      BSA:          2.031 m Patient Age:    71 years        BP:           131/43 mmHg Patient Gender: F               HR:           90 bpm. Exam Location:  Inpatient Procedure: 2D Echo, Cardiac Doppler, Color Doppler and Intracardiac            Opacification Agent Indications:     Z01.818 Encounter for other preprocedural examination  History:         Patient has prior history of Echocardiogram examinations, most                  recent 11/25/2017. Stroke; Risk Factors:Hypertension and                  Dyslipidemia.  Sonographer:     Roseanna Rainbow RDCS Referring Phys:  [place] Diagnosing Phys: Dixie Dials MD  Sonographer Comments: Technically difficult study due to poor echo windows and patient is morbidly obese. Image acquisition challenging due to patient body habitus. IMPRESSIONS  1. Left ventricular ejection fraction, by estimation, is 60 to 65%. The left ventricle has normal function. The left ventricle has no regional wall motion abnormalities. Left ventricular diastolic parameters are consistent with Grade I diastolic dysfunction (impaired relaxation).  2. Right ventricular systolic function is normal. The right ventricular size is normal. There is normal pulmonary artery systolic pressure.  3. Left atrial size was moderately dilated.  4. The pericardial effusion is circumferential. There is no evidence of cardiac tamponade.  5. The mitral valve is degenerative. Mild mitral valve regurgitation.  6. The aortic valve is tricuspid. There  is mild calcification of the aortic valve. There is mild thickening of the aortic valve. Aortic valve regurgitation is not visualized. Aortic valve sclerosis is present, with no evidence of aortic valve stenosis.  7. The inferior vena cava is dilated in size  with >50% respiratory variability, suggesting right atrial pressure of 8 mmHg. FINDINGS  Left Ventricle: Left ventricular ejection fraction, by estimation, is 60 to 65%. The left ventricle has normal function. The left ventricle has no regional wall motion abnormalities. Definity contrast agent was given IV to delineate the left ventricular  endocardial borders. The left ventricular internal cavity size was normal in size. There is borderline concentric left ventricular hypertrophy. Left ventricular diastolic parameters are consistent with Grade I diastolic dysfunction (impaired relaxation). Right Ventricle: The right ventricular size is normal. No increase in right ventricular wall thickness. Right ventricular systolic function is normal. There is normal pulmonary artery systolic pressure. The tricuspid regurgitant velocity is 2.27 m/s, and  with an assumed right atrial pressure of 3 mmHg, the estimated right ventricular systolic pressure is 52.8 mmHg. Left Atrium: Left atrial size was moderately dilated. Right Atrium: Right atrial size was normal in size. Pericardium: Trivial pericardial effusion is present. The pericardial effusion is circumferential. There is no evidence of cardiac tamponade. Mitral Valve: The mitral valve is degenerative in appearance. Mild mitral valve regurgitation. MV peak gradient, 8.1 mmHg. The mean mitral valve gradient is 4.0 mmHg. Tricuspid Valve: The tricuspid valve is normal in structure. Tricuspid valve regurgitation is trivial. Aortic Valve: The aortic valve is tricuspid. There is mild calcification of the aortic valve. There is mild thickening of the aortic valve. There is mild aortic valve annular calcification. Aortic valve regurgitation is not visualized. Aortic valve sclerosis is present, with no evidence of aortic valve stenosis. Pulmonic Valve: The pulmonic valve was normal in structure. Pulmonic valve regurgitation is not visualized. Aorta: The aortic root is normal in size  and structure. There is minimal (Grade I) atheroma plaque involving the aortic root and aortic arch. Venous: The inferior vena cava is dilated in size with greater than 50% respiratory variability, suggesting right atrial pressure of 8 mmHg. IAS/Shunts: The atrial septum is grossly normal.  LEFT VENTRICLE PLAX 2D LVIDd:         4.55 cm      Diastology LVIDs:         2.85 cm      LV e' medial:    6.85 cm/s LV PW:         1.15 cm      LV E/e' medial:  14.7 LV IVS:        1.10 cm      LV e' lateral:   8.92 cm/s LVOT diam:     2.20 cm      LV E/e' lateral: 11.3 LV SV:         95 LV SV Index:   47 LVOT Area:     3.80 cm  LV Volumes (MOD) LV vol d, MOD A2C: 158.0 ml LV vol d, MOD A4C: 123.0 ml LV vol s, MOD A2C: 53.0 ml LV vol s, MOD A4C: 48.1 ml LV SV MOD A2C:     105.0 ml LV SV MOD A4C:     123.0 ml LV SV MOD BP:      92.7 ml RIGHT VENTRICLE             IVC RV S prime:     15.10 cm/s  IVC diam: 2.40 cm  TAPSE (M-mode): 2.4 cm LEFT ATRIUM             Index        RIGHT ATRIUM           Index LA diam:        4.70 cm 2.31 cm/m   RA Area:     10.60 cm LA Vol (A2C):   77.2 ml 38.01 ml/m  RA Volume:   21.20 ml  10.44 ml/m LA Vol (A4C):   45.0 ml 22.15 ml/m LA Biplane Vol: 58.8 ml 28.95 ml/m  AORTIC VALVE LVOT Vmax:   129.00 cm/s LVOT Vmean:  88.100 cm/s LVOT VTI:    0.250 m  AORTA Ao Root diam: 3.00 cm Ao Asc diam:  3.30 cm MITRAL VALVE                TRICUSPID VALVE MV Area (PHT): 5.68 cm     TR Peak grad:   20.6 mmHg MV Area VTI:   3.18 cm     TR Vmax:        227.00 cm/s MV Peak grad:  8.1 mmHg MV Mean grad:  4.0 mmHg     SHUNTS MV Vmax:       1.42 m/s     Systemic VTI:  0.25 m MV Vmean:      86.9 cm/s    Systemic Diam: 2.20 cm MV Decel Time: 134 msec MV E velocity: 100.90 cm/s MV A velocity: 127.00 cm/s MV E/A ratio:  0.79 Dixie Dials MD Electronically signed by Dixie Dials MD Signature Date/Time: 10/07/2021/9:23:08 AM    Final    US Abdomen Limited RUQ (LIVER/GB)  Result Date: 10/05/2021 CLINICAL DATA:   RUQ/midline abd pain x 1 day with nausea and vomiting. HTN on meds. EXAM: ULTRASOUND ABDOMEN LIMITED RIGHT UPPER QUADRANT COMPARISON:  None Available. FINDINGS: Gallbladder: Moderately distended. Small dependent gallstones. No wall thickening. No pericholecystic fluid and no sonographic Murphy's sign. Common bile duct: Diameter: 3 mm. Liver: No focal lesion identified. Within normal limits in parenchymal echogenicity. Portal vein is patent on color Doppler imaging with normal direction of blood flow towards the liver. Other: None. IMPRESSION: 1. No acute findings. 2. Cholelithiasis without evidence of acute cholecystitis. Electronically Signed   By: Lajean Manes M.D.   On: 10/05/2021 17:04    Microbiology: Results for orders placed or performed during the hospital encounter of 10/05/21  Culture, blood (Routine X 2) w Reflex to ID Panel     Status: None (Preliminary result)   Collection Time: 10/07/21 11:02 AM   Specimen: BLOOD RIGHT HAND  Result Value Ref Range Status   Specimen Description   Final    BLOOD RIGHT HAND Performed at [facility], [place] [address], [place], [place] [address]    Special Requests   Final    IN PEDIATRIC BOTTLE Blood Culture adequate volume Performed at [facility] [address], [place], [place] [address]    Culture   Final    NO GROWTH 2 DAYS Performed at [facility] [address], [place], [place] [address]    Report Status PENDING  Incomplete  Culture, blood (Routine X 2) w Reflex to ID Panel     Status: Abnormal   Collection Time: 10/07/21 11:05 AM   Specimen: BLOOD LEFT HAND  Result Value Ref Range Status   Specimen Description   Final    BLOOD LEFT HAND Performed at [facility]  [address], [place], [place] [address]    Special Requests   Final    IN PEDIATRIC BOTTLE Blood Culture adequate volume Performed at [facility] [address], [place], [place]  [address]    Culture  Setup Time   Final    GRAM POSITIVE COCCI IN CLUSTERS IN PEDIATRIC BOTTLE Organism ID to follow CRITICAL RESULT CALLED TO, READ BACK BY AND VERIFIED WITH: PHARMD M BELL [id number] MLM    Culture (A)  Final    STAPHYLOCOCCUS HOMINIS THE SIGNIFICANCE OF ISOLATING THIS ORGANISM FROM A SINGLE SET OF BLOOD CULTURES WHEN MULTIPLE SETS ARE DRAWN IS UNCERTAIN. PLEASE NOTIFY THE MICROBIOLOGY DEPARTMENT WITHIN ONE WEEK IF SPECIATION AND SENSITIVITIES ARE REQUIRED. Performed at [facility], [place] [address], [place], [place] [address]    Report Status 10/09/2021 FINAL  Final  Blood Culture ID Panel (Reflexed)     Status: Abnormal   Collection Time: 10/07/21 11:05 AM  Result Value Ref Range Status   Enterococcus faecalis NOT DETECTED NOT DETECTED Final   Enterococcus Faecium NOT DETECTED NOT DETECTED Final   Listeria monocytogenes NOT DETECTED NOT DETECTED Final   Staphylococcus species DETECTED (A) NOT DETECTED Final    Comment: CRITICAL RESULT CALLED TO, READ BACK BY AND VERIFIED WITH: PHARMD M BELL [id number] MLM    Staphylococcus aureus (BCID) NOT DETECTED NOT DETECTED Final   Staphylococcus epidermidis NOT DETECTED NOT DETECTED Final   Staphylococcus lugdunensis NOT DETECTED NOT DETECTED Final   Streptococcus species NOT DETECTED NOT DETECTED Final   Streptococcus agalactiae NOT DETECTED NOT DETECTED Final   Streptococcus pneumoniae NOT DETECTED NOT DETECTED Final   Streptococcus pyogenes NOT DETECTED NOT DETECTED Final   A.calcoaceticus-baumannii NOT DETECTED NOT DETECTED Final   Bacteroides fragilis NOT DETECTED NOT DETECTED Final   Enterobacterales NOT DETECTED NOT DETECTED Final   Enterobacter cloacae complex NOT DETECTED NOT DETECTED Final   Escherichia coli NOT DETECTED NOT DETECTED Final   Klebsiella aerogenes NOT DETECTED NOT DETECTED Final   Klebsiella oxytoca NOT DETECTED NOT DETECTED Final   Klebsiella pneumoniae NOT DETECTED NOT DETECTED Final    Proteus species NOT DETECTED NOT DETECTED Final   Salmonella species NOT DETECTED NOT DETECTED Final   Serratia marcescens NOT DETECTED NOT DETECTED Final   Haemophilus influenzae NOT DETECTED NOT DETECTED Final   Neisseria meningitidis NOT DETECTED NOT DETECTED Final   Pseudomonas aeruginosa NOT DETECTED NOT DETECTED Final   Stenotrophomonas maltophilia NOT DETECTED NOT DETECTED Final   Candida albicans NOT DETECTED NOT DETECTED Final   Candida auris NOT DETECTED NOT DETECTED Final   Candida glabrata NOT DETECTED NOT DETECTED Final   Candida krusei NOT DETECTED NOT DETECTED Final   Candida parapsilosis NOT DETECTED NOT DETECTED Final   Candida tropicalis NOT DETECTED NOT DETECTED Final   Cryptococcus neoformans/gattii NOT DETECTED NOT DETECTED Final    Comment: Performed at [facility], [address] [place], [place] [address]    Labs: CBC: Recent Labs  Lab 10/05/21 1328 10/06/21 0440 10/07/21 0448 10/08/21 0513 10/09/21 0433  WBC 10.1 9.2 8.1 9.0 9.3  HGB 10.3* 9.1* 8.5* 9.4* 9.1*  HCT 32.5* 29.0* 27.2* 29.5* 28.3*  MCV 85.8 87.3 88.3 87.5 86.5  PLT 273 223 184 202 993   Basic Metabolic Panel: Recent Labs  Lab 10/05/21 1328 10/06/21 0440 10/07/21 0448 10/08/21 0513 10/09/21 0433  NA 139 140 139 139 138  K 4.4 4.6 4.7 4.1 4.3  CL 105 111 109 106  104  CO2 23 23 25 22 25   GLUCOSE 138* 109* 88 97 153*  BUN 35* 34* 30* 27* 24*  CREATININE 1.85* 1.70* 1.74* 1.69* 1.45*  CALCIUM 9.7 9.1 9.1 9.2 8.9  MG  --  2.2  --   --   --    Liver Function Tests: Recent Labs  Lab 10/05/21 1328 10/06/21 0440 10/07/21 0448 10/08/21 0513 10/09/21 0433  AST 1,164* 751* 303* 145* 118*  ALT 612* 581* 351* 259* 199*  ALKPHOS 310* 371* 349* 322* 246*  BILITOT 1.5* 3.6* 1.6* 1.3* 0.8  PROT 7.6 6.9 6.7 7.4 6.9  ALBUMIN 3.8 3.4* 3.1* 3.4* 3.2*   CBG: No results for input(s): GLUCAP in the last 168 hours.  Discharge time spent: less than 30 minutes.  Signed: Marylu Lund, MD [facility] 10/09/2021

## 2021-10-09 NOTE — Progress Notes (Signed)
  Transition of Care [facility]) Screening Note   Patient Details  Name: Ladashia Demarinis Date of Birth: January 09, 1951   Transition of Care [facility]) CM/SW Contact:    Dessa Phi, RN Phone Number: 10/09/2021, 2:05 PM    Transition of Care Department [facility]) has reviewed patient and no TOC needs have been identified at this time. We will continue to monitor patient advancement through interdisciplinary progression rounds. If new patient transition needs arise, please place a TOC consult.

## 2021-10-09 NOTE — Progress Notes (Signed)
1 Day Post-Op   Subjective/Chief Complaint: Feeling better after surgery Pain controlled Tolerating po   Objective: Vital signs in last 24 hours: Temp:  [98.2 F (36.8 C)-99 F (37.2 C)] 98.4 F (36.9 C) (06/02 0541) Pulse Rate:  [80-92] 83 (06/02 0541) Resp:  [12-19] 17 (06/02 0541) BP: (134-185)/(54-75) 134/65 (06/02 0541) SpO2:  [94 %-100 %] 99 % (06/02 0541) Weight:  [102.8 kg] 102.8 kg (06/02 0500) Last BM Date : 10/08/21  Intake/Output from previous day: 06/01 0701 - 06/02 0700 In: 800.7 [I.V.:800; IV Piggyback:0.7] Out: 25 [Blood:25] Intake/Output this shift: No intake/output data recorded.  Exam: Awake and alert Abdomen soft, minimally tender, incisions clean  Lab Results:  Recent Labs    10/08/21 0513 10/09/21 0433  WBC 9.0 9.3  HGB 9.4* 9.1*  HCT 29.5* 28.3*  PLT 202 207   BMET Recent Labs    10/08/21 0513 10/09/21 0433  NA 139 138  K 4.1 4.3  CL 106 104  CO2 22 25  GLUCOSE 97 153*  BUN 27* 24*  CREATININE 1.69* 1.45*  CALCIUM 9.2 8.9   PT/INR No results for input(s): LABPROT, INR in the last 72 hours. ABG No results for input(s): PHART, HCO3 in the last 72 hours.  Invalid input(s): PCO2, PO2  Studies/Results: DG Cholangiogram Operative  Result Date: 10/08/2021 CLINICAL DATA:  Right upper quadrant pain, nausea, vomiting Cholelithiasis EXAM: INTRAOPERATIVE CHOLANGIOGRAM TECHNIQUE: Cholangiographic images from the C-arm fluoroscopic device were submitted for interpretation post-operatively. Please see the procedural report for the amount of contrast and the fluoroscopy time utilized. FLUOROSCOPY: Radiation Exposure Index (as provided by the fluoroscopic device): 9.7 mGy Kerma COMPARISON:  None Available. FINDINGS: Single intraoperative image and 1 cine clip of intraoperative cholangiogram were provided for interpretation. The cine clip shows diffuse extravasation of contrast in the right upper quadrant. IMPRESSION: Intraoperative cholangiogram  as above. Electronically Signed   By: Miachel Roux M.D.   On: 10/08/2021 10:00   ECHOCARDIOGRAM COMPLETE  Result Date: 10/07/2021    ECHOCARDIOGRAM REPORT   Patient Name:   Susan Nichols Date of Exam: 10/07/2021 Medical Rec #:  [id number]       Height:       64.0 in Accession #:    [id number]      Weight:       218.5 lb Date of Birth:  1950-10-01      BSA:          2.031 m Patient Age:    71 years        BP:           131/43 mmHg Patient Gender: F               HR:           90 bpm. Exam Location:  Inpatient Procedure: 2D Echo, Cardiac Doppler, Color Doppler and Intracardiac            Opacification Agent Indications:     Z01.818 Encounter for other preprocedural examination  History:         Patient has prior history of Echocardiogram examinations, most                  recent 11/25/2017. Stroke; Risk Factors:Hypertension and                  Dyslipidemia.  Sonographer:     Roseanna Rainbow RDCS Referring Phys:  [place] Diagnosing Phys: Dixie Dials MD  Sonographer Comments: Technically difficult study  due to poor echo windows and patient is morbidly obese. Image acquisition challenging due to patient body habitus. IMPRESSIONS  1. Left ventricular ejection fraction, by estimation, is 60 to 65%. The left ventricle has normal function. The left ventricle has no regional wall motion abnormalities. Left ventricular diastolic parameters are consistent with Grade I diastolic dysfunction (impaired relaxation).  2. Right ventricular systolic function is normal. The right ventricular size is normal. There is normal pulmonary artery systolic pressure.  3. Left atrial size was moderately dilated.  4. The pericardial effusion is circumferential. There is no evidence of cardiac tamponade.  5. The mitral valve is degenerative. Mild mitral valve regurgitation.  6. The aortic valve is tricuspid. There is mild calcification of the aortic valve. There is mild thickening of the aortic valve. Aortic valve regurgitation is not  visualized. Aortic valve sclerosis is present, with no evidence of aortic valve stenosis.  7. The inferior vena cava is dilated in size with >50% respiratory variability, suggesting right atrial pressure of 8 mmHg. FINDINGS  Left Ventricle: Left ventricular ejection fraction, by estimation, is 60 to 65%. The left ventricle has normal function. The left ventricle has no regional wall motion abnormalities. Definity contrast agent was given IV to delineate the left ventricular  endocardial borders. The left ventricular internal cavity size was normal in size. There is borderline concentric left ventricular hypertrophy. Left ventricular diastolic parameters are consistent with Grade I diastolic dysfunction (impaired relaxation). Right Ventricle: The right ventricular size is normal. No increase in right ventricular wall thickness. Right ventricular systolic function is normal. There is normal pulmonary artery systolic pressure. The tricuspid regurgitant velocity is 2.27 m/s, and  with an assumed right atrial pressure of 3 mmHg, the estimated right ventricular systolic pressure is 74.1 mmHg. Left Atrium: Left atrial size was moderately dilated. Right Atrium: Right atrial size was normal in size. Pericardium: Trivial pericardial effusion is present. The pericardial effusion is circumferential. There is no evidence of cardiac tamponade. Mitral Valve: The mitral valve is degenerative in appearance. Mild mitral valve regurgitation. MV peak gradient, 8.1 mmHg. The mean mitral valve gradient is 4.0 mmHg. Tricuspid Valve: The tricuspid valve is normal in structure. Tricuspid valve regurgitation is trivial. Aortic Valve: The aortic valve is tricuspid. There is mild calcification of the aortic valve. There is mild thickening of the aortic valve. There is mild aortic valve annular calcification. Aortic valve regurgitation is not visualized. Aortic valve sclerosis is present, with no evidence of aortic valve stenosis. Pulmonic  Valve: The pulmonic valve was normal in structure. Pulmonic valve regurgitation is not visualized. Aorta: The aortic root is normal in size and structure. There is minimal (Grade I) atheroma plaque involving the aortic root and aortic arch. Venous: The inferior vena cava is dilated in size with greater than 50% respiratory variability, suggesting right atrial pressure of 8 mmHg. IAS/Shunts: The atrial septum is grossly normal.  LEFT VENTRICLE PLAX 2D LVIDd:         4.55 cm      Diastology LVIDs:         2.85 cm      LV e' medial:    6.85 cm/s LV PW:         1.15 cm      LV E/e' medial:  14.7 LV IVS:        1.10 cm      LV e' lateral:   8.92 cm/s LVOT diam:     2.20 cm  LV E/e' lateral: 11.3 LV SV:         95 LV SV Index:   47 LVOT Area:     3.80 cm  LV Volumes (MOD) LV vol d, MOD A2C: 158.0 ml LV vol d, MOD A4C: 123.0 ml LV vol s, MOD A2C: 53.0 ml LV vol s, MOD A4C: 48.1 ml LV SV MOD A2C:     105.0 ml LV SV MOD A4C:     123.0 ml LV SV MOD BP:      92.7 ml RIGHT VENTRICLE             IVC RV S prime:     15.10 cm/s  IVC diam: 2.40 cm TAPSE (M-mode): 2.4 cm LEFT ATRIUM             Index        RIGHT ATRIUM           Index LA diam:        4.70 cm 2.31 cm/m   RA Area:     10.60 cm LA Vol (A2C):   77.2 ml 38.01 ml/m  RA Volume:   21.20 ml  10.44 ml/m LA Vol (A4C):   45.0 ml 22.15 ml/m LA Biplane Vol: 58.8 ml 28.95 ml/m  AORTIC VALVE LVOT Vmax:   129.00 cm/s LVOT Vmean:  88.100 cm/s LVOT VTI:    0.250 m  AORTA Ao Root diam: 3.00 cm Ao Asc diam:  3.30 cm MITRAL VALVE                TRICUSPID VALVE MV Area (PHT): 5.68 cm     TR Peak grad:   20.6 mmHg MV Area VTI:   3.18 cm     TR Vmax:        227.00 cm/s MV Peak grad:  8.1 mmHg MV Mean grad:  4.0 mmHg     SHUNTS MV Vmax:       1.42 m/s     Systemic VTI:  0.25 m MV Vmean:      86.9 cm/s    Systemic Diam: 2.20 cm MV Decel Time: 134 msec MV E velocity: 100.90 cm/s MV A velocity: 127.00 cm/s MV E/A ratio:  0.79 Dixie Dials MD Electronically signed by Dixie Dials  MD Signature Date/Time: 10/07/2021/9:23:08 AM    Final     Anti-infectives: Anti-infectives (From admission, onward)    Start     Dose/Rate Route Frequency Ordered Stop   10/08/21 1700  piperacillin-tazobactam (ZOSYN) IVPB 3.375 g        3.375 g 12.5 mL/hr over 240 Minutes Intravenous Every 8 hours 10/08/21 1147 10/09/21 1659   10/07/21 1000  piperacillin-tazobactam (ZOSYN) IVPB 3.375 g  Status:  Discontinued        3.375 g 12.5 mL/hr over 240 Minutes Intravenous Every 8 hours 10/07/21 0805 10/08/21 1147       Assessment/Plan: s/p Procedure(s): LAPAROSCOPIC CHOLECYSTECTOMY WITH INTRAOPERATIVE CHOLANGIOGRAM (N/A)  POD#1  LFT's improved, creatinine better From a surgical standpoint, she can be discharged home today. Will make follow-up arrangements   LOS: 4 days    Coralie Keens MD 10/09/2021

## 2021-10-12 ENCOUNTER — Telehealth: Payer: Self-pay | Admitting: *Deleted

## 2021-10-12 LAB — CULTURE, BLOOD (ROUTINE X 2)
Culture: NO GROWTH
Special Requests: ADEQUATE

## 2021-10-12 NOTE — Telephone Encounter (Signed)
Transition Care Management Follow-up Telephone Call Date of discharge and from where: 10/09/21 How have you been since you were released from the hospital? "OK" Any questions or concerns? No  Items Reviewed: Did the pt receive and understand the discharge instructions provided? Yes  Medications obtained and verified? Yes  Other? No  Any new allergies since your discharge? No  Dietary orders reviewed? Yes Do you have support at home? Yes   Home Care and Equipment/Supplies: Were home health services ordered? no If so, what is the name of the agency? Not applicable  Has the agency set up a time to come to the patient's home? not applicable Were any new equipment or medical supplies ordered?  No What is the name of the medical supply agency? Not applicable Were you able to get the supplies/equipment? not applicable Do you have any questions related to the use of the equipment or supplies? No  Functional Questionnaire: (I = Independent and D = Dependent) ADLs: I  Bathing/Dressing- I  Meal Prep- I  Eating- I  Maintaining continence- I  Transferring/Ambulation- I  Managing Meds- Assist from daughter who is s nurse  Follow up appointments reviewed:  PCP Hospital f/u appt confirmed? No  Advised patient to call Dr. Fransico Setters office and make a follow up appointment for the next 1-2 weeks since she was hospitalized with pancreatitis and was told to hold 4 of her maintenance medications upon d/c from hospital. [facility] f/u appt confirmed? Yes  Scheduled to see Dr. Ninfa Linden on 10/29/21 @ 2:45 pm. Are transportation arrangements needed? No  If their condition worsens, is the pt aware to call PCP or go to the Emergency Dept.? Yes Was the patient provided with contact information for the PCP's office or ED? Yes Was to pt encouraged to call back with questions or concerns? Yes   Kelli Churn RN, CCM, [facility] Care Management  Coordinator [phone]

## 2021-12-09 DIAGNOSIS — R072 Precordial pain: Secondary | ICD-10-CM | POA: Diagnosis not present

## 2021-12-09 DIAGNOSIS — I340 Nonrheumatic mitral (valve) insufficiency: Secondary | ICD-10-CM | POA: Diagnosis not present

## 2021-12-09 DIAGNOSIS — I10 Essential (primary) hypertension: Secondary | ICD-10-CM | POA: Diagnosis not present

## 2021-12-15 ENCOUNTER — Ambulatory Visit: Payer: Self-pay | Admitting: Surgery

## 2021-12-15 DIAGNOSIS — R1310 Dysphagia, unspecified: Secondary | ICD-10-CM | POA: Diagnosis not present

## 2021-12-15 DIAGNOSIS — J398 Other specified diseases of upper respiratory tract: Secondary | ICD-10-CM | POA: Diagnosis not present

## 2021-12-15 DIAGNOSIS — E049 Nontoxic goiter, unspecified: Secondary | ICD-10-CM | POA: Diagnosis not present

## 2021-12-15 DIAGNOSIS — E042 Nontoxic multinodular goiter: Secondary | ICD-10-CM | POA: Diagnosis not present

## 2021-12-16 NOTE — Progress Notes (Signed)
COVID Vaccine Completed:  Date of COVID positive in last 90 days:  PCP - Lucianne Lei, MD Cardiologist -   Chest x-ray - CT 09/21/21 Epic EKG - 10/06/21 Epic Stress Test - 03/22/14 Epic ECHO - 10/07/21 Epic Cardiac Cath -  Pacemaker/ICD device last checked: Spinal Cord Stimulator:  Bowel Prep -   Sleep Study -  CPAP -   Fasting Blood Sugar -  Checks Blood Sugar _____ times a day  Blood Thinner Instructions: Aspirin Instructions: Last Dose:  Activity level:  Can go up a flight of stairs and perform activities of daily living without stopping and without symptoms of chest pain or shortness of breath.  Able to exercise without symptoms  Unable to go up a flight of stairs without symptoms of     Anesthesia review:   Patient denies shortness of breath, fever, cough and chest pain at PAT appointment  Patient verbalized understanding of instructions that were given to them at the PAT appointment. Patient was also instructed that they will need to review over the PAT instructions again at home before surgery.

## 2021-12-16 NOTE — Patient Instructions (Signed)
SURGICAL WAITING ROOM VISITATION Patients having surgery or a procedure may have no more than 2 support people in the waiting area - these visitors may rotate.   Children under the age of 69 must have an adult with them who is not the patient. If the patient needs to stay at the hospital during part of their recovery, the visitor guidelines for inpatient rooms apply. Pre-op nurse will coordinate an appropriate time for 1 support person to accompany patient in pre-op.  This support person may not rotate.    Please refer to the [facility] website for the visitor guidelines for Inpatients (after your surgery is over and you are in a regular room).    Your procedure is scheduled on: 12/21/21   Report to [facility] Main Entrance    Report to admitting at 6:45 AM   Call this number if you have problems the morning of surgery [phone]   Do not eat food :After Midnight.   After Midnight you may have the following liquids until 6:00 AM DAY OF SURGERY  Water Non-Citrus Juices (without pulp, NO RED) Carbonated Beverages Black Coffee (NO MILK/CREAM OR CREAMERS, sugar ok)  Clear Tea (NO MILK/CREAM OR CREAMERS, sugar ok) regular and decaf                             Plain Jell-O (NO RED)                                           Fruit ices (not with fruit pulp, NO RED)                                     Popsicles (NO RED)                                                               Sports drinks like Gatorade (NO RED)               FOLLOW BOWEL PREP AND ANY ADDITIONAL PRE OP INSTRUCTIONS YOU RECEIVED FROM YOUR SURGEON'S OFFICE!!!     Oral Hygiene is also important to reduce your risk of infection.                                    Remember - BRUSH YOUR TEETH THE MORNING OF SURGERY WITH YOUR REGULAR TOOTHPASTE   Take these medicines the morning of surgery with A SIP OF WATER: Clonidine, Metoprolol                              You may not have any metal on your body including  hair pins, jewelry, and body piercing             Do not wear make-up, lotions, powders, perfumes, or deodorant  Do not wear nail polish including gel and S&S, artificial/acrylic nails, or any other type of covering on natural nails including finger and  toenails. If you have artificial nails, gel coating, etc. that needs to be removed by a nail salon please have this removed prior to surgery or surgery may need to be canceled/ delayed if the surgeon/ anesthesia feels like they are unable to be safely monitored.   Do not shave  48 hours prior to surgery.    Do not bring valuables to the hospital. [place].   Contacts, dentures or bridgework may not be worn into surgery.   Bring small overnight bag day of surgery.   DO NOT [place]. PHARMACY WILL DISPENSE MEDICATIONS LISTED ON YOUR MEDICATION LIST TO YOU DURING YOUR ADMISSION [place]!               Please read over the following fact sheets you were given: IF YOU HAVE QUESTIONS ABOUT YOUR PRE-OP INSTRUCTIONS PLEASE CALL [place] - Preparing for Surgery Before surgery, you can play an important role.  Because skin is not sterile, your skin needs to be as free of germs as possible.  You can reduce the number of germs on your skin by washing with CHG (chlorahexidine gluconate) soap before surgery.  CHG is an antiseptic cleaner which kills germs and bonds with the skin to continue killing germs even after washing. Please DO NOT use if you have an allergy to CHG or antibacterial soaps.  If your skin becomes reddened/irritated stop using the CHG and inform your nurse when you arrive at Short Stay. Do not shave (including legs and underarms) for at least 48 hours prior to the first CHG shower.  You may shave your face/neck.  Please follow these instructions carefully:  1.  Shower with CHG Soap the night before surgery and the   morning of surgery.  2.  If you choose to wash your hair, wash your hair first as usual with your normal  shampoo.  3.  After you shampoo, rinse your hair and body thoroughly to remove the shampoo.                             4.  Use CHG as you would any other liquid soap.  You can apply chg directly to the skin and wash.  Gently with a scrungie or clean washcloth.  5.  Apply the CHG Soap to your body ONLY FROM THE NECK DOWN.   Do   not use on face/ open                           Wound or open sores. Avoid contact with eyes, ears mouth and   genitals (private parts).                       Wash face,  Genitals (private parts) with your normal soap.             6.  Wash thoroughly, paying special attention to the area where your    surgery  will be performed.  7.  Thoroughly rinse your body with warm water from the neck down.  8.  DO NOT shower/wash with your normal soap after using and rinsing off the CHG Soap.  9.  Pat yourself dry with a clean towel.            10.  Wear clean pajamas.            11.  Place clean sheets on your bed the night of your first shower and do not  sleep with pets. Day of Surgery : Do not apply any lotions/deodorants the morning of surgery.  Please wear clean clothes to the hospital/surgery center.  FAILURE TO FOLLOW THESE INSTRUCTIONS MAY RESULT IN THE CANCELLATION OF YOUR SURGERY  PATIENT SIGNATURE_________________________________  NURSE SIGNATURE__________________________________  ________________________________________________________________________

## 2021-12-17 ENCOUNTER — Ambulatory Visit (HOSPITAL_COMMUNITY)
Admission: RE | Admit: 2021-12-17 | Discharge: 2021-12-17 | Disposition: A | Discharge status: Home / Self Care | Payer: Medicare HMO | Source: Ambulatory Visit | Attending: Anesthesiology | Admitting: Anesthesiology

## 2021-12-17 ENCOUNTER — Encounter (HOSPITAL_COMMUNITY): Payer: Self-pay

## 2021-12-17 ENCOUNTER — Encounter (HOSPITAL_COMMUNITY)
Admission: RE | Admit: 2021-12-17 | Discharge: 2021-12-17 | Disposition: A | Discharge status: Home / Self Care | Payer: Medicare HMO | Source: Ambulatory Visit | Attending: Surgery | Admitting: Surgery

## 2021-12-17 VITALS — BP 116/53 | HR 72 | Temp 97.9°F | Resp 16 | Ht 64.0 in | Wt 199.0 lb

## 2021-12-17 DIAGNOSIS — I2510 Atherosclerotic heart disease of native coronary artery without angina pectoris: Secondary | ICD-10-CM | POA: Diagnosis not present

## 2021-12-17 DIAGNOSIS — I10 Essential (primary) hypertension: Secondary | ICD-10-CM | POA: Diagnosis not present

## 2021-12-17 DIAGNOSIS — J398 Other specified diseases of upper respiratory tract: Secondary | ICD-10-CM | POA: Diagnosis not present

## 2021-12-17 DIAGNOSIS — E049 Nontoxic goiter, unspecified: Secondary | ICD-10-CM | POA: Diagnosis not present

## 2021-12-17 DIAGNOSIS — Z01812 Encounter for preprocedural laboratory examination: Secondary | ICD-10-CM | POA: Diagnosis not present

## 2021-12-17 DIAGNOSIS — Z8673 Personal history of transient ischemic attack (TIA), and cerebral infarction without residual deficits: Secondary | ICD-10-CM | POA: Insufficient documentation

## 2021-12-17 DIAGNOSIS — Z01818 Encounter for other preprocedural examination: Secondary | ICD-10-CM | POA: Diagnosis not present

## 2021-12-17 HISTORY — DX: Anemia, unspecified: D64.9

## 2021-12-17 LAB — BASIC METABOLIC PANEL
Anion gap: 7 (ref 5–15)
BUN: 30 mg/dL — ABNORMAL HIGH (ref 8–23)
CO2: 24 mmol/L (ref 22–32)
Calcium: 9.5 mg/dL (ref 8.9–10.3)
Chloride: 107 mmol/L (ref 98–111)
Creatinine, Ser: 1.76 mg/dL — ABNORMAL HIGH (ref 0.44–1.00)
GFR, Estimated: 31 mL/min — ABNORMAL LOW (ref >60)
Glucose, Bld: 105 mg/dL — ABNORMAL HIGH (ref 70–99)
Potassium: 4.7 mmol/L (ref 3.5–5.1)
Sodium: 138 mmol/L (ref 135–145)

## 2021-12-17 LAB — CBC
HCT: 30.3 % — ABNORMAL LOW (ref 36.0–46.0)
Hemoglobin: 9.6 g/dL — ABNORMAL LOW (ref 12.0–15.0)
MCH: 28.2 pg (ref 26.0–34.0)
MCHC: 31.7 g/dL (ref 30.0–36.0)
MCV: 88.9 fL (ref 80.0–100.0)
Platelets: 285 10*3/uL (ref 150–400)
RBC: 3.41 MIL/uL — ABNORMAL LOW (ref 3.87–5.11)
RDW: 15.4 % (ref 11.5–15.5)
WBC: 4.8 10*3/uL (ref 4.0–10.5)
nRBC: 0 % (ref 0.0–0.2)

## 2021-12-18 NOTE — Anesthesia Preprocedure Evaluation (Signed)
Anesthesia Evaluation  Patient identified by MRN, date of birth, ID band Patient awake    Reviewed: Allergy & Precautions, NPO status , Patient's Chart, lab work & pertinent test results  Airway Mallampati: II  TM Distance: >3 FB Neck ROM: Full    Dental no notable dental hx.    Pulmonary neg pulmonary ROS,    Pulmonary exam normal breath sounds clear to auscultation       Cardiovascular hypertension, Pt. on medications + CAD  Normal cardiovascular exam Rhythm:Regular Rate:Normal     Neuro/Psych Anxiety CVA negative psych ROS   GI/Hepatic negative GI ROS, Neg liver ROS,   Endo/Other  negative endocrine ROS  Renal/GU negative Renal ROS  negative genitourinary   Musculoskeletal negative musculoskeletal ROS (+)   Abdominal (+) + obese,   Peds negative pediatric ROS (+)  Hematology negative hematology ROS (+)   Anesthesia Other Findings   Reproductive/Obstetrics negative OB ROS                            Anesthesia Physical Anesthesia Plan  ASA: 3  Anesthesia Plan: General   Post-op Pain Management: Dilaudid IV   Induction: Intravenous  PONV Risk Score and Plan: 3 and Ondansetron, Dexamethasone, Midazolam and Treatment may vary due to age or medical condition  Airway Management Planned: Oral ETT  Additional Equipment:   Intra-op Plan:   Post-operative Plan: Extubation in OR  Informed Consent: I have reviewed the patients History and Physical, chart, labs and discussed the procedure including the risks, benefits and alternatives for the proposed anesthesia with the patient or authorized representative who has indicated his/her understanding and acceptance.     Dental advisory given  Plan Discussed with: CRNA  Anesthesia Plan Comments: (See APP note by Durel Salts, FNP. Thyroid goiter impacting airway. )       Anesthesia Quick Evaluation

## 2021-12-18 NOTE — Progress Notes (Signed)
Anesthesia Chart Review:   Case: [id number] Date/Time: 12/21/21 0845   Procedure: TOTAL THYROIDECTOMY   Anesthesia type: General   Pre-op diagnosis: MULTINODULAR THYROID GOITER WITH TRACHEAL DEVIATION   Location[facility] ROOM 01 / [facility]   Surgeons: Armandina Gemma, MD       DISCUSSION: Pt is 71 years old with hx CAD, HTN, CVA, thyroid goiter, anemia  Hospitalized 5/29-10/09/21 for acute pancreatitis, AKI. Underwent cholecystectomy 10/08/21  Pt has thyroid goiter that is displacing her trachea. See CT scan results below. Intubation note 10/08/21 documents successful intubation on 1 attempt using Glidescope and 4, 7.5 mm ETT.  Future Recommendations: Recommend- induction with short-acting agent, and alternative techniques readily available and Recommend- awake intubation Comments: Due to known airway concerns, anesthesia plan for is for an awake intubation. After pre-medication, Patient spontaneously breathing and DLx 1 with Glidescpe 4. Noted airway deviation but grade 1 view observed with Glidescope view.  ETT 7.0 gently passed through cords and placement confirmed.  VS: BP (!) 116/53   Pulse 72   Temp 36.6 C (Oral)   Resp 16   Ht '5\' 4"'$  (1.626 m)   Wt 90.3 kg   SpO2 99%   BMI 34.16 kg/m   PROVIDERS: - PCP is Lucianne Lei, MD - Cardiologist is Dixie Dials, MD   LABS: Labs reviewed: Acceptable for surgery. - Cr 1.76. Consistent with recent results. Per discharge summary 10/09/21, usual baseline Cr ~1.3 - Hgb 9.6/Hct 30.3. Consistent with recent results. Hgb was 10.3 on admission 10/05/21  (all labs ordered are listed, but only abnormal results are displayed)  Labs Reviewed  BASIC METABOLIC PANEL - Abnormal; Notable for the following components:      Result Value   Glucose, Bld 105 (*)    BUN 30 (*)    Creatinine, Ser 1.76 (*)    GFR, Estimated 31 (*)    All other components within normal limits  CBC - Abnormal; Notable for the following components:   RBC 3.41 (*)    Hemoglobin 9.6  (*)    HCT 30.3 (*)    All other components within normal limits     IMAGES: CXR 12/17/21:  - No active cardiopulmonary disease  CT soft tissue neck 09/21/21:  1. Multinodular goiter, with significant increase in the size of the left thyroid lobe and isthmus compared to the 2019 CT, but likely similar to the 05/25/2021 ultrasound. This causes rightward deviation of the larynx and trachea, as well as compression of the esophagus. 2. No other acute finding in the neck.  CT angio chest 09/21/21:  - Multinodular left thyroid mass and has significantly increased in the interim. Rightward deviation of the trachea. Esophagus at the region of the thyroid mass appears to be compressed. Recommend ultrasound examination and ultrasound guided biopsy of the left thyroid mass. - Severe atheromatous calcifications of the thoracic aorta. Mild dilation of the main pulmonary artery.  Thyroid US 1/61/23:  1. Thyromegaly with findings suggestive of multinodular goiter. 2. None of the discretely measured thyroid nodules/cysts meet imaging criteria to recommend percutaneous sampling or continued dedicated follow-up.   EKG 10/06/21: NSR. Nonspecific ST abnormality   CV: Echo 10/07/21:  1. Left ventricular ejection fraction, by estimation, is 60 to 65%. The left ventricle has normal function. The left ventricle has no regional wall motion abnormalities. Left ventricular diastolic parameters are consistent with Grade I diastolic dysfunction (impaired relaxation).  2. Right ventricular systolic function is normal. The right ventricular size is normal. There is  normal pulmonary artery systolic pressure.  3. Left atrial size was moderately dilated.  4. The pericardial effusion is circumferential. There is no evidence of cardiac tamponade.  5. The mitral valve is degenerative. Mild mitral valve regurgitation.  6. The aortic valve is tricuspid. There is mild calcification of the aortic valve. There is mild thickening  of the aortic valve. Aortic valve regurgitation is not visualized. Aortic valve sclerosis is present, with no evidence of aortic valve stenosis.  7. The inferior vena cava is dilated in size with >50% respiratory variability, suggesting right atrial pressure of 8 mmHg.    Past Medical History:  Diagnosis Date   Anemia    Coronary artery disease    Dyspnea    Hyperlipemia    Hypertension    Lower extremity edema    Multinodular goiter    causing tracheal deviation   Stroke [facility])     Past Surgical History:  Procedure Laterality Date   CHOLECYSTECTOMY N/A 10/08/2021   Procedure: LAPAROSCOPIC CHOLECYSTECTOMY WITH INTRAOPERATIVE CHOLANGIOGRAM;  Surgeon: Coralie Keens, MD;  Location: [facility];  Service: General;  Laterality: N/A;   TOTAL KNEE ARTHROPLASTY  05/10/2005   right knee   TUBAL LIGATION      MEDICATIONS:  acetaminophen (TYLENOL 8 HOUR) 650 MG CR tablet   amLODipine (NORVASC) 10 MG tablet   aspirin 81 MG tablet   atorvastatin (LIPITOR) 40 MG tablet   chlorthalidone (HYGROTON) 25 MG tablet   cholecalciferol (VITAMIN D3) 25 MCG (1000 UNIT) tablet   cloNIDine (CATAPRES) 0.2 MG tablet   diphenhydramine-acetaminophen (TYLENOL PM) 25-500 MG TABS tablet   hydrALAZINE (APRESOLINE) 50 MG tablet   hydrOXYzine (ATARAX) 25 MG tablet   isosorbide mononitrate (IMDUR) 60 MG 24 hr tablet   lisinopril (ZESTRIL) 20 MG tablet   Magnesium 400 MG TABS   metoprolol tartrate (LOPRESSOR) 50 MG tablet   Omega-3 Fatty Acids (FISH OIL PO)   oxyCODONE (OXY IR/ROXICODONE) 5 MG immediate release tablet   potassium chloride (KLOR-CON) 10 MEQ tablet   No current facility-administered medications for this encounter.    If no changes, I anticipate pt can proceed with surgery as scheduled.   Willeen Cass, PhD, FNP-BC [facility] Short Stay Surgical Center/Anesthesiology Phone: [phone] 12/18/2021 8:34 AM

## 2021-12-20 ENCOUNTER — Encounter (HOSPITAL_COMMUNITY): Payer: Self-pay | Admitting: Surgery

## 2021-12-20 DIAGNOSIS — R1310 Dysphagia, unspecified: Secondary | ICD-10-CM

## 2021-12-20 DIAGNOSIS — J398 Other specified diseases of upper respiratory tract: Secondary | ICD-10-CM | POA: Diagnosis present

## 2021-12-20 NOTE — H&P (Signed)
PROVIDER: Lamaya Hyneman Charlotta Newton, MD   Chief Complaint: Follow-up (Multinodular goiter with compressive symptoms, worse)  History of Present Illness:  Patient returns for follow-up of multinodular thyroid goiter with tracheal deviation and progressive compressive symptoms. Patient was originally seen in April 2023. At my request she underwent a CT scan of the neck and chest on Sep 21, 2021. This demonstrated a multinodular thyroid goiter with the left lobe measuring up to 10.5 cm in maximum dimension in the right lobe measuring up to 5.8 cm in dimension. There was deviation of the larynx and trachea to the right side. There was also noted to be compression of the esophagus. Patient has had progressive dysphagia. She is having problems taking pills as well as eating bread. She also notes some shortness of breath with exertion. She returns today accompanied by her daughter, who is a Marine scientist, to discuss proceeding with thyroidectomy.   Review of Systems: A complete review of systems was obtained from the patient. I have reviewed this information and discussed as appropriate with the patient. See HPI as well for other ROS.  Review of Systems  Constitutional: Positive for malaise/fatigue.  HENT: Negative.  Eyes: Negative.  Respiratory: Positive for shortness of breath.  Cardiovascular: Negative.  Gastrointestinal:  Dysphagia  Genitourinary: Negative.  Musculoskeletal: Negative.  Skin: Negative.  Neurological: Negative.  Endo/Heme/Allergies: Negative.  Psychiatric/Behavioral: Negative.   Medical History: Past Medical History:  Diagnosis Date  History of stroke  Hyperlipidemia  Hypertension  Thyroid disease   Patient Active Problem List  Diagnosis  Multiple thyroid nodules  Enlarged thyroid  Tracheal deviation  Dysphagia   Past Surgical History:  Procedure Laterality Date  Knee Surgery 05/10/2005  CHOLECYSTECTOMY 10/08/2021  knee replacement surgery N/A  LAPAROSCOPIC TUBAL  LIGATION    No Known Allergies  Current Outpatient Medications on File Prior to Visit  Medication Sig Dispense Refill  amLODIPine (NORVASC) 10 MG tablet Take 10 mg by mouth once daily  atorvastatin (LIPITOR) 40 MG tablet TAKE 1 TABLET BY MOUTH EVERY DAY AT 6 PM.  chlorthalidone 25 MG tablet Take 25 mg by mouth once daily  cloNIDine HCL (CATAPRES) 0.2 MG tablet Take 0.2 mg by mouth 2 (two) times daily  hydrALAZINE (APRESOLINE) 50 MG tablet Take 50 mg by mouth 2 (two) times daily  lisinopriL (ZESTRIL) 20 MG tablet Take 20 mg by mouth once daily  metoprolol tartrate (LOPRESSOR) 50 MG tablet Take 50 mg by mouth 2 (two) times daily  potassium chloride (KLOR-CON) 10 MEQ ER tablet Take 10 mEq by mouth once daily   No current facility-administered medications on file prior to visit.   Family History  Problem Relation Age of Onset  Stroke Mother  Obesity Mother  High blood pressure (Hypertension) Mother  Hyperlipidemia (Elevated cholesterol) Mother  Heart valve disease Mother  Obesity Father  High blood pressure (Hypertension) Father  Hyperlipidemia (Elevated cholesterol) Father  Stroke Sister    Social History   Tobacco Use  Smoking Status Never  Smokeless Tobacco Never    Social History   Socioeconomic History  Marital status: Single  Tobacco Use  Smoking status: Never  Smokeless tobacco: Never  Vaping Use  Vaping Use: Never used  Substance and Sexual Activity  Alcohol use: Never  Drug use: Never   Objective:   Physical Exam   GENERAL APPEARANCE Comfortable, no acute issues Development: normal Gross deformities: none  SKIN Rash, lesions, ulcers: none Induration, erythema: none Nodules: none palpable  EYES Conjunctiva and lids: normal  Pupils: equal and reactive  EARS, NOSE, MOUTH, THROAT External ears: no lesion or deformity External nose: no lesion or deformity Hearing: grossly normal  NECK Symmetric: no Trachea: deviation to the right Thyroid:  There is marked enlargement of the left thyroid lobe measuring at least 10 cm in greatest dimension, protruding anteriorly, and extending beneath the left clavicle. Right thyroid lobe is nodular and enlarged and firm without discrete or dominant mass. Voice is slightly hoarse.  CHEST Respiratory effort: normal Retraction or accessory muscle use: no Breath sounds: normal bilaterally Rales, rhonchi, wheeze: none  CARDIOVASCULAR Auscultation: regular rhythm, normal rate Murmurs: none Pulses: radial pulse 2+ palpable Lower extremity edema: none  ABDOMEN Not assessed  GENITOURINARY/RECTAL Not assessed  MUSCULOSKELETAL Station and gait: normal Digits and nails: no clubbing or cyanosis Muscle strength: grossly normal all extremities Range of motion: grossly normal all extremities Deformity: none  LYMPHATIC Cervical: none palpable Supraclavicular: none palpable  PSYCHIATRIC Oriented to person, place, and time: yes Mood and affect: normal for situation Judgment and insight: appropriate for situation   Assessment and Plan:   Tracheal deviation  Multiple thyroid nodules  Enlarged thyroid  Dysphagia, unspecified type  Patient returns today accompanied by her daughter to discuss proceeding with thyroidectomy. Patient had undergone the CT scan of the neck and chest in May. Today we reviewed that study together including a review of the images so that they could see the extent of this thyroid gland and the deviation of the larynx, airway, and esophagus.  Patient has had progressive symptoms with developing dysphagia and has had some intermittent hoarseness, particularly in the mornings. She would like to proceed with thyroidectomy at this time.  Based on her exam and on her ultrasound and CT scan, I have recommended proceeding with total thyroidectomy. We would certainly start with the left side as it is the most significant. If there were any problems during the initial part of  the surgery, we would only do a left thyroid lobectomy. The patient may or may not need a drain depending on findings at the time of surgery. We discussed the risk and benefits of the surgery including the risk of recurrent laryngeal nerve injury with alteration in voice quality and we discussed injury to parathyroid glands causing issues with her calcium levels postoperatively. We discussed the size and location of the surgical incision. We discussed the hospital stay to be anticipated. Patient would require lifelong thyroid hormone replacement. The patient and her daughter understand and wish to proceed with surgery in the near future.   Armandina Gemma, MD [facility] [place] practice Office: [phone]

## 2021-12-21 ENCOUNTER — Other Ambulatory Visit: Payer: Self-pay

## 2021-12-21 ENCOUNTER — Ambulatory Visit (HOSPITAL_COMMUNITY): Payer: Medicare HMO | Admitting: Emergency Medicine

## 2021-12-21 ENCOUNTER — Ambulatory Visit (HOSPITAL_COMMUNITY)
Admission: RE | Admit: 2021-12-21 | Discharge: 2021-12-22 | Disposition: A | Discharge status: Home / Self Care | Payer: Medicare HMO | Attending: Surgery | Admitting: Surgery

## 2021-12-21 ENCOUNTER — Encounter (HOSPITAL_COMMUNITY)
Admission: RE | Disposition: A | Discharge status: Home / Self Care | Payer: Self-pay | Source: Home / Self Care | Attending: Surgery

## 2021-12-21 ENCOUNTER — Ambulatory Visit (HOSPITAL_BASED_OUTPATIENT_CLINIC_OR_DEPARTMENT_OTHER): Payer: Medicare HMO | Admitting: Certified Registered Nurse Anesthetist

## 2021-12-21 ENCOUNTER — Encounter (HOSPITAL_COMMUNITY): Payer: Self-pay | Admitting: Surgery

## 2021-12-21 DIAGNOSIS — E042 Nontoxic multinodular goiter: Secondary | ICD-10-CM | POA: Diagnosis present

## 2021-12-21 DIAGNOSIS — I2510 Atherosclerotic heart disease of native coronary artery without angina pectoris: Secondary | ICD-10-CM | POA: Diagnosis not present

## 2021-12-21 DIAGNOSIS — E049 Nontoxic goiter, unspecified: Secondary | ICD-10-CM | POA: Diagnosis present

## 2021-12-21 DIAGNOSIS — E669 Obesity, unspecified: Secondary | ICD-10-CM | POA: Insufficient documentation

## 2021-12-21 DIAGNOSIS — D34 Benign neoplasm of thyroid gland: Secondary | ICD-10-CM | POA: Diagnosis not present

## 2021-12-21 DIAGNOSIS — Z6834 Body mass index (BMI) 34.0-34.9, adult: Secondary | ICD-10-CM | POA: Diagnosis not present

## 2021-12-21 DIAGNOSIS — I10 Essential (primary) hypertension: Secondary | ICD-10-CM | POA: Diagnosis not present

## 2021-12-21 DIAGNOSIS — J398 Other specified diseases of upper respiratory tract: Secondary | ICD-10-CM | POA: Diagnosis present

## 2021-12-21 DIAGNOSIS — Z01818 Encounter for other preprocedural examination: Secondary | ICD-10-CM

## 2021-12-21 DIAGNOSIS — Z8673 Personal history of transient ischemic attack (TIA), and cerebral infarction without residual deficits: Secondary | ICD-10-CM | POA: Insufficient documentation

## 2021-12-21 DIAGNOSIS — F419 Anxiety disorder, unspecified: Secondary | ICD-10-CM | POA: Diagnosis not present

## 2021-12-21 DIAGNOSIS — R1310 Dysphagia, unspecified: Secondary | ICD-10-CM

## 2021-12-21 HISTORY — PX: THYROIDECTOMY: SHX17

## 2021-12-21 SURGERY — THYROIDECTOMY
Anesthesia: General

## 2021-12-21 MED ORDER — OXYCODONE HCL 5 MG/5ML PO SOLN: 5.0000 mg | Freq: Once | ORAL | Status: AC | PRN

## 2021-12-21 MED ORDER — EPHEDRINE SULFATE-NACL 50-0.9 MG/10ML-% IV SOSY
PREFILLED_SYRINGE | INTRAVENOUS | Status: DC | PRN
  Administered 2021-12-21 (×2): 5 mg via INTRAVENOUS

## 2021-12-21 MED ORDER — TRAMADOL HCL 50 MG PO TABS: 50.0000 mg | ORAL_TABLET | Freq: Four times a day (QID) | ORAL | Status: DC | PRN

## 2021-12-21 MED ORDER — ROCURONIUM BROMIDE 10 MG/ML (PF) SYRINGE
PREFILLED_SYRINGE | INTRAVENOUS | Status: DC | PRN
  Administered 2021-12-21: 80 mg via INTRAVENOUS
  Administered 2021-12-21: 20 mg via INTRAVENOUS

## 2021-12-21 MED ORDER — METOPROLOL TARTRATE 50 MG PO TABS
50.0000 mg | ORAL_TABLET | Freq: Two times a day (BID) | ORAL | Status: DC
  Administered 2021-12-21 – 2021-12-22 (×2): 50 mg via ORAL
  Filled 2021-12-21 (×2): qty 1

## 2021-12-21 MED ORDER — ORAL CARE MOUTH RINSE: 15.0000 mL | Freq: Once | OROMUCOSAL | Status: AC

## 2021-12-21 MED ORDER — SODIUM CHLORIDE 0.45 % IV SOLN: INTRAVENOUS | Status: DC

## 2021-12-21 MED ORDER — ROCURONIUM BROMIDE 10 MG/ML (PF) SYRINGE
PREFILLED_SYRINGE | INTRAVENOUS | Status: AC
Start: 2021-12-21 — End: ?
  Filled 2021-12-21: qty 10

## 2021-12-21 MED ORDER — PROPOFOL 10 MG/ML IV BOLUS
INTRAVENOUS | Status: DC | PRN
  Administered 2021-12-21: 160 mg via INTRAVENOUS

## 2021-12-21 MED ORDER — MIDAZOLAM HCL 2 MG/2ML IJ SOLN
INTRAMUSCULAR | Status: AC
  Filled 2021-12-21: qty 2

## 2021-12-21 MED ORDER — BUPIVACAINE LIPOSOME 1.3 % IJ SUSP
INTRAMUSCULAR | Status: AC
  Filled 2021-12-21: qty 20

## 2021-12-21 MED ORDER — ONDANSETRON HCL 4 MG/2ML IJ SOLN: 4.0000 mg | Freq: Four times a day (QID) | INTRAMUSCULAR | Status: DC | PRN

## 2021-12-21 MED ORDER — ACETAMINOPHEN 325 MG PO TABS: 650.0000 mg | ORAL_TABLET | Freq: Four times a day (QID) | ORAL | Status: DC | PRN

## 2021-12-21 MED ORDER — MIDAZOLAM HCL 5 MG/5ML IJ SOLN
INTRAMUSCULAR | Status: DC | PRN
  Administered 2021-12-21: 2 mg via INTRAVENOUS

## 2021-12-21 MED ORDER — FENTANYL CITRATE (PF) 100 MCG/2ML IJ SOLN
INTRAMUSCULAR | Status: DC | PRN
  Administered 2021-12-21: 100 ug via INTRAVENOUS
  Administered 2021-12-21 (×2): 50 ug via INTRAVENOUS

## 2021-12-21 MED ORDER — AMLODIPINE BESYLATE 10 MG PO TABS
10.0000 mg | ORAL_TABLET | Freq: Every day | ORAL | Status: DC
  Administered 2021-12-21: 10 mg via ORAL
  Filled 2021-12-21: qty 1

## 2021-12-21 MED ORDER — AMISULPRIDE (ANTIEMETIC) 5 MG/2ML IV SOLN: 10.0000 mg | Freq: Once | INTRAVENOUS | Status: DC | PRN

## 2021-12-21 MED ORDER — HEMOSTATIC AGENTS (NO CHARGE) OPTIME
TOPICAL | Status: DC | PRN
  Administered 2021-12-21: 1 via TOPICAL

## 2021-12-21 MED ORDER — CEFAZOLIN SODIUM-DEXTROSE 2-4 GM/100ML-% IV SOLN
2.0000 g | INTRAVENOUS | Status: AC
  Administered 2021-12-21: 2 g via INTRAVENOUS
  Filled 2021-12-21: qty 100

## 2021-12-21 MED ORDER — ONDANSETRON HCL 4 MG/2ML IJ SOLN
INTRAMUSCULAR | Status: DC | PRN
  Administered 2021-12-21: 4 mg via INTRAVENOUS

## 2021-12-21 MED ORDER — ACETAMINOPHEN 650 MG RE SUPP: 650.0000 mg | Freq: Four times a day (QID) | RECTAL | Status: DC | PRN

## 2021-12-21 MED ORDER — CHLORTHALIDONE 25 MG PO TABS
25.0000 mg | ORAL_TABLET | Freq: Every day | ORAL | Status: DC
  Administered 2021-12-22: 25 mg via ORAL
  Filled 2021-12-21: qty 1

## 2021-12-21 MED ORDER — EPHEDRINE 5 MG/ML INJ
INTRAVENOUS | Status: AC
Start: 2021-12-21 — End: ?
  Filled 2021-12-21: qty 5

## 2021-12-21 MED ORDER — GLYCOPYRROLATE 0.2 MG/ML IJ SOLN
INTRAMUSCULAR | Status: AC
  Filled 2021-12-21: qty 1

## 2021-12-21 MED ORDER — DEXAMETHASONE SODIUM PHOSPHATE 4 MG/ML IJ SOLN
INTRAMUSCULAR | Status: DC | PRN
  Administered 2021-12-21: 5 mg via INTRAVENOUS

## 2021-12-21 MED ORDER — HYDRALAZINE HCL 25 MG PO TABS
25.0000 mg | ORAL_TABLET | Freq: Two times a day (BID) | ORAL | Status: DC
  Administered 2021-12-21 – 2021-12-22 (×2): 25 mg via ORAL
  Filled 2021-12-21 (×2): qty 1

## 2021-12-21 MED ORDER — OXYCODONE HCL 5 MG PO TABS
5.0000 mg | ORAL_TABLET | ORAL | Status: DC | PRN
  Administered 2021-12-21 – 2021-12-22 (×2): 10 mg via ORAL
  Filled 2021-12-21 (×2): qty 2

## 2021-12-21 MED ORDER — LACTATED RINGERS IV SOLN: INTRAVENOUS | Status: DC

## 2021-12-21 MED ORDER — PROMETHAZINE HCL 25 MG/ML IJ SOLN: 6.2500 mg | INTRAMUSCULAR | Status: DC | PRN

## 2021-12-21 MED ORDER — FENTANYL CITRATE (PF) 250 MCG/5ML IJ SOLN
INTRAMUSCULAR | Status: AC
  Filled 2021-12-21: qty 5

## 2021-12-21 MED ORDER — CHLORHEXIDINE GLUCONATE CLOTH 2 % EX PADS: 6.0000 | MEDICATED_PAD | Freq: Once | CUTANEOUS | Status: DC

## 2021-12-21 MED ORDER — OXYCODONE HCL 5 MG PO TABS
ORAL_TABLET | ORAL | Status: AC
  Filled 2021-12-21: qty 1

## 2021-12-21 MED ORDER — OXYCODONE HCL 5 MG PO TABS
5.0000 mg | ORAL_TABLET | Freq: Once | ORAL | Status: AC | PRN
  Administered 2021-12-21: 5 mg via ORAL

## 2021-12-21 MED ORDER — LIDOCAINE 2% (20 MG/ML) 5 ML SYRINGE
INTRAMUSCULAR | Status: DC | PRN
  Administered 2021-12-21: 60 mg via INTRAVENOUS

## 2021-12-21 MED ORDER — ONDANSETRON 4 MG PO TBDP: 4.0000 mg | ORAL_TABLET | Freq: Four times a day (QID) | ORAL | Status: DC | PRN

## 2021-12-21 MED ORDER — ONDANSETRON HCL 4 MG/2ML IJ SOLN
INTRAMUSCULAR | Status: AC
  Filled 2021-12-21: qty 2

## 2021-12-21 MED ORDER — CALCIUM CARBONATE 1250 (500 CA) MG PO TABS
2.0000 | ORAL_TABLET | Freq: Three times a day (TID) | ORAL | Status: DC
  Administered 2021-12-21 – 2021-12-22 (×2): 2500 mg via ORAL
  Filled 2021-12-21 (×2): qty 2

## 2021-12-21 MED ORDER — ISOSORBIDE MONONITRATE ER 30 MG PO TB24
30.0000 mg | ORAL_TABLET | Freq: Every day | ORAL | Status: DC
  Administered 2021-12-21: 30 mg via ORAL
  Filled 2021-12-21: qty 1

## 2021-12-21 MED ORDER — CLONIDINE HCL 0.1 MG PO TABS
0.2000 mg | ORAL_TABLET | Freq: Two times a day (BID) | ORAL | Status: DC
  Administered 2021-12-21 – 2021-12-22 (×2): 0.2 mg via ORAL
  Filled 2021-12-21 (×2): qty 2

## 2021-12-21 MED ORDER — DEXAMETHASONE SODIUM PHOSPHATE 10 MG/ML IJ SOLN
INTRAMUSCULAR | Status: AC
  Filled 2021-12-21: qty 1

## 2021-12-21 MED ORDER — HYDROMORPHONE HCL 1 MG/ML IJ SOLN
0.2500 mg | INTRAMUSCULAR | Status: DC | PRN
  Administered 2021-12-21: 0.5 mg via INTRAVENOUS

## 2021-12-21 MED ORDER — 0.9 % SODIUM CHLORIDE (POUR BTL) OPTIME
TOPICAL | Status: DC | PRN
  Administered 2021-12-21: 1000 mL

## 2021-12-21 MED ORDER — CHLORHEXIDINE GLUCONATE 0.12 % MT SOLN
15.0000 mL | Freq: Once | OROMUCOSAL | Status: AC
  Administered 2021-12-21: 15 mL via OROMUCOSAL

## 2021-12-21 MED ORDER — SUGAMMADEX SODIUM 200 MG/2ML IV SOLN
INTRAVENOUS | Status: DC | PRN
  Administered 2021-12-21: 200 mg via INTRAVENOUS

## 2021-12-21 MED ORDER — LIDOCAINE 2% (20 MG/ML) 5 ML SYRINGE
INTRAMUSCULAR | Status: AC
  Filled 2021-12-21: qty 5

## 2021-12-21 MED ORDER — HYDROMORPHONE HCL 1 MG/ML IJ SOLN
INTRAMUSCULAR | Status: AC
  Administered 2021-12-21: 0.5 mg via INTRAVENOUS
  Filled 2021-12-21: qty 1

## 2021-12-21 MED ORDER — HYDROMORPHONE HCL 1 MG/ML IJ SOLN
1.0000 mg | INTRAMUSCULAR | Status: DC | PRN
  Administered 2021-12-21: 1 mg via INTRAVENOUS
  Filled 2021-12-21: qty 1

## 2021-12-21 MED ORDER — PROPOFOL 10 MG/ML IV BOLUS
INTRAVENOUS | Status: AC
  Filled 2021-12-21: qty 20

## 2021-12-21 SURGICAL SUPPLY — 32 items
ATTRACTOMAT 16X20 MAGNETIC DRP (DRAPES) ×2 IMPLANT
BAG COUNTER SPONGE SURGICOUNT (BAG) ×1 IMPLANT
BLADE SURG 15 STRL LF DISP TIS (BLADE) ×1 IMPLANT
BLADE SURG 15 STRL SS (BLADE) ×2
CHLORAPREP W/TINT 26 (MISCELLANEOUS) ×2 IMPLANT
CLIP TI MEDIUM 6 (CLIP) ×5 IMPLANT
CLIP TI WIDE RED SMALL 6 (CLIP) ×6 IMPLANT
COVER SURGICAL LIGHT HANDLE (MISCELLANEOUS) ×2 IMPLANT
DERMABOND ADVANCED (GAUZE/BANDAGES/DRESSINGS) ×1
DERMABOND ADVANCED .7 DNX12 (GAUZE/BANDAGES/DRESSINGS) ×1 IMPLANT
DRAPE LAPAROTOMY T 98X78 PEDS (DRAPES) ×2 IMPLANT
DRAPE UTILITY XL STRL (DRAPES) ×2 IMPLANT
ELECT PENCIL ROCKER SW 15FT (MISCELLANEOUS) ×2 IMPLANT
ELECT REM PT RETURN 15FT ADLT (MISCELLANEOUS) ×2 IMPLANT
GAUZE 4X4 16PLY ~~LOC~~+RFID DBL (SPONGE) ×2 IMPLANT
GLOVE SURG ORTHO 8.0 STRL STRW (GLOVE) ×2 IMPLANT
GOWN STRL REUS W/ TWL XL LVL3 (GOWN DISPOSABLE) ×2 IMPLANT
GOWN STRL REUS W/TWL XL LVL3 (GOWN DISPOSABLE) ×4
HEMOSTAT SURGICEL 2X4 FIBR (HEMOSTASIS) ×2 IMPLANT
ILLUMINATOR WAVEGUIDE N/F (MISCELLANEOUS) ×2 IMPLANT
KIT BASIN OR (CUSTOM PROCEDURE TRAY) ×2 IMPLANT
KIT TURNOVER KIT A (KITS) ×1 IMPLANT
PACK BASIC VI WITH GOWN DISP (CUSTOM PROCEDURE TRAY) ×2 IMPLANT
SHEARS HARMONIC 9CM CVD (BLADE) ×2 IMPLANT
SUT MNCRL AB 4-0 PS2 18 (SUTURE) ×2 IMPLANT
SUT SILK 2 0 (SUTURE) ×2
SUT SILK 2-0 18XBRD TIE 12 (SUTURE) IMPLANT
SUT VIC AB 3-0 SH 18 (SUTURE) ×4 IMPLANT
SYR BULB IRRIG 60ML STRL (SYRINGE) ×2 IMPLANT
TOWEL OR 17X26 10 PK STRL BLUE (TOWEL DISPOSABLE) ×2 IMPLANT
TOWEL OR NON WOVEN STRL DISP B (DISPOSABLE) ×2 IMPLANT
TUBING CONNECTING 10 (TUBING) ×2 IMPLANT

## 2021-12-21 NOTE — Op Note (Signed)
Procedure Note  Pre-operative Diagnosis:  Multinodular thyroid goiter with tracheal deviation  Post-operative Diagnosis:  same  Surgeon:  Armandina Gemma, MD  Assistant:  none   Procedure:  Total thyroidectomy  Anesthesia:  General  Estimated Blood Loss:  minimal  Drains: none         Specimen: thyroid to pathology  Indications:  Patient returns for follow-up of multinodular thyroid goiter with tracheal deviation and progressive compressive symptoms. Patient was originally seen in April 2023. At my request she underwent a CT scan of the neck and chest on Sep 21, 2021. This demonstrated a multinodular thyroid goiter with the left lobe measuring up to 10.5 cm in maximum dimension in the right lobe measuring up to 5.8 cm in dimension. There was deviation of the larynx and trachea to the right side. There was also noted to be compression of the esophagus. Patient has had progressive dysphagia. She is having problems taking pills as well as eating bread. She also notes some shortness of breath with exertion. She returns today accompanied by her daughter, who is a Marine scientist, to discuss proceeding with thyroidectomy.   Procedure Details: Procedure was done in OR #1 at the [facility]. The patient was brought to the operating room and placed in a supine position on the operating room table. Following administration of general anesthesia, the patient was positioned and then prepped and draped in the usual aseptic fashion. After ascertaining that an adequate level of anesthesia had been achieved, a small Kocher incision was made with #15 blade. Dissection was carried through subcutaneous tissues and platysma.Hemostasis was achieved with the electrocautery. Skin flaps were elevated cephalad and caudad from the thyroid notch to the sternal notch. A [place] self-retaining retractor was placed for exposure. Strap muscles were incised in the midline and dissection was begun on the left side.  Strap muscles  were reflected laterally.  Left thyroid lobe was markely enlarged and multinodular.  It displaced the trachea to the right of midline.  The left lobe was gently mobilized with blunt dissection. Superior pole vessels were dissected out and divided individually between small and medium ligaclips with the harmonic scalpel. The thyroid lobe was rolled anteriorly. Branches of the inferior thyroid artery were divided between small ligaclips with the harmonic scalpel. Inferior venous tributaries were divided between ligaclips. The recurrent laryngeal nerve was preserved along its course. The ligament of Gwenlyn Found was released with the electrocautery and the gland was mobilized onto the anterior trachea. Isthmus was mobilized across the midline. There was a moderate sized pyramidal lobe present which was resected with the isthmus. Dry pack was placed in the left neck.  The right thyroid lobe was gently mobilized with blunt dissection. Right thyroid lobe was minimally increased in size and multinodular. Superior pole vessels were dissected out and divided between small and medium ligaclips with the Harmonic scalpel. Superior parathyroid was identified and preserved. Inferior venous tributaries were divided between medium ligaclips with the harmonic scalpel. The right thyroid lobe was rolled anteriorly and the branches of the inferior thyroid artery divided between small ligaclips. The right recurrent laryngeal nerve was identified and preserved along its course. The ligament of Gwenlyn Found was released with the electrocautery. The right thyroid lobe was mobilized onto the anterior trachea and the remainder of the thyroid was dissected off the anterior trachea and the thyroid was completely excised. A suture was used to mark the right lobe. The entire thyroid gland was submitted to pathology for review.  The neck was  irrigated with warm saline. Fibrillar was placed throughout the operative field. Strap muscles were approximated in  the midline with interrupted 3-0 Vicryl sutures. Platysma was closed with interrupted 3-0 Vicryl sutures. Skin was closed with a running 4-0 Monocryl subcuticular suture. Wound was washed and Dermabond was applied. The patient was awakened from anesthesia and brought to the recovery room. The patient tolerated the procedure well.   Armandina Gemma, [place] [facility] Office: [phone]

## 2021-12-21 NOTE — Transfer of Care (Signed)
Immediate Anesthesia Transfer of Care Note  Patient: Susan Nichols  Procedure(s) Performed: TOTAL THYROIDECTOMY  Patient Location: PACU  Anesthesia Type:General  Level of Consciousness: awake and patient cooperative  Airway & Oxygen Therapy: Patient Spontanous Breathing and Patient connected to face mask  Post-op Assessment: Report given to RN and Post -op Vital signs reviewed and stable  Post vital signs: Reviewed and stable  Last Vitals:  Vitals Value Taken Time  BP    Temp    Pulse 76 12/21/21 1123  Resp 17 12/21/21 1123  SpO2 100 % 12/21/21 1123  Vitals shown include unvalidated device data.  Last Pain:  Vitals:   12/21/21 0710  PainSc: 0-No pain         Complications: No notable events documented.

## 2021-12-21 NOTE — Anesthesia Procedure Notes (Signed)
Procedure Name: Intubation Date/Time: 12/21/2021 9:35 AM  Performed by: Claudia Desanctis, CRNAPre-anesthesia Checklist: Patient identified, Emergency Drugs available, Suction available and Patient being monitored Patient Re-evaluated:Patient Re-evaluated prior to induction Oxygen Delivery Method: Circle system utilized Preoxygenation: Pre-oxygenation with 100% oxygen Induction Type: IV induction Ventilation: Mask ventilation without difficulty Laryngoscope Size: Miller and 3 Grade View: Grade I Tube type: Oral Tube size: 7.0 mm Number of attempts: 1 Airway Equipment and Method: Stylet Placement Confirmation: ETT inserted through vocal cords under direct vision, positive ETCO2 and breath sounds checked- equal and bilateral Secured at: 22 cm Tube secured with: Tape Dental Injury: Teeth and Oropharynx as per pre-operative assessment

## 2021-12-21 NOTE — Interval H&P Note (Signed)
History and Physical Interval Note:  12/21/2021 9:07 AM  Susan Nichols  has presented today for surgery, with the diagnosis of [place].  The various methods of treatment have been discussed with the patient and family. After consideration of risks, benefits and other options for treatment, the patient has consented to    Procedure(s): TOTAL THYROIDECTOMY (N/A) as a surgical intervention.    The patient's history has been reviewed, patient examined, no change in status, stable for surgery.  I have reviewed the patient's chart and labs.  Questions were answered to the patient's satisfaction.    Armandina Gemma, [facility] [place] practice Office: [place]

## 2021-12-22 ENCOUNTER — Encounter (HOSPITAL_COMMUNITY): Payer: Self-pay | Admitting: Surgery

## 2021-12-22 DIAGNOSIS — I2510 Atherosclerotic heart disease of native coronary artery without angina pectoris: Secondary | ICD-10-CM | POA: Diagnosis not present

## 2021-12-22 DIAGNOSIS — Z6834 Body mass index (BMI) 34.0-34.9, adult: Secondary | ICD-10-CM | POA: Diagnosis not present

## 2021-12-22 DIAGNOSIS — R1310 Dysphagia, unspecified: Secondary | ICD-10-CM | POA: Diagnosis not present

## 2021-12-22 DIAGNOSIS — E669 Obesity, unspecified: Secondary | ICD-10-CM | POA: Diagnosis not present

## 2021-12-22 DIAGNOSIS — I10 Essential (primary) hypertension: Secondary | ICD-10-CM | POA: Diagnosis not present

## 2021-12-22 DIAGNOSIS — J398 Other specified diseases of upper respiratory tract: Secondary | ICD-10-CM | POA: Diagnosis not present

## 2021-12-22 DIAGNOSIS — E042 Nontoxic multinodular goiter: Secondary | ICD-10-CM | POA: Diagnosis not present

## 2021-12-22 DIAGNOSIS — Z8673 Personal history of transient ischemic attack (TIA), and cerebral infarction without residual deficits: Secondary | ICD-10-CM | POA: Diagnosis not present

## 2021-12-22 LAB — BASIC METABOLIC PANEL
Anion gap: 7 (ref 5–15)
BUN: 25 mg/dL — ABNORMAL HIGH (ref 8–23)
CO2: 25 mmol/L (ref 22–32)
Calcium: 9.9 mg/dL (ref 8.9–10.3)
Chloride: 105 mmol/L (ref 98–111)
Creatinine, Ser: 1.06 mg/dL — ABNORMAL HIGH (ref 0.44–1.00)
GFR, Estimated: 56 mL/min — ABNORMAL LOW (ref >60)
Glucose, Bld: 121 mg/dL — ABNORMAL HIGH (ref 70–99)
Potassium: 4.2 mmol/L (ref 3.5–5.1)
Sodium: 137 mmol/L (ref 135–145)

## 2021-12-22 MED ORDER — OXYCODONE HCL 5 MG PO TABS: 5.0000 mg | ORAL_TABLET | ORAL | 0 refills | Status: DC | PRN

## 2021-12-22 MED ORDER — LEVOTHYROXINE SODIUM 88 MCG PO TABS: 88.0000 ug | ORAL_TABLET | Freq: Every day | ORAL | 2 refills | Status: DC

## 2021-12-22 MED ORDER — OXYCODONE HCL 5 MG PO TABS: 5.0000 mg | ORAL_TABLET | Freq: Four times a day (QID) | ORAL | 0 refills | Status: DC | PRN

## 2021-12-22 NOTE — Discharge Summary (Signed)
Physician Discharge Summary   Patient ID: Susan Nichols MRN: [id number] DOB/AGE: 10/10/1950 71 y.o.  Admit date: 12/21/2021  Discharge date: 12/22/2021  Discharge Diagnoses:  Principal Problem:   Thyroid goiter Active Problems:   Tracheal deviation   Dysphagia   Multinodular goiter (nontoxic)   Discharged Condition: good  Hospital Course: Patient was admitted for observation following thyroid surgery.  Post op course was uncomplicated.  Pain was well controlled.  Tolerated diet.  Post op calcium level on morning following surgery was 9.9 mg/dl.  Patient was prepared for discharge home on POD#1.  Consults: None  Treatments: surgery: total thyroidectomy  Discharge Exam: Blood pressure (!) 147/56, pulse 66, temperature (!) 97.4 F (36.3 C), temperature source Oral, resp. rate 18, height '5\' 4"'$  (1.626 m), weight 90.3 kg, SpO2 97 %. HEENT - clear Neck - wound dry and intact; moderate soft tissue swelling increased overnight; voice near normal  Disposition: Home   Allergies as of 12/22/2021   No Known Allergies      Medication List     TAKE these medications    amLODipine 10 MG tablet Commonly known as: NORVASC Take 1 tablet (10 mg total) by mouth daily. What changed: when to take this   aspirin 81 MG tablet Take 1 tablet (81 mg total) by mouth daily.   atorvastatin 40 MG tablet Commonly known as: LIPITOR SMARTSIG:1 Tablet(s) By Mouth Every Evening   chlorthalidone 25 MG tablet Commonly known as: HYGROTON Take 1 tablet (25 mg total) by mouth daily.   cholecalciferol 25 MCG (1000 UNIT) tablet Commonly known as: VITAMIN D3 Take 1,000 Units by mouth every other day.   cloNIDine 0.2 MG tablet Commonly known as: CATAPRES Take 0.2 mg by mouth 2 (two) times daily.   diphenhydramine-acetaminophen 25-500 MG Tabs tablet Commonly known as: TYLENOL PM Take 1-2 tablets by mouth at bedtime as needed (sleep).   FISH OIL PO Take 3 capsules by mouth daily. When  able to remember   hydrALAZINE 50 MG tablet Commonly known as: APRESOLINE Take 1 tablet (50 mg total) by mouth 2 (two) times daily. What changed: how much to take   hydrOXYzine 25 MG tablet Commonly known as: ATARAX Take 25 mg by mouth as needed for itching.   isosorbide mononitrate 60 MG 24 hr tablet Commonly known as: IMDUR Take 1 tablet (60 mg total) by mouth daily. What changed:  how much to take when to take this   levothyroxine 88 MCG tablet Commonly known as: Synthroid Take 1 tablet (88 mcg total) by mouth daily before breakfast.   lisinopril 20 MG tablet Commonly known as: ZESTRIL Take 20 mg by mouth daily.   Magnesium 400 MG Tabs Take 400 mg by mouth daily. When able to remember   metoprolol tartrate 50 MG tablet Commonly known as: LOPRESSOR Take 1 tablet (50 mg total) by mouth 2 (two) times daily.   oxyCODONE 5 MG immediate release tablet Commonly known as: Oxy IR/ROXICODONE Take 1 tablet (5 mg total) by mouth every 6 (six) hours as needed for moderate pain. What changed: reasons to take this   oxyCODONE 5 MG immediate release tablet Commonly known as: Oxy IR/ROXICODONE Take 1-2 tablets (5-10 mg total) by mouth every 4 (four) hours as needed for moderate pain. What changed: You were already taking a medication with the same name, and this prescription was added. Make sure you understand how and when to take each.   potassium chloride 10 MEQ tablet Commonly known as: KLOR-CON  Take 10 mEq by mouth daily.   Tylenol 8 Hour 650 MG CR tablet Generic drug: acetaminophen Take 650 mg by mouth 2 (two) times daily as needed for pain.        Follow-up Information     Armandina Gemma, MD. Schedule an appointment as soon as possible for a visit in 3 week(s).   Specialty: General Surgery Why: For wound re-check Contact information: [place] [address] [phone]                 Vasil Juhasz, [facility] Office: [phone]   Signed: Armandina Gemma 12/22/2021, 9:21 AM

## 2021-12-22 NOTE — Progress Notes (Signed)
Transition of Care [facility]) Screening Note  Patient Details  Name: Susan Nichols Date of Birth: 04/05/1951  [facility]) CM/SW Contact:    Sherie Don, LCSW Phone Number: 12/22/2021, 10:31 AM  Transition of Care Department [facility]) has reviewed patient and no TOC needs have been identified at this time. We will continue to monitor patient advancement through interdisciplinary progression rounds. If new patient transition needs arise, please place a TOC consult.

## 2021-12-22 NOTE — Discharge Instructions (Signed)
[facility] - Dr. Scotland Dost  THYROID & PARATHYROID SURGERY:  POST-OP INSTRUCTIONS  Always review the instruction sheet provided by the hospital nurse at discharge.  A prescription for pain medication may be sent to your pharmacy at the time of discharge.  Take your pain medication as prescribed.  If narcotic pain medicine is not needed, then you may take acetaminophen (Tylenol) or ibuprofen (Advil) as needed for pain or soreness.  Take your normal home medications as prescribed unless otherwise directed.  If you need a refill on your pain medication, please contact the office during regular business hours.  Prescriptions will not be processed by the office after 5:00PM or on weekends.  Start with a light diet upon arrival home, such as soup and crackers or toast.  Be sure to drink plenty of fluids.  Resume your normal diet the day after surgery.  Most patients will experience some swelling and bruising on the chest and neck area.  Ice packs will help for the first 48 hours after arriving home.  Swelling and bruising will take several days to resolve.   It is common to experience some constipation after surgery.  Increasing fluid intake and taking a stool softener (Colace) will usually help to prevent this problem.  A mild laxative (Milk of Magnesia or Miralax) should be taken according to package directions if there has been no bowel movement after 48 hours.  Dermabond glue covers your incision. This seals the wound and you may shower at any time. The Dermabond will remain in place for about a week.  You may gradually remove the glue when it loosens around the edges.  If you need to loosen the Dermabond for removal, apply a layer of Vaseline to the wound for 15 minutes and then remove with a Kleenex. Your sutures are under the skin and will not show - they will dissolve on their own.  You may resume light daily activities beginning the day after discharge (such as self-care,  walking, climbing stairs), gradually increasing activities as tolerated. You may have sexual intercourse when it is comfortable. Refrain from any heavy lifting or straining until approved by your doctor. You may drive when you no longer are taking prescription pain medication, you can comfortably wear a seatbelt, and you can safely maneuver your car and apply the brakes.  You will see your doctor in the office for a follow-up appointment approximately three weeks after your surgery.  Make sure that you call for this appointment within a day or two after you arrive home to insure a convenient appointment time. Please have any requested laboratory tests performed a few days prior to your office visit so that the results will be available at your follow up appointment.  WHEN TO CALL THE [facility] OFFICE: -- Fever greater than 101.5 -- Inability to urinate -- Nausea and/or vomiting - persistent -- Extreme swelling or bruising -- Continued bleeding from incision -- Increased pain, redness, or drainage from the incision -- Difficulty swallowing or breathing -- Muscle cramping or spasms -- Numbness or tingling in hands or around lips  The clinic staff is available to answer your questions during regular business hours.  Please don't hesitate to call and ask to speak to one of the nurses if you have concerns.  [facility] OFFICE: [phone] (24 hours)  Please sign up for MyChart accounts. This will allow you to communicate directly with my nurse or myself without having to call the office. It will also allow you   to view your test results. You will need to enroll in MyChart for my office ([facility]) and for the hospital ([facility]).  Hosteen Kienast, MD [facility] practice 

## 2021-12-22 NOTE — Anesthesia Postprocedure Evaluation (Signed)
Anesthesia Post Note  Patient: Susan Nichols  Procedure(s) Performed: TOTAL THYROIDECTOMY     Patient location during evaluation: PACU Anesthesia Type: General Level of consciousness: awake and alert Pain management: pain level controlled Vital Signs Assessment: post-procedure vital signs reviewed and stable Respiratory status: spontaneous breathing, nonlabored ventilation and respiratory function stable Cardiovascular status: blood pressure returned to baseline and stable Postop Assessment: no apparent nausea or vomiting Anesthetic complications: no   No notable events documented.  Last Vitals:  Vitals:   12/22/21 0137 12/22/21 0626  BP: (!) 129/49 (!) 134/57  Pulse: 72 68  Resp: 18 18  Temp: (!) 36.3 C 36.4 C  SpO2: 95% 92%    Last Pain:  Vitals:   12/22/21 0626  TempSrc: Oral  PainSc:                  [name]

## 2021-12-22 NOTE — Progress Notes (Signed)
Discharge instructions given to patient and all questions were answered.  

## 2021-12-23 LAB — SURGICAL PATHOLOGY

## 2021-12-23 NOTE — Progress Notes (Signed)
Good news!  Path benign as expected.  Will have written report for patient at office visit.  tmg  Armandina Gemma, [facility] A [place] practice Office: [phone]

## 2022-01-26 DIAGNOSIS — E890 Postprocedural hypothyroidism: Secondary | ICD-10-CM | POA: Diagnosis not present

## 2022-02-09 DIAGNOSIS — R072 Precordial pain: Secondary | ICD-10-CM | POA: Diagnosis not present

## 2022-02-09 DIAGNOSIS — I10 Essential (primary) hypertension: Secondary | ICD-10-CM | POA: Diagnosis not present

## 2022-02-09 DIAGNOSIS — I340 Nonrheumatic mitral (valve) insufficiency: Secondary | ICD-10-CM | POA: Diagnosis not present

## 2022-02-10 DIAGNOSIS — Z79899 Other long term (current) drug therapy: Secondary | ICD-10-CM | POA: Diagnosis not present

## 2022-02-10 DIAGNOSIS — E7849 Other hyperlipidemia: Secondary | ICD-10-CM | POA: Diagnosis not present

## 2022-02-10 DIAGNOSIS — E876 Hypokalemia: Secondary | ICD-10-CM | POA: Diagnosis not present

## 2022-02-10 DIAGNOSIS — E034 Atrophy of thyroid (acquired): Secondary | ICD-10-CM | POA: Diagnosis not present

## 2022-02-10 DIAGNOSIS — D649 Anemia, unspecified: Secondary | ICD-10-CM | POA: Diagnosis not present

## 2022-02-23 DIAGNOSIS — Z9089 Acquired absence of other organs: Secondary | ICD-10-CM | POA: Diagnosis not present

## 2022-02-23 DIAGNOSIS — R600 Localized edema: Secondary | ICD-10-CM | POA: Diagnosis not present

## 2022-02-23 DIAGNOSIS — E785 Hyperlipidemia, unspecified: Secondary | ICD-10-CM | POA: Diagnosis not present

## 2022-02-23 DIAGNOSIS — R7309 Other abnormal glucose: Secondary | ICD-10-CM | POA: Diagnosis not present

## 2022-02-23 DIAGNOSIS — I10 Essential (primary) hypertension: Secondary | ICD-10-CM | POA: Diagnosis not present

## 2022-04-06 DIAGNOSIS — I10 Essential (primary) hypertension: Secondary | ICD-10-CM | POA: Diagnosis not present

## 2022-04-06 DIAGNOSIS — R000 Tachycardia, unspecified: Secondary | ICD-10-CM | POA: Diagnosis not present

## 2022-04-06 DIAGNOSIS — R600 Localized edema: Secondary | ICD-10-CM | POA: Diagnosis not present

## 2022-04-06 DIAGNOSIS — R009 Unspecified abnormalities of heart beat: Secondary | ICD-10-CM | POA: Diagnosis not present

## 2022-04-06 DIAGNOSIS — R635 Abnormal weight gain: Secondary | ICD-10-CM | POA: Diagnosis not present

## 2022-04-06 DIAGNOSIS — E785 Hyperlipidemia, unspecified: Secondary | ICD-10-CM | POA: Diagnosis not present

## 2022-04-06 DIAGNOSIS — Z0000 Encounter for general adult medical examination without abnormal findings: Secondary | ICD-10-CM | POA: Diagnosis not present

## 2022-04-06 DIAGNOSIS — E119 Type 2 diabetes mellitus without complications: Secondary | ICD-10-CM | POA: Diagnosis not present

## 2022-04-06 DIAGNOSIS — G464 Cerebellar stroke syndrome: Secondary | ICD-10-CM | POA: Diagnosis not present

## 2022-04-20 DIAGNOSIS — R072 Precordial pain: Secondary | ICD-10-CM | POA: Diagnosis not present

## 2022-04-20 DIAGNOSIS — I10 Essential (primary) hypertension: Secondary | ICD-10-CM | POA: Diagnosis not present

## 2022-04-20 DIAGNOSIS — I340 Nonrheumatic mitral (valve) insufficiency: Secondary | ICD-10-CM | POA: Diagnosis not present

## 2022-04-27 DIAGNOSIS — I10 Essential (primary) hypertension: Secondary | ICD-10-CM | POA: Diagnosis not present

## 2022-04-27 DIAGNOSIS — E785 Hyperlipidemia, unspecified: Secondary | ICD-10-CM | POA: Diagnosis not present

## 2022-04-27 DIAGNOSIS — R600 Localized edema: Secondary | ICD-10-CM | POA: Diagnosis not present

## 2022-04-27 DIAGNOSIS — E890 Postprocedural hypothyroidism: Secondary | ICD-10-CM | POA: Diagnosis not present

## 2022-05-19 DIAGNOSIS — Z1212 Encounter for screening for malignant neoplasm of rectum: Secondary | ICD-10-CM | POA: Diagnosis not present

## 2022-05-19 DIAGNOSIS — Z1211 Encounter for screening for malignant neoplasm of colon: Secondary | ICD-10-CM | POA: Diagnosis not present

## 2022-06-02 LAB — COLOGUARD: COLOGUARD: NEGATIVE

## 2022-06-08 DIAGNOSIS — E785 Hyperlipidemia, unspecified: Secondary | ICD-10-CM | POA: Diagnosis not present

## 2022-06-08 DIAGNOSIS — I10 Essential (primary) hypertension: Secondary | ICD-10-CM | POA: Diagnosis not present

## 2022-06-08 DIAGNOSIS — G464 Cerebellar stroke syndrome: Secondary | ICD-10-CM | POA: Diagnosis not present

## 2022-06-08 DIAGNOSIS — E038 Other specified hypothyroidism: Secondary | ICD-10-CM | POA: Diagnosis not present

## 2022-07-07 DIAGNOSIS — I340 Nonrheumatic mitral (valve) insufficiency: Secondary | ICD-10-CM | POA: Diagnosis not present

## 2022-07-07 DIAGNOSIS — I10 Essential (primary) hypertension: Secondary | ICD-10-CM | POA: Diagnosis not present

## 2022-07-07 DIAGNOSIS — R072 Precordial pain: Secondary | ICD-10-CM | POA: Diagnosis not present

## 2022-08-05 DIAGNOSIS — I10 Essential (primary) hypertension: Secondary | ICD-10-CM | POA: Diagnosis not present

## 2022-08-05 DIAGNOSIS — E890 Postprocedural hypothyroidism: Secondary | ICD-10-CM | POA: Diagnosis not present

## 2022-08-05 DIAGNOSIS — R600 Localized edema: Secondary | ICD-10-CM | POA: Diagnosis not present

## 2022-10-05 DIAGNOSIS — I340 Nonrheumatic mitral (valve) insufficiency: Secondary | ICD-10-CM | POA: Diagnosis not present

## 2022-10-05 DIAGNOSIS — R072 Precordial pain: Secondary | ICD-10-CM | POA: Diagnosis not present

## 2022-10-05 DIAGNOSIS — I10 Essential (primary) hypertension: Secondary | ICD-10-CM | POA: Diagnosis not present

## 2023-01-05 DIAGNOSIS — I340 Nonrheumatic mitral (valve) insufficiency: Secondary | ICD-10-CM | POA: Diagnosis not present

## 2023-01-05 DIAGNOSIS — I10 Essential (primary) hypertension: Secondary | ICD-10-CM | POA: Diagnosis not present

## 2023-01-05 DIAGNOSIS — R072 Precordial pain: Secondary | ICD-10-CM | POA: Diagnosis not present

## 2023-04-06 DIAGNOSIS — R072 Precordial pain: Secondary | ICD-10-CM | POA: Diagnosis not present

## 2023-04-06 DIAGNOSIS — I10 Essential (primary) hypertension: Secondary | ICD-10-CM | POA: Diagnosis not present

## 2023-04-06 DIAGNOSIS — M65322 Trigger finger, left index finger: Secondary | ICD-10-CM | POA: Diagnosis not present

## 2023-04-11 DIAGNOSIS — Z79899 Other long term (current) drug therapy: Secondary | ICD-10-CM | POA: Diagnosis not present

## 2023-04-11 DIAGNOSIS — E1165 Type 2 diabetes mellitus with hyperglycemia: Secondary | ICD-10-CM | POA: Diagnosis not present

## 2023-04-11 DIAGNOSIS — E876 Hypokalemia: Secondary | ICD-10-CM | POA: Diagnosis not present

## 2023-04-11 DIAGNOSIS — E7849 Other hyperlipidemia: Secondary | ICD-10-CM | POA: Diagnosis not present

## 2023-04-11 DIAGNOSIS — D649 Anemia, unspecified: Secondary | ICD-10-CM | POA: Diagnosis not present

## 2023-04-13 DIAGNOSIS — R072 Precordial pain: Secondary | ICD-10-CM | POA: Diagnosis not present

## 2023-04-13 DIAGNOSIS — I10 Essential (primary) hypertension: Secondary | ICD-10-CM | POA: Diagnosis not present

## 2023-04-13 DIAGNOSIS — R000 Tachycardia, unspecified: Secondary | ICD-10-CM | POA: Diagnosis not present

## 2023-05-18 DIAGNOSIS — R000 Tachycardia, unspecified: Secondary | ICD-10-CM | POA: Diagnosis not present

## 2023-05-18 DIAGNOSIS — R072 Precordial pain: Secondary | ICD-10-CM | POA: Diagnosis not present

## 2023-05-18 DIAGNOSIS — I10 Essential (primary) hypertension: Secondary | ICD-10-CM | POA: Diagnosis not present

## 2023-08-17 DIAGNOSIS — I10 Essential (primary) hypertension: Secondary | ICD-10-CM | POA: Diagnosis not present

## 2023-08-17 DIAGNOSIS — R072 Precordial pain: Secondary | ICD-10-CM | POA: Diagnosis not present

## 2023-08-17 DIAGNOSIS — R0602 Shortness of breath: Secondary | ICD-10-CM | POA: Diagnosis not present

## 2023-10-11 DIAGNOSIS — Z1231 Encounter for screening mammogram for malignant neoplasm of breast: Secondary | ICD-10-CM | POA: Diagnosis not present

## 2023-10-21 DIAGNOSIS — R92321 Mammographic fibroglandular density, right breast: Secondary | ICD-10-CM | POA: Diagnosis not present

## 2023-10-21 DIAGNOSIS — N6315 Unspecified lump in the right breast, overlapping quadrants: Secondary | ICD-10-CM | POA: Diagnosis not present

## 2023-10-26 DIAGNOSIS — N6312 Unspecified lump in the right breast, upper inner quadrant: Secondary | ICD-10-CM | POA: Diagnosis not present

## 2023-10-26 DIAGNOSIS — N62 Hypertrophy of breast: Secondary | ICD-10-CM | POA: Diagnosis not present

## 2023-11-16 DIAGNOSIS — R0602 Shortness of breath: Secondary | ICD-10-CM | POA: Diagnosis not present

## 2023-11-16 DIAGNOSIS — R072 Precordial pain: Secondary | ICD-10-CM | POA: Diagnosis not present

## 2023-11-16 DIAGNOSIS — I10 Essential (primary) hypertension: Secondary | ICD-10-CM | POA: Diagnosis not present

## 2023-12-01 IMAGING — CT CT ANGIO CHEST
2 of 4 series · 17 of 32 positions shown · IV contrast (agent unspecified)
Comparison: January 04, 2006

CLINICAL DATA: Left neck mass for the past 8 months with tracheal
deviation, dysphagia with food getting stuck. History of stroke.

EXAM:
CT ANGIOGRAPHY CHEST WITH CONTRAST
TECHNIQUE: Multidetector CT imaging of the chest was performed using the
standard protocol during bolus administration of intravenous
contrast. Multiplanar CT image reconstructions and MIPs were
obtained to evaluate the vascular anatomy.

[Series 21: thins · axial · 0.83mm/px · z∈[+27,+256]mm · 9 of 369 slices shown]
[im 41/369  lung]
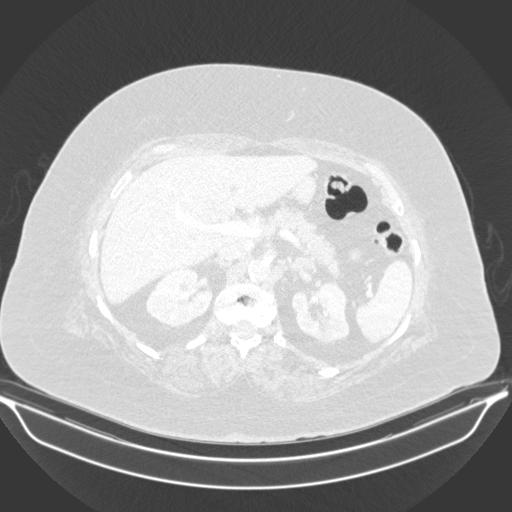
[im 82/369  mediastinal]
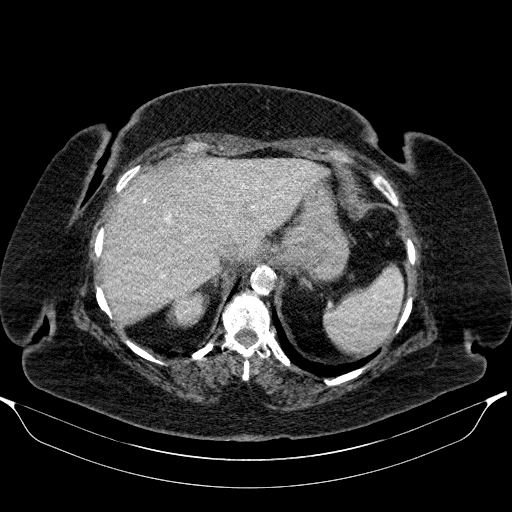
[im 123/369  lung]
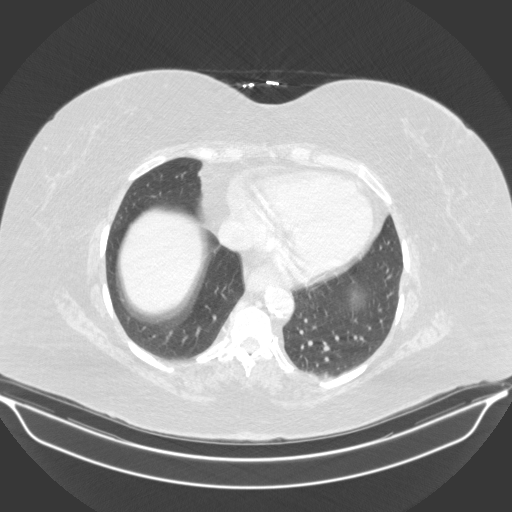
[im 164/369  mediastinal]
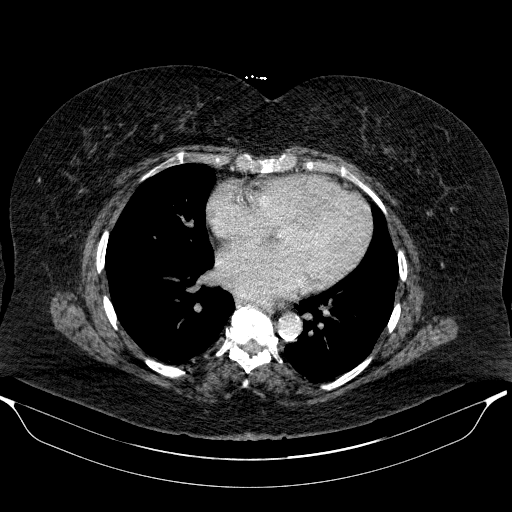
[im 171/369  lung]
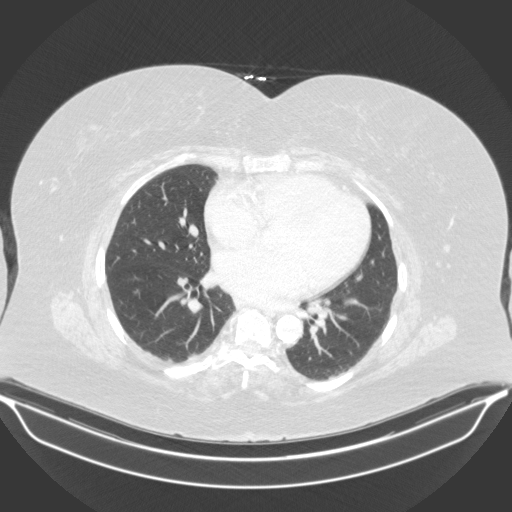
[im 205/369  mediastinal]
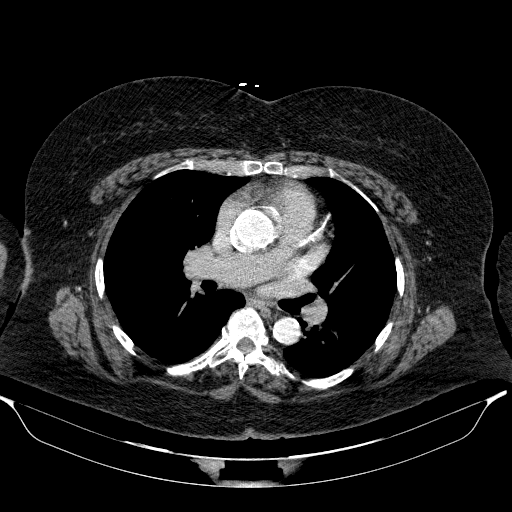
[im 246/369  lung]
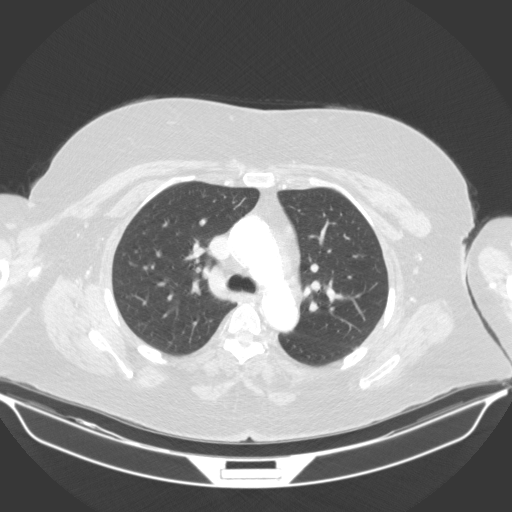
[im 287/369  mediastinal]
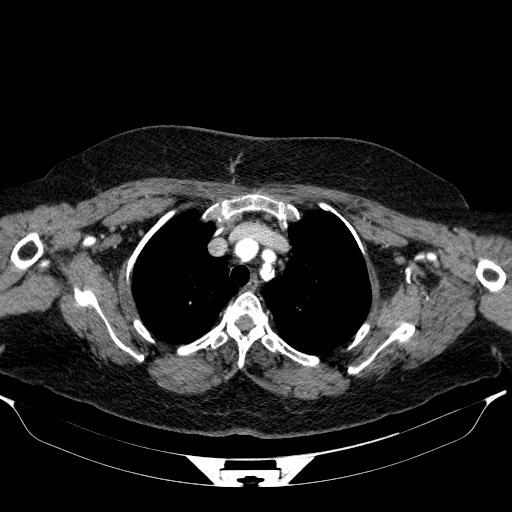
[im 328/369  lung]
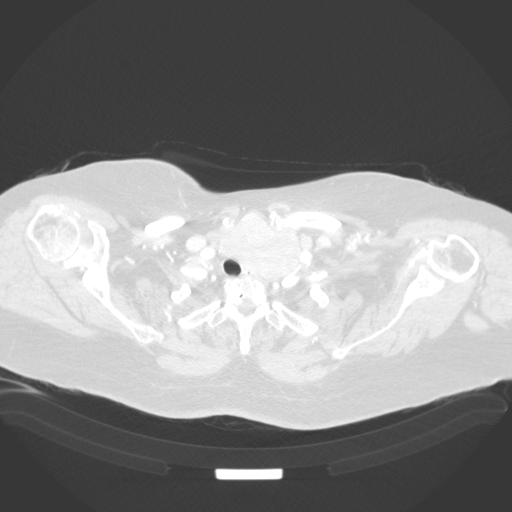

[Series 22: super d · axial · 0.83mm/px · z∈[+27,+256]mm · 8 of 369 slices shown]
[im 41/369  lung]
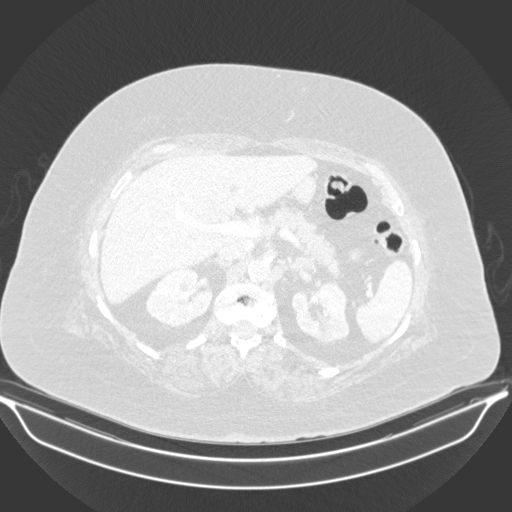
[im 82/369  lung]
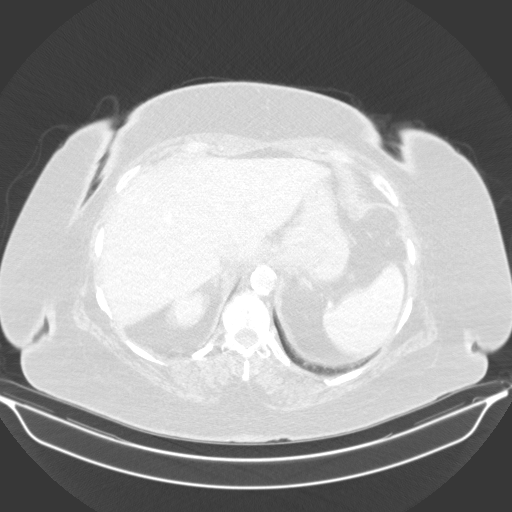
[im 123/369  lung]
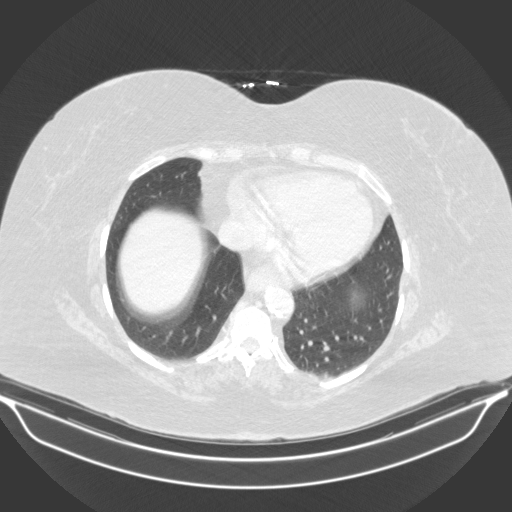
[im 164/369  lung]
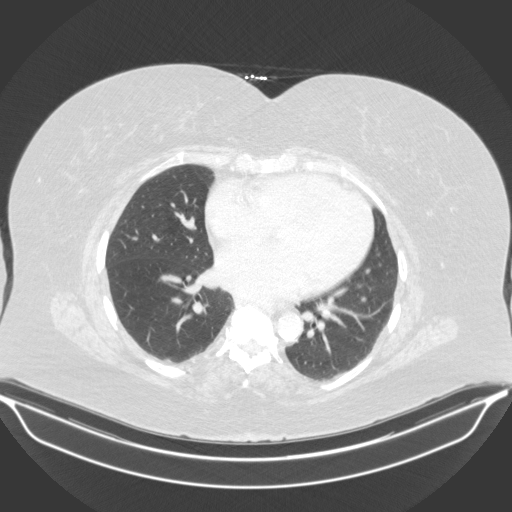
[im 205/369  lung]
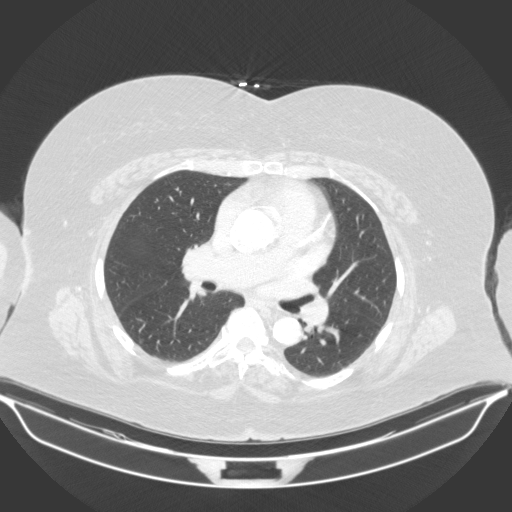
[im 246/369  lung]
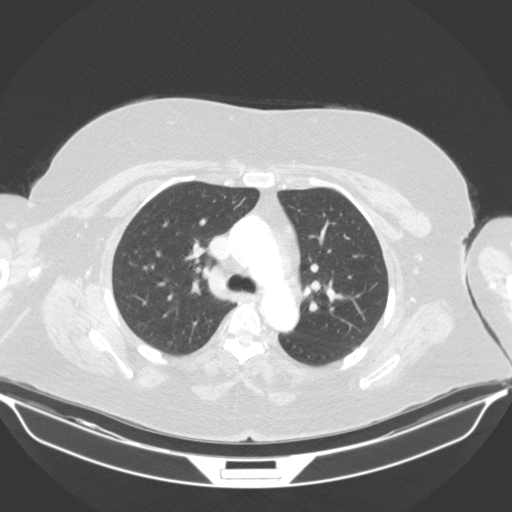
[im 287/369  lung]
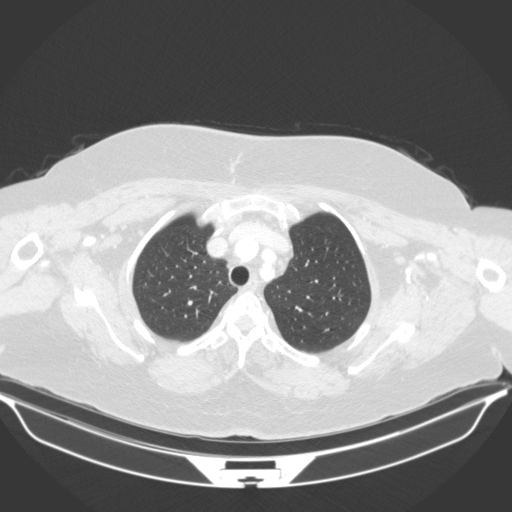
[im 328/369  lung]
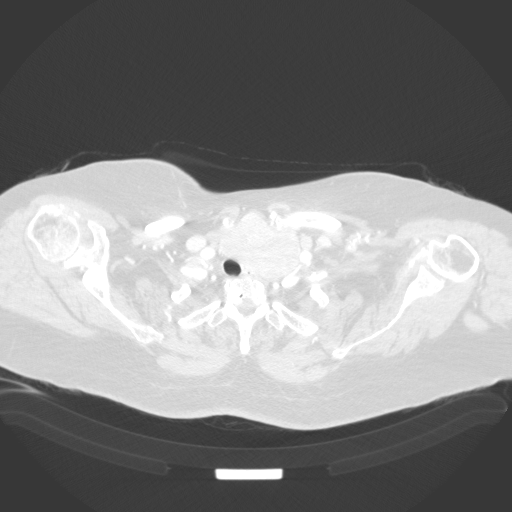

[17 of 32 positions shown; findings below may reference images not displayed]

RADIATION DOSE REDUCTION: This exam was performed according to the
departmental dose-optimization program which includes automated
exposure control, adjustment of the mA and/or kV according to
patient size and/or use of iterative reconstruction technique.

CONTRAST:  60mL R0NXTF-H50 IOPAMIDOL (R0NXTF-H50) INJECTION 76%
FINDINGS: Study is slightly suboptimal due to likely respiratory motion
artifacts.

Cardiovascular: Heart is borderline. No pericardial effusion. Severe
atheromatous calcifications of the thoracic aorta. Coronary artery
calcifications. Main pulmonary artery measures 3.3 cm and is mildly
dilated.

Mediastinum/Nodes: No mediastinal mass or significant
lymphadenopathy. Heterogeneous enlargement of the left thyroid lobe.
The conglomerated thyroid nodules measure 6.5 cm AP x 6.9 cm
transverse in comparison to [DATE] x 3.8 cm diffuse enlargement of the
left thyroid lobe. The craniocaudad measurements cannot be made as
the upper aspect of the thyroid is not included in the study.
Thyroid extends into the superior mediastinum. There are some coarse
calcifications seen within the thyroid mass. There is rightward
deviation of the trachea seen. There is apparent compression of the
esophagus seen. Small hiatal hernia.

Lungs/Pleura: Lungs are clear. No pleural effusion or pneumothorax.

Upper Abdomen: Biliary sludge in the dependent gallbladder without
cholecystitis. Mild steatosis. Spleen, pancreas, adrenals and
kidneys have a normal appearance. Small hiatal hernia.

Musculoskeletal: Mild thoracic spondylosis

Review of the MIP images confirms the above findings.
IMPRESSION: Multinodular left thyroid mass and has significantly increased in
the interim. Rightward deviation of the trachea. Esophagus at the
region of the thyroid mass appears to be compressed. Recommend
ultrasound examination and ultrasound guided biopsy of the left
thyroid mass.

Severe atheromatous calcifications of the thoracic aorta. Mild
dilation of the main pulmonary artery.

## 2023-12-01 IMAGING — CT CT NECK WO/W CM
1 of 8 series · 2 of 14 positions shown, 3 images · IV contrast (APPLIED)
Comparison: 11/25/2027 CTA head neck, correlation is made with
05/25/2021 thyroid ultrasound

CLINICAL DATA: Left neck mass

EXAM:
CT NECK WITH AND WITHOUT CONTRAST
TECHNIQUE: Multidetector CT imaging of the neck was performed without and with
intravenous contrast.

[Series 3: neck · axial · 0.49mm/px · z∈[+173,+481]mm · 2 of 155 slices shown, 3 images]
[im 1/155  soft-tissue]
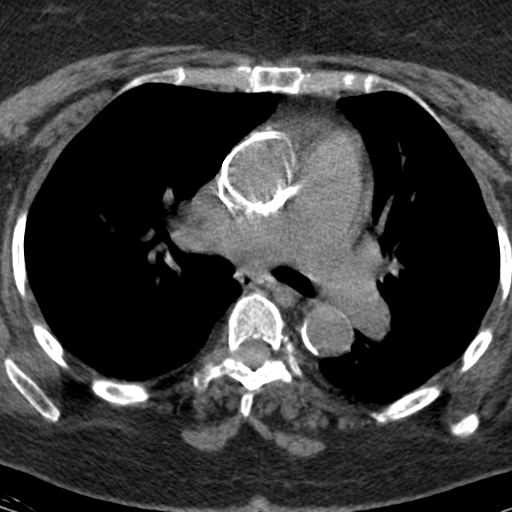
[im 1/155  bone]
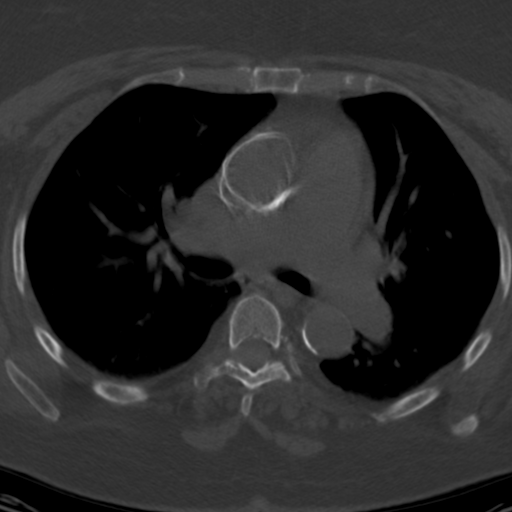
[im 155/155  bone]
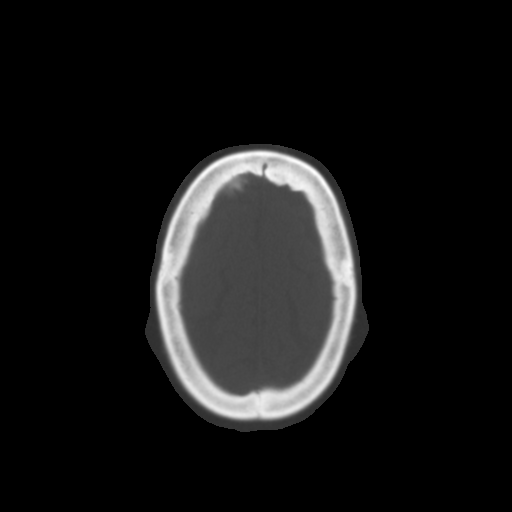

[2 of 14 positions shown; findings below may reference images not displayed]

RADIATION DOSE REDUCTION: This exam was performed according to the
departmental dose-optimization program which includes automated
exposure control, adjustment of the mA and/or kV according to
patient size and/or use of iterative reconstruction technique.

CONTRAST:  60mL O711KK-0DW IOPAMIDOL (O711KK-0DW) INJECTION 76%
FINDINGS: Pharynx and larynx: Normal. No mass or swelling. The larynx and
trachea are deviated to the right, secondary to the thyroid mass,
described below. Compression of the esophagus.

Salivary glands: No inflammation, mass, or stone. The left
submandibular gland is somewhat anteriorly displaced by the superior
margin the left thyroid.

Thyroid: Redemonstrated multinodular goiter, with the left lobe now
measuring up to 7.3 x 6.6 x 10.5 cm (AP x TR x CC) (series 16, image
92 and series 27, image 55), previously 4.5 x 5.4 x 7.5 cm when
remeasured similarly. The isthmus is enlarged, measuring up to
cm in craniocaudal dimension (series 27, image 48), previously
cm. The right thyroid lobe measures up to 2.3 x 3.2 x 5.8 cm (AP x
TR x CC) (series 16, image 103 and series 27, image 52), previously
2.9 x 2.7 x 6.1 cm.

Lymph nodes: None enlarged or abnormal density.

Vascular: Patent. The left internal jugular vein is laterally
displaced by the thyroid, but remains patent. Aortic atherosclerotic
calcifications, which extend into the branch vessels.

Limited intracranial: Negative.

Visualized orbits: Negative.

Mastoids and visualized paranasal sinuses: Clear.

Skeleton: No acute osseous abnormality.

Upper chest: Focal pulmonary opacity.  No pleural effusion.

Other: None.
IMPRESSION: 1. Multinodular goiter, with significant increase in the size of the
left thyroid lobe and isthmus compared to the 4921 CT, but likely
similar to the 05/25/2021 ultrasound. This causes rightward
deviation of the larynx and trachea, as well as compression of the
esophagus.
2. No other acute finding in the neck.

## 2023-12-20 DIAGNOSIS — R072 Precordial pain: Secondary | ICD-10-CM | POA: Diagnosis not present

## 2023-12-20 DIAGNOSIS — R0602 Shortness of breath: Secondary | ICD-10-CM | POA: Diagnosis not present

## 2023-12-20 DIAGNOSIS — I10 Essential (primary) hypertension: Secondary | ICD-10-CM | POA: Diagnosis not present

## 2024-01-17 ENCOUNTER — Ambulatory Visit (INDEPENDENT_AMBULATORY_CARE_PROVIDER_SITE_OTHER): Admitting: Nurse Practitioner

## 2024-01-17 ENCOUNTER — Encounter: Payer: Self-pay | Admitting: Nurse Practitioner

## 2024-01-17 VITALS — BP 140/48 | HR 80 | Ht 64.0 in | Wt 232.0 lb

## 2024-01-17 DIAGNOSIS — E039 Hypothyroidism, unspecified: Secondary | ICD-10-CM | POA: Diagnosis not present

## 2024-01-17 DIAGNOSIS — Z8673 Personal history of transient ischemic attack (TIA), and cerebral infarction without residual deficits: Secondary | ICD-10-CM

## 2024-01-17 DIAGNOSIS — M1990 Unspecified osteoarthritis, unspecified site: Secondary | ICD-10-CM | POA: Insufficient documentation

## 2024-01-17 DIAGNOSIS — Z0000 Encounter for general adult medical examination without abnormal findings: Secondary | ICD-10-CM | POA: Diagnosis not present

## 2024-01-17 DIAGNOSIS — M1710 Unilateral primary osteoarthritis, unspecified knee: Secondary | ICD-10-CM | POA: Diagnosis not present

## 2024-01-17 DIAGNOSIS — R600 Localized edema: Secondary | ICD-10-CM | POA: Insufficient documentation

## 2024-01-17 DIAGNOSIS — I10 Essential (primary) hypertension: Secondary | ICD-10-CM | POA: Diagnosis not present

## 2024-01-17 DIAGNOSIS — E785 Hyperlipidemia, unspecified: Secondary | ICD-10-CM

## 2024-01-17 NOTE — Assessment & Plan Note (Signed)
 Lower extremity edema and shortness of breath Reports lower extremity edema and shortness of breath. - Use compression stockings. - Elevate legs when sitting. - Monitor salt intake. - Encourage use of leg cycling device for activity.

## 2024-01-17 NOTE — Patient Instructions (Signed)
 For the swelling in your lower extremities, be sure to elevate your legs when able, mind the salt intake, stay physically active and consider wearing compression stockings.    It is important that you exercise regularly at least 30 minutes 5 times a week as tolerated  Think about what you will eat, plan ahead. Choose " clean, green, fresh or frozen" over canned, processed or packaged foods which are more sugary, salty and fatty. 70 to 75% of food eaten should be vegetables and fruit. Three meals at set times with snacks allowed between meals, but they must be fruit or vegetables. Aim to eat over a 12 hour period , example 7 am to 7 pm, and STOP after  your last meal of the day. Drink water ,generally about 64 ounces per day, no other drink is as healthy. Fruit juice is best enjoyed in a healthy way, by EATING the fruit.  Thanks for choosing [facility] we consider it a privelige to serve you.

## 2024-01-17 NOTE — Progress Notes (Signed)
 New Patient Office Visit  Subjective:  Patient ID: Susan Nichols, female    DOB: 01/31/1951  Age: 73 y.o. MRN: [id number]  CC:  Chief Complaint  Patient presents with   Establish Care    HPI     Discussed the use of AI scribe software for clinical note transcription with the patient, who gave verbal consent to proceed.  History of Present Illness [name] is a 73 year old female  has a past medical history of Anemia, Coronary artery disease, Dyspnea, Hyperlipemia, Hypertension, Lower extremity edema, Multinodular goiter, and Stroke (HCC). Patient presents to establish care for her chronic medical conditions . Previous PCP Dr Benjamine  She is on multiple antihypertensive medications, including amlodipine  10 mg daily, clonidine  0.2 mg twice daily, hydralazine  50 mg twice daily, isosorbide  mononitrate 60 mg daily, metoprolol  50 mg twice daily, and chlorthalidone  25 mg daily. She was previously on lisinopril  but discontinued it.  Followed by cardiology Dr. Claudene   Has history includes a stroke in 2019.,for cholesterol management, she takes atorvastatin  40 mg daily also on aspirin  81 mg in the morning.   She uses Tylenol  PM at bedtime as needed and regular Tylenol  during the day if necessary for arthritis  She experiences shortness of breath, particularly when rushing or visiting the doctor, and has leg swelling, which she attributes to her medication. She has compression socks but was not wearing them at the time of the visit. She uses a cane for mobility and has a history of knee replacement surgery. Her physical activity is limited due to difficulty walking, but she uses a leg cycle for exercise.   She had a normal colon cancer screening with Cologuard last year and a mammogram in June of this year, followed by a biopsy that did not find any concerning spots. She believes she received a pneumococcal vaccine last year at [facility].  On levothyroxine  112 mcg daily for thyroid   management due for labs  She lives with her husband, does not smoke, drink, or use drugs, and has one living daughter. Her son is deceased.  She is accompanied by her daughter   Assessment & Plan    Past Medical History:  Diagnosis Date   Anemia    Coronary artery disease    Dyspnea    Hyperlipemia    Hypertension    Lower extremity edema    Multinodular goiter    causing tracheal deviation   Stroke [facility])     Past Surgical History:  Procedure Laterality Date   CHOLECYSTECTOMY N/A 10/08/2021   Procedure: LAPAROSCOPIC CHOLECYSTECTOMY WITH INTRAOPERATIVE CHOLANGIOGRAM;  Surgeon: Vernetta Berg, MD;  Location: [facility];  Service: General;  Laterality: N/A;   THYROIDECTOMY N/A 12/21/2021   Procedure: TOTAL THYROIDECTOMY;  Surgeon: Eletha Boas, MD;  Location: [facility];  Service: General;  Laterality: N/A;   TOTAL KNEE ARTHROPLASTY  05/10/2005   right knee   TUBAL LIGATION      Family History  Problem Relation Age of Onset   Stroke Mother    Hypertension Mother    Hyperlipidemia Mother    Hypertension Father    Hyperlipidemia Father    Stroke Brother     Social History   Socioeconomic History   Marital status: Married    Spouse name: Not on file   Number of children: 1   Years of education: Not on file   Highest education level: Not on file  Occupational History   Not on file  Tobacco Use  Smoking status: Never   Smokeless tobacco: Never  Vaping Use   Vaping status: Never Used  Substance and Sexual Activity   Alcohol use: No   Drug use: No   Sexual activity: Not on file  Other Topics Concern   Not on file  Social History Narrative   Lives at home with her husband.   Daughter is a Arboriculturist: Not on [employer] Insecurity: Not on file  Transportation Needs: Not on file  Physical Activity: Not on file  Stress: Not on file  Social Connections: Not on file  Intimate Partner Violence: Not on file     ROS Review of Systems  Constitutional:  Negative for appetite change, chills, fatigue and fever.  HENT:  Negative for congestion, postnasal drip, rhinorrhea and sneezing.   Respiratory:  Positive for shortness of breath. Negative for cough and wheezing.   Cardiovascular:  Positive for leg swelling. Negative for chest pain and palpitations.  Gastrointestinal:  Negative for abdominal pain, constipation, nausea and vomiting.  Genitourinary:  Negative for difficulty urinating, dysuria, flank pain and frequency.  Musculoskeletal:  Positive for arthralgias. Negative for joint swelling and myalgias.  Skin:  Negative for color change, pallor, rash and wound.  Neurological:  Negative for facial asymmetry, weakness, numbness and headaches.  Psychiatric/Behavioral:  Negative for behavioral problems, confusion, self-injury and suicidal ideas.     Objective:   Today's Vitals: BP (!) 140/48   Pulse 80   Ht 5' 4 (1.626 m)   Wt 232 lb (105.2 kg)   SpO2 100%   BMI 39.82 kg/m   Physical Exam Vitals and nursing note reviewed.  Constitutional:      General: She is not in acute distress.    Appearance: Normal appearance. She is obese. She is not ill-appearing, toxic-appearing or diaphoretic.  Eyes:     General: No scleral icterus.       Right eye: No discharge.        Left eye: No discharge.     Extraocular Movements: Extraocular movements intact.     Conjunctiva/sclera: Conjunctivae normal.  Cardiovascular:     Rate and Rhythm: Normal rate and regular rhythm.     Pulses: Normal pulses.     Heart sounds: Normal heart sounds. No murmur heard.    No friction rub. No gallop.  Pulmonary:     Effort: Pulmonary effort is normal. No respiratory distress.     Breath sounds: Normal breath sounds. No stridor. No wheezing, rhonchi or rales.  Chest:     Chest wall: No tenderness.  Abdominal:     General: There is no distension.     Palpations: Abdomen is soft.     Tenderness: There is no  abdominal tenderness. There is no right CVA tenderness, left CVA tenderness or guarding.  Musculoskeletal:        General: No swelling, tenderness, deformity or signs of injury.     Right lower leg: Edema present.     Left lower leg: Edema present.     Comments: +2 pitting edema bilaterally Uses a cane for ambulation  Skin:    General: Skin is warm and dry.     Capillary Refill: Capillary refill takes less than 2 seconds.     Coloration: Skin is not jaundiced or pale.     Findings: No bruising, erythema or lesion.  Neurological:     Mental Status: She is alert and oriented to person,  place, and time.     Motor: No weakness.     Coordination: Coordination normal.     Gait: Gait normal.  Psychiatric:        Mood and Affect: Mood normal.        Behavior: Behavior normal.        Thought Content: Thought content normal.        Judgment: Judgment normal.     Assessment & Plan:   Problem List Items Addressed This Visit       Cardiovascular and Mediastinum   Hypertension   BP Readings from Last 3 Encounters:  01/17/24 (!) 140/48  12/22/21 (!) 147/56  12/17/21 (!) 116/53   Chronic hypertension managed with multiple antihypertensive medications. Blood pressure control is crucial given her history of stroke.  Medications managed by cardiology Continue amlodipine  10 mg daily, chlorthalidone  25 mg daily, hydralazine  50 mg twice daily, isosorbide  mononitrate 60 mg daily, metoprolol  50 mg twice daily, clonidine  0.2 mg twice daily Keep upcoming appointment with cardiology        Endocrine   Hypothyroidism   Hypothyroidism after thyroidectomy Post-thyroidectomy hypothyroidism managed with levothyroxine . Thyroid  function tests ordered to assess current status. - Review thyroid  function test results when available. - Adjust levothyroxine  dose if necessary based on lab results.       Relevant Orders   TSH + free T4     Musculoskeletal and Integument   Osteoarthritis    Osteoarthritis, status post knee replacement Osteoarthritis with a history of knee replacement. Manages pain with Tylenol  PM and uses a cane for mobility. - Continue Tylenol  PM as needed for pain. - Use cane for ambulation. - Ensure home safety to prevent falls.        Other   Hyperlipemia   Hyperlipidemia in patient with history of stroke Hyperlipidemia with a past stroke in 2019. Currently on atorvastatin . Recent cholesterol levels need review to ensure LDL is less than 55. - Request recent cholesterol levels from cardiologist. - Instruct to fast before next appointment for cholesterol check if not recently done.      History of CVA (cerebrovascular accident) - Primary   Continue aspirin  81 mg daily, atorvastatin  40 mg daily      Bilateral lower extremity edema   Lower extremity edema and shortness of breath Reports lower extremity edema and shortness of breath. - Use compression stockings. - Elevate legs when sitting. - Monitor salt intake. - Encourage use of leg cycling device for activity.      Healthcare maintenance   General Health Maintenance Reviewed preventative care measures including colon cancer screening, mammogram, and vaccinations.  Will get records from previous PCP and cardiology - Verify pneumococcal vaccine status with [facility]. - Encourage shingles and Tdap vaccines if not up to date. - Consider bone density scan as last done two years ago.         Outpatient Encounter Medications as of 01/17/2024  Medication Sig   ACETAMINOPHEN  ER PO Take 500 mg by mouth 2 (two) times daily as needed for pain.   amLODipine  (NORVASC ) 10 MG tablet Take 1 tablet (10 mg total) by mouth daily.   aspirin  81 MG tablet Take 1 tablet (81 mg total) by mouth daily.   atorvastatin  (LIPITOR ) 40 MG tablet SMARTSIG:1 Tablet(s) By Mouth Every Evening   chlorthalidone  (HYGROTON ) 25 MG tablet Take 1 tablet (25 mg total) by mouth daily.   cloNIDine  (CATAPRES ) 0.2 MG tablet Take 0.2 mg  by mouth 2 (two) times daily.  diphenhydramine-acetaminophen  (TYLENOL  PM) 25-500 MG TABS tablet Take 1-2 tablets by mouth at bedtime as needed (sleep).   hydrALAZINE  (APRESOLINE ) 50 MG tablet Take 1 tablet (50 mg total) by mouth 2 (two) times daily.   isosorbide  mononitrate (IMDUR ) 60 MG 24 hr tablet Take 1 tablet (60 mg total) by mouth daily.   metoprolol  tartrate (LOPRESSOR ) 50 MG tablet Take 1 tablet (50 mg total) by mouth 2 (two) times daily.   cholecalciferol (VITAMIN D3) 25 MCG (1000 UNIT) tablet Take 1,000 Units by mouth every other day. (Patient not taking: Reported on 01/17/2024)   hydrOXYzine (ATARAX) 25 MG tablet Take 25 mg by mouth as needed for itching. (Patient not taking: Reported on 01/17/2024)   lisinopril  (ZESTRIL ) 20 MG tablet Take 20 mg by mouth daily. (Patient not taking: Reported on 12/17/2021)   Omega-3 Fatty Acids (FISH OIL PO) Take 3 capsules by mouth daily. When able to remember (Patient not taking: Reported on 01/17/2024)   [DISCONTINUED] levothyroxine  (SYNTHROID ) 88 MCG tablet Take 1 tablet (88 mcg total) by mouth daily before breakfast. (Patient not taking: Reported on 01/17/2024)   [DISCONTINUED] Magnesium 400 MG TABS Take 400 mg by mouth daily. When able to remember (Patient not taking: Reported on 01/17/2024)   [DISCONTINUED] oxyCODONE  (OXY IR/ROXICODONE ) 5 MG immediate release tablet Take 1 tablet (5 mg total) by mouth every 6 (six) hours as needed for moderate pain.   [DISCONTINUED] oxyCODONE  (OXY IR/ROXICODONE ) 5 MG immediate release tablet Take 1-2 tablets (5-10 mg total) by mouth every 4 (four) hours as needed for moderate pain.   [DISCONTINUED] potassium chloride  (KLOR-CON ) 10 MEQ tablet Take 10 mEq by mouth daily. (Patient not taking: Reported on 01/17/2024)   No facility-administered encounter medications on file as of 01/17/2024.    Follow-up: Return in about 4 months (around 05/18/2024) for HTN, HYPERLIPIDEMIA.   Dallana Mavity R Atreus Hasz, FNP

## 2024-01-17 NOTE — Assessment & Plan Note (Signed)
 Osteoarthritis, status post knee replacement Osteoarthritis with a history of knee replacement. Manages pain with Tylenol  PM and uses a cane for mobility. - Continue Tylenol  PM as needed for pain. - Use cane for ambulation. - Ensure home safety to prevent falls.

## 2024-01-17 NOTE — Assessment & Plan Note (Signed)
 Hyperlipidemia in patient with history of stroke Hyperlipidemia with a past stroke in 2019. Currently on atorvastatin . Recent cholesterol levels need review to ensure LDL is less than 55. - Request recent cholesterol levels from cardiologist. - Instruct to fast before next appointment for cholesterol check if not recently done.

## 2024-01-17 NOTE — Assessment & Plan Note (Signed)
 General Health Maintenance Reviewed preventative care measures including colon cancer screening, mammogram, and vaccinations.  Will get records from previous PCP and cardiology - Verify pneumococcal vaccine status with [facility]. - Encourage shingles and Tdap vaccines if not up to date. - Consider bone density scan as last done two years ago.

## 2024-01-17 NOTE — Assessment & Plan Note (Signed)
 BP Readings from Last 3 Encounters:  01/17/24 (!) 140/48  12/22/21 (!) 147/56  12/17/21 (!) 116/53   Chronic hypertension managed with multiple antihypertensive medications. Blood pressure control is crucial given her history of stroke.  Medications managed by cardiology Continue amlodipine  10 mg daily, chlorthalidone  25 mg daily, hydralazine  50 mg twice daily, isosorbide  mononitrate 60 mg daily, metoprolol  50 mg twice daily, clonidine  0.2 mg twice daily Keep upcoming appointment with cardiology

## 2024-01-17 NOTE — Progress Notes (Signed)
 SABRA

## 2024-01-17 NOTE — Assessment & Plan Note (Signed)
 Hypothyroidism after thyroidectomy Post-thyroidectomy hypothyroidism managed with levothyroxine . Thyroid  function tests ordered to assess current status. - Review thyroid  function test results when available. - Adjust levothyroxine  dose if necessary based on lab results.

## 2024-01-17 NOTE — Assessment & Plan Note (Signed)
-   Continue aspirin 81 mg daily, atorvastatin 40 mg daily

## 2024-01-18 ENCOUNTER — Ambulatory Visit: Payer: Self-pay | Admitting: Nurse Practitioner

## 2024-01-18 DIAGNOSIS — E039 Hypothyroidism, unspecified: Secondary | ICD-10-CM

## 2024-01-18 LAB — TSH+FREE T4
Free T4: 1.63 ng/dL (ref 0.82–1.77)
TSH: 0.164 u[IU]/mL — ABNORMAL LOW (ref 0.450–4.500)

## 2024-01-18 MED ORDER — LEVOTHYROXINE SODIUM 100 MCG PO TABS: 100.0000 ug | ORAL_TABLET | Freq: Every day | ORAL | 1 refills | Status: DC

## 2024-01-18 NOTE — Telephone Encounter (Signed)
 Your thyroid  hormone is overcorrected, please start taking levothyroxine  100 mcg daily.  Follow-up in the office in 6 weeks for hypothyroidism.  Nurse please schedule an appointment

## 2024-01-25 DIAGNOSIS — I10 Essential (primary) hypertension: Secondary | ICD-10-CM | POA: Diagnosis not present

## 2024-01-25 DIAGNOSIS — R0602 Shortness of breath: Secondary | ICD-10-CM | POA: Diagnosis not present

## 2024-01-25 DIAGNOSIS — R072 Precordial pain: Secondary | ICD-10-CM | POA: Diagnosis not present

## 2024-05-18 ENCOUNTER — Ambulatory Visit (INDEPENDENT_AMBULATORY_CARE_PROVIDER_SITE_OTHER): Payer: Self-pay | Admitting: Nurse Practitioner

## 2024-05-18 ENCOUNTER — Encounter: Payer: Self-pay | Admitting: Nurse Practitioner

## 2024-05-18 VITALS — BP 160/70 | HR 96 | Wt 233.0 lb

## 2024-05-18 DIAGNOSIS — Z8673 Personal history of transient ischemic attack (TIA), and cerebral infarction without residual deficits: Secondary | ICD-10-CM | POA: Diagnosis not present

## 2024-05-18 DIAGNOSIS — E039 Hypothyroidism, unspecified: Secondary | ICD-10-CM | POA: Diagnosis not present

## 2024-05-18 DIAGNOSIS — Z23 Encounter for immunization: Secondary | ICD-10-CM | POA: Insufficient documentation

## 2024-05-18 DIAGNOSIS — R600 Localized edema: Secondary | ICD-10-CM

## 2024-05-18 DIAGNOSIS — E785 Hyperlipidemia, unspecified: Secondary | ICD-10-CM | POA: Diagnosis not present

## 2024-05-18 DIAGNOSIS — Z780 Asymptomatic menopausal state: Secondary | ICD-10-CM | POA: Diagnosis not present

## 2024-05-18 DIAGNOSIS — Z0000 Encounter for general adult medical examination without abnormal findings: Secondary | ICD-10-CM | POA: Diagnosis not present

## 2024-05-18 DIAGNOSIS — I10 Essential (primary) hypertension: Secondary | ICD-10-CM

## 2024-05-18 NOTE — Assessment & Plan Note (Signed)
 Hypothyroidism Previous thyroid  hormone levels slightly elevated, indicating possible need for dosage adjustment. - Ordered thyroid  function tests to assess current hormone levels. - Continue Levothyroxine  100 mcg daily until lab results are reviewed.

## 2024-05-18 NOTE — Assessment & Plan Note (Signed)
 General Health Maintenance Routine health maintenance discussed, including vaccinations and screenings. - Administered flu shot today. - Encouraged shingles and Tdap vaccines, available at pharmacy. - Ordered bone density scan to screen for osteoporosis. - Ensured mammogram is up to date; last done in June 2025. - Confirmed Cologuard test was completed in 2024.

## 2024-05-18 NOTE — Assessment & Plan Note (Signed)
 BP Readings from Last 3 Encounters:  05/18/24 (!) 160/70  01/17/24 (!) 140/48  12/22/21 (!) 147/56   Hypertension Blood pressure suboptimally controlled with elevated readings. Missed morning medication may have contributed. - Ensure antihypertensive medications taken 2-3 hours before appointments. - Continue current antihypertensive regimen: Amlodipine  5 mg daily, Chlorthalidone  25 mg daily, Hydralazine  50 mg twice daily, Isosorbide  mononitrate 60 mg daily, Metoprolol  50 mg twice daily, Clonidine  0.2 mg twice daily.  Medications managed by cardiologist - Encouraged low salt, low fat diet and regular exercise as tolerated.

## 2024-05-18 NOTE — Progress Notes (Signed)
 "  Established Patient Office Visit  Subjective:  Patient ID: Susan Nichols, female    DOB: [birth year]  Age: 74 y.o. MRN: [id number]  CC:  Chief Complaint  Patient presents with   Hyperlipidemia   Hypertension   Hypothyroidism    HPI    Discussed the use of AI scribe software for clinical note transcription with the patient, who gave verbal consent to proceed.  History of Present Illness Susan Nichols is a 74 year old female   has a past medical history of Anemia, Coronary artery disease, Dyspnea, Hyperlipemia, Hypertension, Lower extremity edema, Multinodular goiter, and Stroke (HCC).  who presents for a follow-up appointment. She is accompanied by her daughter   She did not take her morning blood pressure medication before the visit, which she usually takes around 8:00 to 8:30 AM. She has been monitoring her blood pressure at home, with a recent reading of 140 mmHg. She is currently taking amlodipine  5 mg, chlorthalidone  25 mg daily, hydralazine  50 mg twice daily, isosorbide  mononitrate 60 mg daily, metoprolol  50 mg twice daily, clonidine  0.2 mg twice daily, and baby aspirin  81 mg daily. She was previously on lisinopril , which was discontinued.  She has a history of thyroid  disorder and is taking levothyroxine  100 mcg daily. She is running low on her thyroid  medication and has taken it today. Her thyroid  hormone levels were noted to be high during her last lab work.  In terms of her cardiovascular health, she is on atorvastatin  40 mg daily. She experiences shortness of breath upon exertion and some swelling, which is managed with compression stockings, although she finds them difficult to put on without assistance. She spends a lot of time sitting and has a recliner but does not use it often to elevate her legs.  No chest pain. She experiences shortness of breath when rushing and some swelling. She has not received her flu shot yet. She had a mammogram in June of last year and a  Cologuard test in 2024.   Assessment & Plan    Past Medical History:  Diagnosis Date   Anemia    Coronary artery disease    Dyspnea    Hyperlipemia    Hypertension    Lower extremity edema    Multinodular goiter    causing tracheal deviation   Stroke [facility])     Past Surgical History:  Procedure Laterality Date   CHOLECYSTECTOMY N/A 10/08/2021   Procedure: LAPAROSCOPIC CHOLECYSTECTOMY WITH INTRAOPERATIVE CHOLANGIOGRAM;  Surgeon: Vernetta Berg, MD;  Location: [facility];  Service: General;  Laterality: N/A;   THYROIDECTOMY N/A 12/21/2021   Procedure: TOTAL THYROIDECTOMY;  Surgeon: Eletha Boas, MD;  Location: [facility];  Service: General;  Laterality: N/A;   TOTAL KNEE ARTHROPLASTY  05/10/2005   right knee   TUBAL LIGATION      Family History  Problem Relation Age of Onset   Stroke Mother    Hypertension Mother    Hyperlipidemia Mother    Hypertension Father    Hyperlipidemia Father    Stroke Brother     Social History   Socioeconomic History   Marital status: Married    Spouse name: Not on file   Number of children: 1   Years of education: Not on file   Highest education level: 12th grade  Occupational History   Not on file  Tobacco Use   Smoking status: Never   Smokeless tobacco: Never  Vaping Use   Vaping status: Never Used  Substance and Sexual Activity  Alcohol use: No   Drug use: No   Sexual activity: Not on file  Other Topics Concern   Not on file  Social History Narrative   Lives at home with her husband.   Daughter is a CHARITY FUNDRAISER   Social Drivers of Health   Tobacco Use: Low Risk (05/18/2024)   Patient History    Smoking Tobacco Use: Never    Smokeless Tobacco Use: Never    Passive Exposure: Not on file  Financial Resource Strain: Low Risk (05/17/2024)   Overall Financial Resource Strain (CARDIA)    Difficulty of Paying Living Expenses: Not hard at all  Food Insecurity: No Food Insecurity (05/17/2024)   Epic    Worried About Programme Researcher, Broadcasting/film/video in the  Last Year: Never true    Ran Out of Food in the Last Year: Never true  Transportation Needs: No Transportation Needs (05/17/2024)   Epic    Lack of Transportation (Medical): No    Lack of Transportation (Non-Medical): No  Physical Activity: Inactive (05/17/2024)   Exercise Vital Sign    Days of Exercise per Week: 0 days    Minutes of Exercise per Session: Not on file  Stress: No Stress Concern Present (05/17/2024)   [employer] of Occupational Health - Occupational Stress Questionnaire    Feeling of Stress: Only a little  Social Connections: Moderately Integrated (05/17/2024)   Social Connection and Isolation Panel    Frequency of Communication with Friends and Family: More than three times a week    Frequency of Social Gatherings with Friends and Family: Once a week    Attends Religious Services: 1 to 4 times per year    Active Member of [employer] or Organizations: No    Attends Banker Meetings: Not on file    Marital Status: Married  Catering Manager Violence: Not At Risk (05/18/2024)   Epic    Fear of Current or Ex-Partner: No    Emotionally Abused: No    Physically Abused: No    Sexually Abused: No  Depression (PHQ2-9): Low Risk (05/18/2024)   Depression (PHQ2-9)    PHQ-2 Score: 0  Alcohol Screen: Not on file  Housing: Low Risk (05/17/2024)   Epic    Unable to Pay for Housing in the Last Year: No    Number of Times Moved in the Last Year: 0    Homeless in the Last Year: No  Utilities: Not At Risk (05/18/2024)   Epic    Threatened with loss of utilities: No  Health Literacy: Not on file    Outpatient Medications Prior to Visit  Medication Sig Dispense Refill   ACETAMINOPHEN  ER PO Take 500 mg by mouth 2 (two) times daily as needed for pain.     amLODipine  (NORVASC ) 10 MG tablet Take 1 tablet (10 mg total) by mouth daily. 30 tablet 3   aspirin  81 MG tablet Take 1 tablet (81 mg total) by mouth daily. 30 tablet 0   atorvastatin  (LIPITOR ) 40 MG tablet SMARTSIG:1 Tablet(s)  By Mouth Every Evening     chlorthalidone  (HYGROTON ) 25 MG tablet Take 1 tablet (25 mg total) by mouth daily. 30 tablet 5   cloNIDine  (CATAPRES ) 0.2 MG tablet Take 0.2 mg by mouth 2 (two) times daily.     diphenhydramine-acetaminophen  (TYLENOL  PM) 25-500 MG TABS tablet Take 1-2 tablets by mouth at bedtime as needed (sleep).     hydrALAZINE  (APRESOLINE ) 50 MG tablet Take 1 tablet (50 mg total) by mouth 2 (two) times  daily. 60 tablet 5   isosorbide  mononitrate (IMDUR ) 60 MG 24 hr tablet Take 1 tablet (60 mg total) by mouth daily. 30 tablet 5   levothyroxine  (SYNTHROID ) 100 MCG tablet Take 1 tablet (100 mcg total) by mouth daily. 60 tablet 1   MAGNESIUM PO Take by mouth.     metoprolol  tartrate (LOPRESSOR ) 50 MG tablet Take 1 tablet (50 mg total) by mouth 2 (two) times daily. 60 tablet 3   Multiple Vitamin (MULTIVITAMIN WITH MINERALS) TABS tablet Take 1 tablet by mouth daily.     cholecalciferol (VITAMIN D3) 25 MCG (1000 UNIT) tablet Take 1,000 Units by mouth every other day. (Patient not taking: Reported on 05/18/2024)     hydrOXYzine (ATARAX) 25 MG tablet Take 25 mg by mouth as needed for itching. (Patient not taking: Reported on 05/18/2024)     Omega-3 Fatty Acids (FISH OIL PO) Take 3 capsules by mouth daily. When able to remember (Patient not taking: Reported on 05/18/2024)     lisinopril  (ZESTRIL ) 20 MG tablet Take 20 mg by mouth daily. (Patient not taking: Reported on 05/18/2024)     No facility-administered medications prior to visit.    Allergies[1]  ROS Review of Systems  Constitutional:  Negative for appetite change, chills, fatigue and fever.  HENT:  Negative for congestion, postnasal drip, rhinorrhea and sneezing.   Respiratory:  Negative for cough, shortness of breath and wheezing.   Cardiovascular:  Positive for leg swelling. Negative for chest pain and palpitations.  Gastrointestinal:  Negative for abdominal pain, constipation, nausea and vomiting.  Genitourinary:  Negative for  difficulty urinating, dysuria, flank pain and frequency.  Musculoskeletal:  Negative for arthralgias, back pain, joint swelling and myalgias.  Skin:  Negative for color change, pallor, rash and wound.  Neurological:  Negative for facial asymmetry, weakness, numbness and headaches.  Psychiatric/Behavioral:  Negative for behavioral problems, confusion, self-injury and suicidal ideas.       Objective:    Physical Exam Vitals and nursing note reviewed.  Constitutional:      General: She is not in acute distress.    Appearance: Normal appearance. She is obese. She is not ill-appearing, toxic-appearing or diaphoretic.  Eyes:     General: No scleral icterus.       Right eye: No discharge.        Left eye: No discharge.     Extraocular Movements: Extraocular movements intact.     Conjunctiva/sclera: Conjunctivae normal.  Cardiovascular:     Rate and Rhythm: Normal rate and regular rhythm.     Pulses: Normal pulses.     Heart sounds: Normal heart sounds. No murmur heard.    No friction rub. No gallop.  Pulmonary:     Effort: Pulmonary effort is normal. No respiratory distress.     Breath sounds: Normal breath sounds. No stridor. No wheezing, rhonchi or rales.  Chest:     Chest wall: No tenderness.  Abdominal:     General: There is no distension.     Palpations: Abdomen is soft.     Tenderness: There is no abdominal tenderness. There is no right CVA tenderness, left CVA tenderness or guarding.  Musculoskeletal:        General: No swelling, tenderness, deformity or signs of injury.     Right lower leg: Edema present.     Left lower leg: Edema present.     Comments: Sitting in a wheelchair  Skin:    General: Skin is warm and dry.  Capillary Refill: Capillary refill takes less than 2 seconds.     Coloration: Skin is not jaundiced or pale.     Findings: No bruising, erythema or lesion.  Neurological:     Mental Status: She is alert and oriented to person, place, and time.   Psychiatric:        Mood and Affect: Mood normal.        Behavior: Behavior normal.        Thought Content: Thought content normal.        Judgment: Judgment normal.     BP (!) 160/70   Pulse 96   Wt 233 lb (105.7 kg)   SpO2 99%   BMI 39.99 kg/m  Wt Readings from Last 3 Encounters:  05/18/24 233 lb (105.7 kg)  01/17/24 232 lb (105.2 kg)  12/21/21 199 lb 1.2 oz (90.3 kg)    Lab Results  Component Value Date   TSH 0.164 (L) 01/17/2024   Lab Results  Component Value Date   WBC 4.8 12/17/2021   HGB 9.6 (L) 12/17/2021   HCT 30.3 (L) 12/17/2021   MCV 88.9 12/17/2021   PLT 285 12/17/2021   Lab Results  Component Value Date   NA 137 12/22/2021   K 4.2 12/22/2021   CHLORIDE 108 04/24/2014   CO2 25 12/22/2021   GLUCOSE 121 (H) 12/22/2021   BUN 25 (H) 12/22/2021   CREATININE 1.06 (H) 12/22/2021   BILITOT 0.8 10/09/2021   ALKPHOS 246 (H) 10/09/2021   AST 118 (H) 10/09/2021   ALT 199 (H) 10/09/2021   PROT 6.9 10/09/2021   ALBUMIN 3.2 (L) 10/09/2021   CALCIUM  9.9 12/22/2021   ANIONGAP 7 12/22/2021   EGFR 52 (L) 04/24/2014   Lab Results  Component Value Date   CHOL 113 11/25/2017   Lab Results  Component Value Date   HDL 35 (L) 11/25/2017   Lab Results  Component Value Date   LDLCALC 58 11/25/2017   Lab Results  Component Value Date   TRIG 34 10/05/2021   Lab Results  Component Value Date   CHOLHDL 3.2 11/25/2017   Lab Results  Component Value Date   HGBA1C 5.9 (H) 11/25/2017      Assessment & Plan:   Problem List Items Addressed This Visit       Cardiovascular and Mediastinum   Hypertension   BP Readings from Last 3 Encounters:  05/18/24 (!) 160/70  01/17/24 (!) 140/48  12/22/21 (!) 147/56   Hypertension Blood pressure suboptimally controlled with elevated readings. Missed morning medication may have contributed. - Ensure antihypertensive medications taken 2-3 hours before appointments. - Continue current antihypertensive regimen:  Amlodipine  5 mg daily, Chlorthalidone  25 mg daily, Hydralazine  50 mg twice daily, Isosorbide  mononitrate 60 mg daily, Metoprolol  50 mg twice daily, Clonidine  0.2 mg twice daily.  Medications managed by cardiologist - Encouraged low salt, low fat diet and regular exercise as tolerated.       Relevant Orders   CMP14+EGFR   CBC     Endocrine   Hypothyroidism - Primary   Hypothyroidism Previous thyroid  hormone levels slightly elevated, indicating possible need for dosage adjustment. - Ordered thyroid  function tests to assess current hormone levels. - Continue Levothyroxine  100 mcg daily until lab results are reviewed.        Relevant Orders   TSH + free T4     Other   Hyperlipemia   Hyperlipidemia No recent lipid panel available to assess current status. - Ordered lipid panel to assess  current cholesterol levels. - Continue Atorvastatin  40 mg daily.       Relevant Orders   Lipid panel   History of CVA (cerebrovascular accident)   Continue aspirin  81 mg daily, atorvastatin  40 mg daily      Bilateral lower extremity edema   DASH diet, elevation use of compression socks advised      Healthcare maintenance   General Health Maintenance Routine health maintenance discussed, including vaccinations and screenings. - Administered flu shot today. - Encouraged shingles and Tdap vaccines, available at pharmacy. - Ordered bone density scan to screen for osteoporosis. - Ensured mammogram is up to date; last done in June 2025. - Confirmed Cologuard test was completed in 2024.      Need for influenza vaccination   Relevant Orders   Flu vaccine HIGH DOSE PF(Fluzone Trivalent) (Completed)   RESOLVED: Health care maintenance   Other Visit Diagnoses       Post-menopausal       Relevant Orders   DG Bone Density       No orders of the defined types were placed in this encounter.   Follow-up: Return in about 4 months (around 09/15/2024) for HTN, HYPERLIPIDEMIA.    Jovanni Eckhart  R Trissa Molina, FNP     [1] No Known Allergies  "

## 2024-05-18 NOTE — Assessment & Plan Note (Signed)
 Hyperlipidemia No recent lipid panel available to assess current status. - Ordered lipid panel to assess current cholesterol levels. - Continue Atorvastatin  40 mg daily.

## 2024-05-18 NOTE — Patient Instructions (Addendum)
 For the swelling in your lower extremities, be sure to elevate your legs when able, mind the salt intake, stay physically active and consider wearing compression stockings.   Please Call this number to schedule your bone density scan  [facility]  Address: [address] [place], [place] [address] Phone: [phone]    1. Hypothyroidism, unspecified type (Primary) - TSH + free T4  2. Primary hypertension - CMP14+EGFR - CBC  3. Hyperlipidemia, unspecified hyperlipidemia type - Lipid panel  4. Need for influenza vaccination  5. Post-menopausal - DG Bone Density; Future    It is important that you exercise regularly at least 30 minutes 5 times a week as tolerated  Think about what you will eat, plan ahead. Choose  clean, green, fresh or frozen over canned, processed or packaged foods which are more sugary, salty and fatty. 70 to 75% of food eaten should be vegetables and fruit. Three meals at set times with snacks allowed between meals, but they must be fruit or vegetables. Aim to eat over a 12 hour period , example 7 am to 7 pm, and STOP after  your last meal of the day. Drink water,generally about 64 ounces per day, no other drink is as healthy. Fruit juice is best enjoyed in a healthy way, by EATING the fruit.  Thanks for choosing [facility] we consider it a privelige to serve you.

## 2024-05-18 NOTE — Assessment & Plan Note (Signed)
 DASH diet, elevation use of compression socks advised

## 2024-05-18 NOTE — Assessment & Plan Note (Signed)
"  Continue aspirin  81 mg daily, atorvastatin  40 mg daily  "

## 2024-05-19 ENCOUNTER — Other Ambulatory Visit: Payer: Self-pay | Admitting: Nurse Practitioner

## 2024-05-19 ENCOUNTER — Ambulatory Visit: Payer: Self-pay | Admitting: Nurse Practitioner

## 2024-05-19 DIAGNOSIS — E785 Hyperlipidemia, unspecified: Secondary | ICD-10-CM

## 2024-05-19 DIAGNOSIS — E039 Hypothyroidism, unspecified: Secondary | ICD-10-CM

## 2024-05-19 DIAGNOSIS — Z8673 Personal history of transient ischemic attack (TIA), and cerebral infarction without residual deficits: Secondary | ICD-10-CM

## 2024-05-19 LAB — LIPID PANEL
Chol/HDL Ratio: 3 ratio (ref 0.0–4.4)
Cholesterol, Total: 141 mg/dL (ref 100–199)
HDL: 47 mg/dL
LDL Chol Calc (NIH): 75 mg/dL (ref 0–99)
Triglycerides: 106 mg/dL (ref 0–149)
VLDL Cholesterol Cal: 19 mg/dL (ref 5–40)

## 2024-05-19 LAB — CMP14+EGFR
ALT: 13 IU/L (ref 0–32)
AST: 21 IU/L (ref 0–40)
Albumin: 4.1 g/dL (ref 3.8–4.8)
Alkaline Phosphatase: 101 IU/L (ref 49–135)
BUN/Creatinine Ratio: 17 (ref 12–28)
BUN: 20 mg/dL (ref 8–27)
Bilirubin Total: 0.7 mg/dL (ref 0.0–1.2)
CO2: 22 mmol/L (ref 20–29)
Calcium: 9.8 mg/dL (ref 8.7–10.3)
Chloride: 104 mmol/L (ref 96–106)
Creatinine, Ser: 1.18 mg/dL — ABNORMAL HIGH (ref 0.57–1.00)
Globulin, Total: 3.1 g/dL (ref 1.5–4.5)
Glucose: 103 mg/dL — ABNORMAL HIGH (ref 70–99)
Potassium: 4.1 mmol/L (ref 3.5–5.2)
Sodium: 139 mmol/L (ref 134–144)
Total Protein: 7.2 g/dL (ref 6.0–8.5)
eGFR: 48 mL/min/1.73 — ABNORMAL LOW

## 2024-05-19 LAB — TSH+FREE T4
Free T4: 1.64 ng/dL (ref 0.82–1.77)
TSH: 2.37 u[IU]/mL (ref 0.450–4.500)

## 2024-05-19 LAB — CBC
Hematocrit: 35.5 % (ref 34.0–46.6)
Hemoglobin: 11.6 g/dL (ref 11.1–15.9)
MCH: 28.5 pg (ref 26.6–33.0)
MCHC: 32.7 g/dL (ref 31.5–35.7)
MCV: 87 fL (ref 79–97)
Platelets: 276 x10E3/uL (ref 150–450)
RBC: 4.07 x10E6/uL (ref 3.77–5.28)
RDW: 14.4 % (ref 11.7–15.4)
WBC: 5.4 x10E3/uL (ref 3.4–10.8)

## 2024-05-19 MED ORDER — LEVOTHYROXINE SODIUM 100 MCG PO TABS: 100.0000 ug | ORAL_TABLET | Freq: Every day | ORAL | 1 refills | Status: AC

## 2024-05-19 MED ORDER — ATORVASTATIN CALCIUM 40 MG PO TABS: 40.0000 mg | ORAL_TABLET | Freq: Every day | ORAL | 1 refills | Status: AC

## 2024-06-12 ENCOUNTER — Encounter: Payer: Self-pay | Admitting: *Deleted

## 2024-09-14 ENCOUNTER — Ambulatory Visit: Payer: Self-pay | Admitting: Nurse Practitioner
